# Patient Record
Sex: Male | Born: 1945
Health system: Southern US, Community
[De-identification: ages and names within clinical notes are randomized; demographics above are authoritative.]

## PROBLEM LIST (undated history)

## (undated) DIAGNOSIS — J449 Chronic obstructive pulmonary disease, unspecified: Secondary | ICD-10-CM

## (undated) DIAGNOSIS — N401 Enlarged prostate with lower urinary tract symptoms: Secondary | ICD-10-CM

## (undated) DIAGNOSIS — I739 Peripheral vascular disease, unspecified: Secondary | ICD-10-CM

## (undated) DIAGNOSIS — E785 Hyperlipidemia, unspecified: Secondary | ICD-10-CM

## (undated) DIAGNOSIS — N529 Male erectile dysfunction, unspecified: Secondary | ICD-10-CM

## (undated) DIAGNOSIS — F419 Anxiety disorder, unspecified: Secondary | ICD-10-CM

## (undated) DIAGNOSIS — T82519A Breakdown (mechanical) of unspecified cardiac and vascular devices and implants, initial encounter: Secondary | ICD-10-CM

## (undated) DIAGNOSIS — R0609 Other forms of dyspnea: Secondary | ICD-10-CM

## (undated) DIAGNOSIS — Z9861 Coronary angioplasty status: Principal | ICD-10-CM

## (undated) DIAGNOSIS — F411 Generalized anxiety disorder: Secondary | ICD-10-CM

## (undated) DIAGNOSIS — G4700 Insomnia, unspecified: Secondary | ICD-10-CM

## (undated) DIAGNOSIS — I69398 Other sequelae of cerebral infarction: Secondary | ICD-10-CM

## (undated) DIAGNOSIS — I10 Essential (primary) hypertension: Secondary | ICD-10-CM

## (undated) DIAGNOSIS — R972 Elevated prostate specific antigen [PSA]: Secondary | ICD-10-CM

## (undated) DIAGNOSIS — R3912 Poor urinary stream: Secondary | ICD-10-CM

## (undated) DIAGNOSIS — F09 Unspecified mental disorder due to known physiological condition: Secondary | ICD-10-CM

## (undated) DIAGNOSIS — J439 Emphysema, unspecified: Secondary | ICD-10-CM

## (undated) DIAGNOSIS — F32A Depression, unspecified: Secondary | ICD-10-CM

## (undated) DIAGNOSIS — F329 Major depressive disorder, single episode, unspecified: Secondary | ICD-10-CM

## (undated) DIAGNOSIS — I2510 Atherosclerotic heart disease of native coronary artery without angina pectoris: Secondary | ICD-10-CM

## (undated) DIAGNOSIS — I639 Cerebral infarction, unspecified: Secondary | ICD-10-CM

## (undated) DIAGNOSIS — Z955 Presence of coronary angioplasty implant and graft: Secondary | ICD-10-CM

## (undated) DIAGNOSIS — I219 Acute myocardial infarction, unspecified: Secondary | ICD-10-CM

## (undated) DIAGNOSIS — M1990 Unspecified osteoarthritis, unspecified site: Secondary | ICD-10-CM

## (undated) HISTORY — DX: Atherosclerotic heart disease of native coronary artery without angina pectoris: I25.10

## (undated) HISTORY — DX: Hyperlipidemia, unspecified: E78.5

## (undated) HISTORY — PX: PROSTATE BIOPSY: SHX241

## (undated) HISTORY — DX: Chronic obstructive pulmonary disease, unspecified: J44.9

## (undated) HISTORY — DX: Peripheral vascular disease, unspecified: I73.9

## (undated) HISTORY — PX: OTHER SURGICAL HISTORY: SHX169

## (undated) HISTORY — DX: Essential (primary) hypertension: I10

## (undated) HISTORY — PX: BACK SURGERY: SHX140

## (undated) HISTORY — DX: Coronary angioplasty status: Z98.61

---

## 1996-09-04 HISTORY — PX: CATARACT EXTRACTION W/ INTRAOCULAR LENS IMPLANT: SHX1309

## 1997-09-04 DIAGNOSIS — I739 Peripheral vascular disease, unspecified: Secondary | ICD-10-CM

## 1997-09-04 HISTORY — DX: Peripheral vascular disease, unspecified: I73.9

## 1998-04-30 ENCOUNTER — Ambulatory Visit: Admission: RE | Admit: 1998-04-30 | Discharge: 1998-04-30 | Payer: Self-pay | Admitting: Vascular Surgery

## 1998-04-30 HISTORY — PX: ANGIOPLASTY / STENTING ILIAC: SUR31

## 1998-09-04 DIAGNOSIS — I739 Peripheral vascular disease, unspecified: Secondary | ICD-10-CM

## 1998-09-04 HISTORY — PX: EYE SURGERY: SHX253

## 1998-09-04 HISTORY — DX: Peripheral vascular disease, unspecified: I73.9

## 1999-09-05 HISTORY — PX: OTHER SURGICAL HISTORY: SHX169

## 2000-11-27 ENCOUNTER — Encounter: Payer: Self-pay | Admitting: Orthopedic Surgery

## 2000-11-27 ENCOUNTER — Encounter: Admission: RE | Admit: 2000-11-27 | Discharge: 2000-11-27 | Payer: Self-pay | Admitting: Orthopedic Surgery

## 2001-02-05 ENCOUNTER — Encounter: Payer: Self-pay | Admitting: Orthopedic Surgery

## 2001-02-05 ENCOUNTER — Encounter: Admission: RE | Admit: 2001-02-05 | Discharge: 2001-02-05 | Payer: Self-pay | Admitting: Orthopedic Surgery

## 2001-02-18 ENCOUNTER — Encounter: Payer: Self-pay | Admitting: Orthopedic Surgery

## 2001-02-18 ENCOUNTER — Encounter: Admission: RE | Admit: 2001-02-18 | Discharge: 2001-02-18 | Payer: Self-pay | Admitting: Orthopedic Surgery

## 2005-01-02 DIAGNOSIS — I252 Old myocardial infarction: Secondary | ICD-10-CM

## 2005-01-02 DIAGNOSIS — I2510 Atherosclerotic heart disease of native coronary artery without angina pectoris: Secondary | ICD-10-CM

## 2005-01-02 HISTORY — DX: Old myocardial infarction: I25.2

## 2005-01-02 HISTORY — DX: Atherosclerotic heart disease of native coronary artery without angina pectoris: I25.10

## 2005-01-09 ENCOUNTER — Inpatient Hospital Stay (HOSPITAL_COMMUNITY): Admission: EM | Admit: 2005-01-09 | Discharge: 2005-01-12 | Payer: Self-pay | Admitting: Emergency Medicine

## 2005-01-10 HISTORY — PX: CARDIAC CATHETERIZATION: SHX172

## 2005-01-10 HISTORY — PX: CORONARY ANGIOPLASTY WITH STENT PLACEMENT: SHX49

## 2005-03-31 ENCOUNTER — Encounter: Admission: RE | Admit: 2005-03-31 | Discharge: 2005-03-31 | Payer: Self-pay | Admitting: Cardiovascular Disease

## 2005-04-04 ENCOUNTER — Ambulatory Visit (HOSPITAL_COMMUNITY): Admission: RE | Admit: 2005-04-04 | Discharge: 2005-04-05 | Payer: Self-pay | Admitting: Cardiovascular Disease

## 2005-04-04 DIAGNOSIS — I2119 ST elevation (STEMI) myocardial infarction involving other coronary artery of inferior wall: Secondary | ICD-10-CM

## 2005-04-04 HISTORY — DX: ST elevation (STEMI) myocardial infarction involving other coronary artery of inferior wall: I21.19

## 2005-04-04 HISTORY — PX: OTHER SURGICAL HISTORY: SHX169

## 2007-06-03 HISTORY — PX: NM MYOCAR PERF EJECTION FRACTION: HXRAD630

## 2007-07-17 HISTORY — PX: CARDIAC CATHETERIZATION: SHX172

## 2007-07-17 HISTORY — PX: OTHER SURGICAL HISTORY: SHX169

## 2010-09-04 DIAGNOSIS — I6990 Unspecified sequelae of unspecified cerebrovascular disease: Secondary | ICD-10-CM

## 2010-09-04 DIAGNOSIS — I6930 Unspecified sequelae of cerebral infarction: Secondary | ICD-10-CM

## 2010-09-04 HISTORY — DX: Unspecified sequelae of unspecified cerebrovascular disease: I69.90

## 2010-09-04 HISTORY — DX: Unspecified sequelae of cerebral infarction: I69.30

## 2011-01-20 NOTE — Discharge Summary (Signed)
NAME:  Terry Kemp, Terry Kemp            ACCOUNT NO.:  [id number]   MEDICAL RECORD NO.:  [id number]          PATIENT TYPE:  INP   LOCATION:  2039                         FACILITY:  [facility]   PHYSICIAN:  Madaline Savage, M.D.DATE OF BIRTH:  [birth year]   DATE OF ADMISSION:  01/09/2005  DATE OF DISCHARGE:  01/12/2005                                 DISCHARGE SUMMARY   DISCHARGE DIAGNOSES:  1.  DMI treated with RCA Cypher stent this admission.  2.  Residual 50% left main lesion.  3.  Normal left ventricular function.  4.  Peripheral vascular disease with prior bilateral iliac stents placed by      Dr. Edilia Bo in 2000 and 2001 with total in-stent restenosis on the right      and 75% in-stent restenosis on the left.  5.  Dyslipidemia.  6.  Former smoker.   HOSPITAL COURSE:  The patient is a 65 year old male who was admitted to  [facility] by Dr. Effie Shy Jan 09, 2005 with an acute DMI.  He was  started on IV heparin, Integrilin, aspirin, and nitrates, and taken urgently  to the cath lab at 2:00 a.m. by Dr. gamble.  Catheterization revealed normal  renal arteries.  He had a total right iliac in-stent restenosis and a 75%  left iliac in-stent restenosis.  He had 50% mid-distal left main narrowing  with a normal LAD and circumflex.  The RCA was totaled mid-vessel.  This was  dilated and stented with a Cypher stent with good results.  EF was 60%.  His  enzymes peaked at 1941. He had some nonsustained V-tach which we felt was  reperfusion arrhythmia.  We did add a beta blocker, although he did have  bradycardia on admission.  He does tolerate his beta-blocker.  He was  transferred to the CCU and ambulated.  He was anxious for discharge on Jan 12, 2005.  He had been on lovastatin prior to admission.  His LDL was in the  50s but his HDL was 26 during this hospitalization. We initially wanted to  put him on niacin and change his lovastatin to Crestor.  He brought in some  lipid panels from  previous checks as an outpatient and his LDL was 140 and  his HDL was in the mid-40s. For now, we will leave him off Niaspan and just  put him on Crestor 20 mg a day and follow this up as an outpatient.  He will  also need an outpatient Cardiolite at some point to assess his left main  narrowing. The patient wants to contact Dr. Edilia Bo regarding his peripheral  vascular disease.   DISCHARGE MEDICATIONS:  1.  Pletal 50 mg b.i.d.  2.  Plavix 75 mg a day.  3.  Aspirin 81 mg a day.  4.  Crestor 20 mg a day.  5.  Altace 2.5 mg a day.  6.  Toprol XL 25 mg once a day.   LABORATORY DATA:  CKs peaked at 1941 with 231 MB.  Renal function shows a  sodium of 136, potassium 3.8, BUN 4, creatinine 0.9.  Hematology shows  a  white count of 8, hemoglobin 13.1, hematocrit 38.2, platelets 201,000.  TSH  1.24.  PSA 0.37.  A portable chest x-ray shows low lung volumes, no acute  findings.  INR 1.0.  EKG:  Sinus rhythm, inferior Q-waves and T-wave  inversion.   DISPOSITION:  The patient is discharged in stable condition.  He will follow  up with Dr. Elsie Lincoln Jan 27, 2005 at 4:15 p.m.  He will contact Dr. Adele Dan  office.      LKK/MEDQ  D:  01/12/2005  T:  01/12/2005  Job:  [id number]   cc:   Madaline Savage, M.D.  [address], [address]  [place]  [place] [address]  Fax: [phone]   Di Kindle. Edilia Bo, M.D.  [address]  [place]  [place] [address]   Chales Salmon. Abigail Miyamoto, M.D.  [address]  [place]  [place] [address]  Fax: [phone]

## 2011-01-20 NOTE — Discharge Summary (Signed)
NAME:  AZAREL, BANNER NO.:  [id number]   MEDICAL RECORD NO.:  [id number]          PATIENT TYPE:  INP   LOCATION:  2039                         FACILITY:  [facility]   PHYSICIAN:  Madaline Savage, M.D.DATE OF BIRTH:  [birth year]   DATE OF ADMISSION:  01/09/2005  DATE OF DISCHARGE:  01/12/2005                                 DISCHARGE SUMMARY   ADDENDUM:  At discharge,  Mr. Furness spoke with Dr. Alanda Amass.  Dr.  Alanda Amass suggested that the patient not go to his son's graduation at  [facility], but the patient had decided to go anyway. We also suggested  he have outpatient cardiac rehab, and the patient also declined this. Dr.  Alanda Amass did adjust his Toprol XL to half a tablet a day, that will be 12.5  mg a day.  Dr. Alanda Amass also talked with him about his peripheral vascular  disease, and the patient would like to see [place]. Dr. Alanda Amass will with Dr.  Edilia Bo regarding this. We will arrange him to have an outpatient carotid  artery Dopplers, Cardiolite, and lower extremity arterial Dopplers.      LKK/MEDQ  D:  01/12/2005  T:  01/12/2005  Job:  [id number]   cc:   Di Kindle. Edilia Bo, M.D.  [address]  [place]  [place] [address]

## 2011-01-20 NOTE — H&P (Signed)
NAME:  [name], [name] NO.:  [id number]   MEDICAL RECORD NO.:  [id number]          PATIENT TYPE:  INP   LOCATION:  1827                         FACILITY:  [facility]   PHYSICIAN:  Terry Amor, MD DATE OF BIRTH:  [birth year]   DATE OF ADMISSION:  01/09/2005  DATE OF DISCHARGE:                                HISTORY & PHYSICAL   REASON FOR ADMISSION:  Terry Kemp is a 65 year old white male who was  admitted to [facility] for an acute inferior myocardial  infarction.   HISTORY OF PRESENT ILLNESS:  The patient, who has no past history of cardiac  disease, presented to the emergency department with a three-hour history of  chest pain. This began while he was sedentary at home. The chest pain is  described as a substernal ache. It does not radiate. It is associated with  diaphoresis but no dyspnea or nausea. There were no exacerbating or  ameliorating factors. The chest pain appears to be unrelated position,  activity, meals, or respirations. Despite morphine and intravenous  nitroglycerin administered upon his arrival in the emergency department, his  chest pain continues at a severity of approximately 7 out of 10.   As noted, the patient has no past history of cardiac disease including no  history of chest pain, myocardial infarction, coronary artery disease,  congestive heart failure, or arrhythmia. He has a number of risk factors for  coronary artery disease including dyslipidemia and smoking (he discontinued  smoking several months ago). There was no history of diabetes mellitus,  hypertension, or family history of early coronary artery disease.   The patient's only other medical problem is that of peripheral vascular  disease. He underwent stent placement a number of years ago at what sounds  like the aortobifemoral junction.   MEDICATIONS:  Cilostazol and Lovastatin. He is also on aspirin.   ALLERGIES:  None.   PAST SURGICAL  HISTORY:  None.   INJURIES:  None.   SOCIAL HISTORY:  The patient is a retired Forensic scientist.   FAMILY HISTORY:  His father, who is deceased, has an unknown medical  history. His mother died of cancer.   REVIEW OF SYMPTOMS:  Cursory review of systems revealed no problems related  to his head, eyes, ears, nose, mouth, throat, lungs, gastrointestinal  system, genitourinary system, or extremities. There was no history of  neurologic or psychiatric disorder. There is no history of fever, chills, or  weight loss.   PHYSICAL EXAMINATION:  VITAL SIGNS:  Blood pressure 124/84, pulse 56 and  regular. The patient was a middle-aged white male in mild discomfort. He was  alert, oriented, appropriate, and responsive.  HEENT:  Normal.  NECK:  Without thyromegaly or adenopathy. Carotid pulses were palpable  bilaterally and without bruits.  CARDIOVASCULAR:  Normal S1 and S2. There was no S3, S4, murmur, rub, or  click. Cardiac rhythm is regular.  CHEST:  No chest wall tenderness was noted.  LUNGS:  Clear.  ABDOMEN:  Soft and nontender. There was no mass, hepatosplenomegaly, bruit,  distention, rebound, guarding, or rigidity. Bowel sounds were normal.  RECTAL:  Not performed as they were not pertinent to the reason for acute  care hospitalization.  GENITAL:  Not performed as they were not pertinent to the reason for acute  care hospitalization.  EXTREMITIES:  Without edema, deviation, or deformity. Radial and dorsalis  pedis pulses were palpable bilaterally.  NEUROLOGICAL:  Brief screening neurologic survey was unremarkable.   LABORATORY DATA:  The electrocardiogram revealed marked inferior ST segment  elevation with reciprocal ST segment depression elsewhere consistent with an  acute inferior myocardial infarction. The rhythm was a junctional  bradycardia. The chest radiograph had not yet been performed. Laboratory  studies were not available at the time of this dictation.    IMPRESSION:  1.  Acute inferior myocardial infarction.  2.  Peripheral vascular disease, status post stent.  3.  Dyslipidemia.   PLAN:  1.  Emergent cardiac catheterization. Dr. Madaline Kemp has been      notified.  2.  Intravenous heparin, intravenous Integrilin, intravenous nitroglycerin,      and aspirin in the interim.  3.  No beta blocker as the patient's heart rate has ranged from the high 30s      to the high 50s.      MSC/MEDQ  D:  01/10/2005  T:  01/10/2005  Job:  [id number]   cc:   Terry Kemp, M.D.  [address]  [place]  [place] [address]  Fax: [phone]

## 2011-01-20 NOTE — Cardiovascular Report (Signed)
NAME:  Terry Kemp, Terry Kemp            ACCOUNT NO.:  [id number]   MEDICAL RECORD NO.:  [id number]          PATIENT TYPE:  INP   LOCATION:  2922                         FACILITY:  [facility]   PHYSICIAN:  Madaline Savage, M.D.DATE OF BIRTH:  1945/12/30   DATE OF PROCEDURE:  01/10/2005  DATE OF DISCHARGE:                              CARDIAC CATHETERIZATION   EMERGENCY CARDIAC CATHETERIZATION/PERCUTANEOUS CORONARY INTERVENTION REPORT   PROCEDURES PERFORMED:  1.  Selective coronary angiography by Judkins technique.  2.  Retrograde left heart catheterization.  3.  Left ventricular angiography.  4.  Abdominal aortography with bifemoral runoff, percutaneous coronary      stenting of the mid right coronary artery.  5.  Left femoral Angio-Seal closure of the left femoral artery.   COMPLICATIONS:  None.   ENTRY SITE:  Attempted but unsuccessful right percutaneous femoral artery  approach with successful left femoral artery cannulation.   DYE USED:  Omnipaque.   MEDICATIONS GIVEN:  Intravenous nitroglycerin, Integrilin, heparin, and  fentanyl.   PATIENT PROFILE:  The patient is a 65 year old white married male who is a  medical patient of Dr. Henrine Screws who has treated hyperlipidemia and  known peripheral vascular obstructive disease status post bilateral iliac  stents in the year 2000 and 2001 by Dr. Edilia Bo.   The patient had a 2-week course of chest pain on and off and presented this  evening with chest pain, and was seen by the emergency room doctor and then  by Dr. Earnest Conroy, who was on call for [facility], for acute ST-segment elevation MI with marked bradycardia and AV  block. I met the patient in the cath lab, examined him and talked to him,  and then proceeded with diagnostic catheterization and then percutaneous  intervention.   RESULTS:  Pressures: Left ventricular pressure was approximately 100/6. LV  central aortic pressure approximately  100/70.   ANGIOGRAPHIC RESULTS:  1.  The patient's distal left main coronary artery contained a 50% stenosis      with a calcified plaque superiorly.  2.  The left anterior descending coronary artery gave rise to two fairly      large diagonal branches and the LAD itself was fairly small after second      diagonal. There were luminal irregularities throughout but no high-grade      lesions seen in the LAD and its branches. There was medium-sized      intermediate ramus branch coming off the left main which also appeared      fairly normal.  3.  There was a nondominant circumflex giving rise to one bifurcating but      small obtuse marginal branch and several atrial circumflex branches      which were also normal. There was noted to be collateral flow to the      distal right coronary artery from left-to-right collaterals. I am not      sure from which vessel.  4.  The right coronary artery was 100% occluded at the junction of the      proximal and mid RCA and that is where  the patient was 100% occluded.      There was basically no antegrade flow into the distal RCA.  5.  There was dye hang up in this RCA which identified it as the acute      infarct-related vessel.  6.  The left ventricular angiogram performed in a 35 degree RAO view with 0      degrees of caudal angulation showed moderate hypokinesis of the entire      inferior wall with the apex anterolateral wall and anterobasal wall all      contracted hyperdynamically and I would, therefore, give the qualitative      assessment of the EF to be about 60-70%. A trace of mitral report mitral      regurgitation was seen.  7.  Abdominal aortography failed to show any evidence of abdominal aorta      pathology. There was noted to be 100% occlusion of a right common iliac      stent at the in flow into the stent. There was also noted to be a 75% or      greater stenosis of the distal end of the left common iliac stent. There      was  good reconstitution of flow beyond the 75% stenosis, however. There      was a rich collateral bed supplying the right lower extremity beyond the      occluded stent on the right.  8.  Percutaneous intervention of the right coronary artery was accomplished      by using a 6-French right Judkins guide catheter without side holes. A      Patriot wire easily crossed a 100% occluded vessel and was brought to      rest distally. I then used a Maverick balloon to predilate the vessel      which was successful. I then stented the vessel using a 3.0 x 28 mm      Cypher stent, and then I postdilated the Cypher stent with a 3.25, 20      Quantum Maverick balloon. The result was that the 100% occlusion of this      vessel was reduced to 0% residual with TIMI flow initially being 0, and      finally being 3. The patient had some reperfusion arrhythmias which      included both sinus tachycardia that was transient and then runs of      idioventricular rhythm lasting almost 5 minutes but without a drop in      blood pressure. The patient's chest pain was moderate in the cath lab      and when he left the cath lab was nonexistent.   Because of some oozing from his left groin as result of Integrilin and  heparin, I decided to Angio-Seal him after performing right femoral  angiogram and found the insertion site of the sheath to be at a favorable  location for subsequent Angio-Seal placement.   The patient tolerated the above-named procedures very well and left the cath  lab with a blood pressure of 100 and no chest pain.   FINAL DIAGNOSIS:  1.  Acute ST elevation myocardial infarction, inferior in location.      1.  Bradycardia.      2.  AV block.      3.  Severe chest pain.  2.  Acute coronary syndrome for the past 2 weeks.  3.  Coronary artery disease.      1.  A  50% left main stenosis distal.     2.  A 100% mid RCA occlusion which was the infarct-related vessel.  4.  Peripheral vascular  obstructive disease.      1.  A 100% occlusion of the right common iliac stent.      2.  A 75% stenosis at the outflow of the left iliac.  5.  Good LV systolic function, ejection fraction of 60%.  6.  Successful stenting of the mid RCA with reduction of 100% lesion to 0%      residual.   PLAN:  The patient will be treated with intravenous Integrilin overnight and  for another 18 hours. He will have a total of 600 mg of Plavix given in the  next 4-6 hours. We will start him on a beta blocker assuming that his heart  rate  and rhythm remained stable and no further AV block is noted. The patient  will be a candidate for Cardiolite stress testing to evaluate the impact of  the left main in terms of causing ischemia. There will also need to be some  Doppler examinations performed to assess the extent of poor blood flow to  the lower extremities.      WHG/MEDQ  D:  01/10/2005  T:  01/10/2005  Job:  [id number]   cc:   Madaline Savage, M.D.  [address], [address]  [place]  [place] [address]  Fax: [phone]   Chales Salmon. Abigail Miyamoto, M.D.  [address]  [place]  [place] [address]  Fax: [phone]   [facility]   Di Kindle. Edilia Bo, M.D.  [address]  [place]  [place] [address]

## 2011-01-20 NOTE — Op Note (Signed)
NAME:  Terry Kemp, Terry Kemp            ACCOUNT NO.:  [id number]   MEDICAL RECORD NO.:  [id number]          PATIENT TYPE:  OIB   LOCATION:  6524                         FACILITY:  [facility]   PHYSICIAN:  Terry Kemp, M.D.DATE OF BIRTH:  [birth year]   DATE OF PROCEDURE:  04/04/2005  DATE OF DISCHARGE:                                 OPERATIVE REPORT   PROCEDURE:  Retrograde abdominal aortic catheterization, abdominal aortic  angiogram, midstream PA projection, bilateral iliac angiography, PA  projection, bilateral lower extremity runoff using digital subtraction  angiography, recanalization, totally occluded right common iliac stent, with  0.014-inch guidewire, subsequent upgrade and guide wire exteriorization  through right common femoral artery.  Bilateral iliac angiography, bilateral  iliac catheterization, bilateral iliac percutaneous transluminal angioplasty  and subsequent bilateral iliac kissing stents, right tibial trifurcation  peakhold angiography with completion iliac angiography, Plavix 150 mg  extra, weight-adjusted heparin.   INDICATIONS FOR PROCEDURE.:  Terry Kemp is a 65 year old married father-  of-one with no grandchildren and currently retired.  He is a prior smoker,  has hyperlipidemia and peripheral arterial disease.  He underwent bilateral  iliac stenting for symptomatic claudication by Terry Kemp in the year  2000.  He suffered an acute DMI on Jan 10, 2005, was in the emergency room  with less than 3 hours of onset of chest pain and had successful emergency  recanalization, PTCA and stenting by Terry Kemp with a 3.0/28 CYPHER stent  dilated with an upgrade to 3.25-mm balloon and well-preserved LV function  with only moderate hypokinesis.  CPKs rose to 1478 with 202 MB.  Catheterization was done through the left femoral access site at that time,  since the patient had total occlusion of his right common iliac stent and  high-grade 70% to 80% stenosis with ISR  of the left common iliac stent.  He  was referred for evaluation of symptomatic claudication.  He has R ABI of  0.67, L ABI of 0.92, elevated velocities in his LCIA and occluded RCIA on  duplex scanning of Jan 26, 2005.  The patient has more symptoms on the right  than the left and with Fontaine 2B claudication bilateral, right greater  than left.  He did have collaterals at prior angiography through lumbar and  pelvic and by history, it was not clear how long he had total occlusion, but  this was estimated to be probably greater than 1 year.  Procedure and risks  were discussed with the patient in detail and informed consent was obtained  to proceed with attempted recanalization and bilateral iliac intervention.  The patient was admitted as a same-day admission in the post absorptive  state.   DESCRIPTION OF PROCEDURE:  Both groins were prepped, and draped in the usual  manner; 1% Xylocaine was used for local anesthesia.  During the procedure,  the patient received intermittent IV Versed in divided doses, a total of 5  mg.  He also received fentanyl, intermittent, 25 mcg in divided doses for a  total of 75 mcg.  He received 150 mg of extra Plavix (on Plavix and aspirin  as an outpatient  and weight-adjusted heparin 4000 units), monitoring ACTs  after the diagnostic procedure was done.   The LCFA was entered with an anterior puncture using an 18 thin-walled Smart  needle and a 5-French short side-arm sheath was inserted.  A 5-French  pigtail was positioned in the abdominal aorta above the level of the renal  arteries and using DSA, abdominal angiogram was done at 20 mL, 20 mL per  second.  A second injection was done above the iliac bifurcation at the same  settings.  Bilateral lower extremity runoff was then done with DSA and step-  table imaging at 88 mL for 8 mL per second.  The patient tolerated the  diagnostic procedure well.  Arterial pressures were monitored throughout the   procedure and ranged 170-190 mmHg.  The patient was given with labetalol 10  mg IV during the procedure for pressure reduction.   He remained in sinus rhythm.   Abdominal aortic angiogram showed single patent renal arteries bilaterally,  no stenosis on the right and about 40% proximal stenosis on the left.  The  proximal SMA axis was intact, as was the celiac access seen in this view.  The infrarenal abdominal aorta had calcific, diffuse, moderate  atherosclerotic disease throughout.  The right common iliac was totally  occluded at its origin with a very small nipple in this area.  The  previously placed right common iliac stent was seen.  There was  reconstitution of the right external iliac at the right hypogastric via  pelvic and lumbar collaterals.   The left common iliac stent showed 75% to 80% narrowing concentrically and  segmentally throughout the stented area, ending before the left hypogastric.  The left distal CIA and left external iliac were tortuous.  The external  iliacs were intact bilaterally.   Superior/inferior epigastrics were intact bilaterally, as was the middle  sacral artery.   The profunda were intact bilaterally and appeared normal.  Right lower  extremity flow was delayed.  Both superficial femoral arteries were widely  patent and essentially smooth throughout their course including Hunter's  canal.   The popliteals were widely patent bilaterally with no significant stenosis.   The tibial trifurcations were widely patent and intact bilaterally.  There  was slow three-vessel runoff to the right foot later confirmed on  postoperative injection and good three-vessel flow to the left foot.   It was elected to proceed with attempted PPI in this setting.   A crossover catheter, 5-French, was positioned in the right common iliac  nipple area.  Initial attempts at crossing with a 0.035 Wholey wire and Glidewire were not successful and entering only the proximal  portion of the  occluded RCIA.  We then used a 0.014-inch coronary PT2 Light Guide Wire.  I  was then able to negotiate and cross the totally occluded R CIA in an  antegrade fashion through the crossover diagnostic catheter.  This was  positioned free in the right external iliac.  Using this as a guide, the R  CIA was then entered with an anterior puncture using an 18 thin-walled  needle.  A 6-French short Daig sidearm sheath was inserted.  The 0.014-inch  crossover wire was then directed into the 6-French right femoral sheath.  The sheath was then taken apart at the sidearm, the guidewire was  exteriorized and the cap put back on the sheath with only minimal blood  loss.  With this exteriorized 0.014-inch wire, we will able to thread a 4-  French end-hole catheter over the guidewire through the total occlusion  through the right femoral sheath.  The guidewire was then pulled back and  exchanged for a 0.035-inchWholey wire inserted on the right and a second  Wholey wire inserted on the left.  Bilateral 6-French long sidearm sheaths  were then inserted.  The right common iliac was dilated with a 5 x 3 Cordis  Powerflex balloon at 10-40 and 10-40.  The  two 7 mm x 3-cm Cordis Powerflex  balloons were then used for kissing-balloon inflations.  This was done at 8-  40 on the right and 10-40 on the left.  There was still significant residual  stenosis bilaterally.  After bilateral iliac PTA, bilateral kissing iliac  stenting was done with a 7 x 39-mm Cordis Genesis stent on the right and a 7  x 29 Cordis Genesis stent on the left.  They were positioned  fluoroscopically and simultaneously deployed at 6-40 bilaterally.  The left  was then dilated with 12-30, the right dilated to 6-30.  There was residual  stenosis beyond RCIA Ace stent, so a second overlapping 7 x 18-mm Cordis  Genesis stent was placed fluoroscopically, ending before the right  hypogastric and deployed at 6-30.  Further  dilatations were done at the  overlap at 6 atmospheres for 30 seconds.  The balloons were then pulled  back.  Bilateral iliac angiography was done to assess stent placement  through the sidearm sheaths in a retrograde fashion.  Essentially the whole  RCIA was covered with new stent distally and sandwiched stent in the  proximal and midportion.  The distal LCIA had no significant stenosis and  ended before the tortuous area before the left hypogastric.  There was  excellent flow bilaterally.  There was no iliac dissection on retrograde  injection.  Abdominal angiogram was then done through a pigtail catheter, 5-  [place], above the iliac bifurcation.  This demonstrated moderately severe  infrarenal atherosclerotic disease with eccentric plaque on the left.  The  inferior mesenteric was intact.  The lumbar collaterals and less-evident  middle sacral collateral was still seen.  The hypogastrics were intact bilaterally.  Lateral injection using DSA showed a mild 30% distal abdominal  aortic narrowing with eccentric plaque, but no dissection, intact  hypogastric and good visualization of the stented common iliacs.   Catheter were removed and through the right femoral sheath, a peakhold  injection was done of the right tibial trifurcation, demonstrating excellent  right tibial trifurcation with intact three-vessel runoff via the RPT, right  peroneal and right anterior tibial.   ACTs was performed and was 230 seconds, so the sheath were changed to short  6-French sidearm sheaths, which were secured, and the patient was  transferred to the holding area.  He will obtain sheath removal with  bilateral pressure hemostasis when ACT normalizes.  We recommend continued  aspirin and Plavix along with lipid-lowering therapy, continued no smoking  and blood pressure treatment as necessary.  Followup lower extremity  Dopplers and surveillance are recommended in the future.  He has a  relatively small  distal aorta which is diseased and small common iliacs  bilaterally and despite his good angiographic result at present, he is at  some risk of restenosis with residual 7-mm balloon dilatations of the  iliacs.   CATHETERIZATION DIAGNOSES:  1.  Peripheral arterial disease -- bilateral claudication, Fontaine 2B,      right greater than left, chronic, worsened last 1-2 years.  2.  Remote bilateral iliac stenting, 2000, with subsequent right common      iliac stent occlusion and in-stent restenosis, left common iliac, noted      at recent catheterization, May 2006.  3.  Acute diaphragmatic myocardial infarction treated with emergency      percutaneous transluminal coronary angioplasty and drug-eluting stenting      as outlined above, Jan 09, 2005, early reperfusion.  4.  Hyperlipidemia.  5.  Past cigarette abuse.  6.  Systemic hypertension.      Terry Kemp, M.D.  Electronically Signed     RAW/MEDQ  D:  04/04/2005  T:  04/05/2005  Job:  [id number]   cc:   Chales Salmon. Abigail Miyamoto, M.D.  [address]  [place]  [place] [address]  Fax: [phone]   Madaline Savage, M.D.  [phone] [address]  [place]  [place] [address]  Fax: [phone]   [facility]   Office of Terry A. Alanda Amass, MD Doppler Lab

## 2011-09-05 DIAGNOSIS — I639 Cerebral infarction, unspecified: Secondary | ICD-10-CM

## 2011-09-05 HISTORY — DX: Cerebral infarction, unspecified: I63.9

## 2012-01-16 ENCOUNTER — Other Ambulatory Visit: Payer: Self-pay | Admitting: Neurology

## 2012-01-16 DIAGNOSIS — I6350 Cerebral infarction due to unspecified occlusion or stenosis of unspecified cerebral artery: Secondary | ICD-10-CM

## 2012-03-20 ENCOUNTER — Other Ambulatory Visit: Payer: Self-pay

## 2012-03-22 ENCOUNTER — Ambulatory Visit
Admission: RE | Admit: 2012-03-22 | Discharge: 2012-03-22 | Disposition: A | Discharge status: Home / Self Care | Payer: PRIVATE HEALTH INSURANCE | Source: Ambulatory Visit | Attending: Neurology | Admitting: Neurology

## 2012-03-22 DIAGNOSIS — I6350 Cerebral infarction due to unspecified occlusion or stenosis of unspecified cerebral artery: Secondary | ICD-10-CM

## 2012-03-22 MED ORDER — GADOBENATE DIMEGLUMINE 529 MG/ML IV SOLN
13.0000 mL | Freq: Once | INTRAVENOUS | Status: AC | PRN
  Administered 2012-03-22: 13 mL via INTRAVENOUS

## 2013-07-22 DIAGNOSIS — I2510 Atherosclerotic heart disease of native coronary artery without angina pectoris: Secondary | ICD-10-CM | POA: Diagnosis not present

## 2013-07-22 DIAGNOSIS — F17200 Nicotine dependence, unspecified, uncomplicated: Secondary | ICD-10-CM | POA: Diagnosis not present

## 2013-07-22 DIAGNOSIS — E782 Mixed hyperlipidemia: Secondary | ICD-10-CM | POA: Diagnosis not present

## 2013-07-22 DIAGNOSIS — I10 Essential (primary) hypertension: Secondary | ICD-10-CM | POA: Diagnosis not present

## 2013-07-23 DIAGNOSIS — R300 Dysuria: Secondary | ICD-10-CM | POA: Diagnosis not present

## 2013-07-23 DIAGNOSIS — R3129 Other microscopic hematuria: Secondary | ICD-10-CM | POA: Diagnosis not present

## 2013-08-05 DIAGNOSIS — I70219 Atherosclerosis of native arteries of extremities with intermittent claudication, unspecified extremity: Secondary | ICD-10-CM | POA: Diagnosis not present

## 2013-08-05 DIAGNOSIS — M25559 Pain in unspecified hip: Secondary | ICD-10-CM | POA: Diagnosis not present

## 2013-08-05 DIAGNOSIS — I10 Essential (primary) hypertension: Secondary | ICD-10-CM | POA: Diagnosis not present

## 2013-08-11 DIAGNOSIS — I739 Peripheral vascular disease, unspecified: Secondary | ICD-10-CM | POA: Diagnosis not present

## 2013-08-11 DIAGNOSIS — M25559 Pain in unspecified hip: Secondary | ICD-10-CM | POA: Diagnosis not present

## 2013-08-11 DIAGNOSIS — Z9889 Other specified postprocedural states: Secondary | ICD-10-CM | POA: Diagnosis not present

## 2013-09-01 DIAGNOSIS — E782 Mixed hyperlipidemia: Secondary | ICD-10-CM | POA: Diagnosis not present

## 2013-09-01 DIAGNOSIS — I10 Essential (primary) hypertension: Secondary | ICD-10-CM | POA: Diagnosis not present

## 2013-09-01 DIAGNOSIS — IMO0002 Reserved for concepts with insufficient information to code with codable children: Secondary | ICD-10-CM | POA: Diagnosis not present

## 2013-09-26 DIAGNOSIS — M4310 Spondylolisthesis, site unspecified: Secondary | ICD-10-CM | POA: Diagnosis not present

## 2013-09-26 DIAGNOSIS — IMO0002 Reserved for concepts with insufficient information to code with codable children: Secondary | ICD-10-CM | POA: Diagnosis not present

## 2013-09-26 DIAGNOSIS — M5137 Other intervertebral disc degeneration, lumbosacral region: Secondary | ICD-10-CM | POA: Diagnosis not present

## 2013-10-20 DIAGNOSIS — M4310 Spondylolisthesis, site unspecified: Secondary | ICD-10-CM | POA: Diagnosis not present

## 2013-10-20 DIAGNOSIS — M5430 Sciatica, unspecified side: Secondary | ICD-10-CM | POA: Diagnosis not present

## 2013-10-20 DIAGNOSIS — M545 Low back pain, unspecified: Secondary | ICD-10-CM | POA: Diagnosis not present

## 2013-10-20 DIAGNOSIS — M48061 Spinal stenosis, lumbar region without neurogenic claudication: Secondary | ICD-10-CM | POA: Diagnosis not present

## 2013-11-18 DIAGNOSIS — M48062 Spinal stenosis, lumbar region with neurogenic claudication: Secondary | ICD-10-CM | POA: Diagnosis not present

## 2013-11-21 DIAGNOSIS — M48062 Spinal stenosis, lumbar region with neurogenic claudication: Secondary | ICD-10-CM | POA: Diagnosis not present

## 2013-11-24 DIAGNOSIS — M48062 Spinal stenosis, lumbar region with neurogenic claudication: Secondary | ICD-10-CM | POA: Diagnosis not present

## 2013-11-26 DIAGNOSIS — M48062 Spinal stenosis, lumbar region with neurogenic claudication: Secondary | ICD-10-CM | POA: Diagnosis not present

## 2013-11-28 DIAGNOSIS — M48062 Spinal stenosis, lumbar region with neurogenic claudication: Secondary | ICD-10-CM | POA: Diagnosis not present

## 2013-12-01 DIAGNOSIS — M48062 Spinal stenosis, lumbar region with neurogenic claudication: Secondary | ICD-10-CM | POA: Diagnosis not present

## 2013-12-11 DIAGNOSIS — M48062 Spinal stenosis, lumbar region with neurogenic claudication: Secondary | ICD-10-CM | POA: Diagnosis not present

## 2013-12-15 DIAGNOSIS — M48062 Spinal stenosis, lumbar region with neurogenic claudication: Secondary | ICD-10-CM | POA: Diagnosis not present

## 2013-12-19 DIAGNOSIS — M48062 Spinal stenosis, lumbar region with neurogenic claudication: Secondary | ICD-10-CM | POA: Diagnosis not present

## 2013-12-24 DIAGNOSIS — M5430 Sciatica, unspecified side: Secondary | ICD-10-CM | POA: Diagnosis not present

## 2013-12-24 DIAGNOSIS — M4310 Spondylolisthesis, site unspecified: Secondary | ICD-10-CM | POA: Diagnosis not present

## 2013-12-24 DIAGNOSIS — M48061 Spinal stenosis, lumbar region without neurogenic claudication: Secondary | ICD-10-CM | POA: Diagnosis not present

## 2014-02-06 DIAGNOSIS — I2510 Atherosclerotic heart disease of native coronary artery without angina pectoris: Secondary | ICD-10-CM | POA: Diagnosis not present

## 2014-02-06 DIAGNOSIS — E785 Hyperlipidemia, unspecified: Secondary | ICD-10-CM | POA: Diagnosis not present

## 2014-02-06 DIAGNOSIS — I10 Essential (primary) hypertension: Secondary | ICD-10-CM | POA: Diagnosis not present

## 2014-02-06 DIAGNOSIS — I252 Old myocardial infarction: Secondary | ICD-10-CM | POA: Diagnosis not present

## 2014-02-24 DIAGNOSIS — M48061 Spinal stenosis, lumbar region without neurogenic claudication: Secondary | ICD-10-CM | POA: Diagnosis not present

## 2014-02-24 DIAGNOSIS — M545 Low back pain, unspecified: Secondary | ICD-10-CM | POA: Diagnosis not present

## 2014-02-24 DIAGNOSIS — M4310 Spondylolisthesis, site unspecified: Secondary | ICD-10-CM | POA: Diagnosis not present

## 2014-03-04 DIAGNOSIS — D485 Neoplasm of uncertain behavior of skin: Secondary | ICD-10-CM | POA: Diagnosis not present

## 2014-03-19 DIAGNOSIS — C44621 Squamous cell carcinoma of skin of unspecified upper limb, including shoulder: Secondary | ICD-10-CM | POA: Diagnosis not present

## 2014-04-07 DIAGNOSIS — L723 Sebaceous cyst: Secondary | ICD-10-CM | POA: Diagnosis not present

## 2014-04-07 DIAGNOSIS — C4440 Unspecified malignant neoplasm of skin of scalp and neck: Secondary | ICD-10-CM | POA: Diagnosis not present

## 2014-04-07 DIAGNOSIS — C44519 Basal cell carcinoma of skin of other part of trunk: Secondary | ICD-10-CM | POA: Diagnosis not present

## 2014-04-07 DIAGNOSIS — L820 Inflamed seborrheic keratosis: Secondary | ICD-10-CM | POA: Diagnosis not present

## 2014-04-07 DIAGNOSIS — D485 Neoplasm of uncertain behavior of skin: Secondary | ICD-10-CM | POA: Diagnosis not present

## 2014-04-07 DIAGNOSIS — L570 Actinic keratosis: Secondary | ICD-10-CM | POA: Diagnosis not present

## 2014-04-14 DIAGNOSIS — C44529 Squamous cell carcinoma of skin of other part of trunk: Secondary | ICD-10-CM | POA: Diagnosis not present

## 2014-04-14 DIAGNOSIS — C4442 Squamous cell carcinoma of skin of scalp and neck: Secondary | ICD-10-CM | POA: Diagnosis not present

## 2014-04-16 ENCOUNTER — Encounter: Payer: Self-pay | Admitting: Nurse Practitioner

## 2014-04-16 DIAGNOSIS — C4442 Squamous cell carcinoma of skin of scalp and neck: Secondary | ICD-10-CM | POA: Diagnosis not present

## 2014-04-16 DIAGNOSIS — I739 Peripheral vascular disease, unspecified: Secondary | ICD-10-CM | POA: Insufficient documentation

## 2014-04-16 DIAGNOSIS — G463 Brain stem stroke syndrome: Secondary | ICD-10-CM

## 2014-04-16 DIAGNOSIS — C44529 Squamous cell carcinoma of skin of other part of trunk: Secondary | ICD-10-CM | POA: Diagnosis not present

## 2014-04-16 DIAGNOSIS — Z9861 Coronary angioplasty status: Secondary | ICD-10-CM

## 2014-04-16 DIAGNOSIS — I25119 Atherosclerotic heart disease of native coronary artery with unspecified angina pectoris: Secondary | ICD-10-CM | POA: Insufficient documentation

## 2014-04-16 DIAGNOSIS — C44519 Basal cell carcinoma of skin of other part of trunk: Secondary | ICD-10-CM | POA: Diagnosis not present

## 2014-04-16 DIAGNOSIS — I2510 Atherosclerotic heart disease of native coronary artery without angina pectoris: Secondary | ICD-10-CM | POA: Insufficient documentation

## 2014-04-21 DIAGNOSIS — Q762 Congenital spondylolisthesis: Secondary | ICD-10-CM | POA: Diagnosis not present

## 2014-04-21 DIAGNOSIS — M47817 Spondylosis without myelopathy or radiculopathy, lumbosacral region: Secondary | ICD-10-CM | POA: Diagnosis not present

## 2014-04-21 DIAGNOSIS — IMO0002 Reserved for concepts with insufficient information to code with codable children: Secondary | ICD-10-CM | POA: Diagnosis not present

## 2014-04-22 ENCOUNTER — Ambulatory Visit (INDEPENDENT_AMBULATORY_CARE_PROVIDER_SITE_OTHER): Payer: Medicare Other | Admitting: Nurse Practitioner

## 2014-04-22 ENCOUNTER — Encounter: Payer: Self-pay | Admitting: Nurse Practitioner

## 2014-04-22 VITALS — BP 153/83 | HR 72 | Ht 66.0 in | Wt 149.0 lb

## 2014-04-22 DIAGNOSIS — R4781 Slurred speech: Secondary | ICD-10-CM

## 2014-04-22 DIAGNOSIS — F05 Delirium due to known physiological condition: Secondary | ICD-10-CM

## 2014-04-22 DIAGNOSIS — G459 Transient cerebral ischemic attack, unspecified: Secondary | ICD-10-CM

## 2014-04-22 DIAGNOSIS — R4789 Other speech disturbances: Secondary | ICD-10-CM

## 2014-04-22 DIAGNOSIS — IMO0002 Reserved for concepts with insufficient information to code with codable children: Secondary | ICD-10-CM

## 2014-04-22 DIAGNOSIS — M6281 Muscle weakness (generalized): Secondary | ICD-10-CM | POA: Diagnosis not present

## 2014-04-22 DIAGNOSIS — R451 Restlessness and agitation: Secondary | ICD-10-CM

## 2014-04-22 DIAGNOSIS — F419 Anxiety disorder, unspecified: Secondary | ICD-10-CM | POA: Insufficient documentation

## 2014-04-22 DIAGNOSIS — F418 Other specified anxiety disorders: Secondary | ICD-10-CM

## 2014-04-22 DIAGNOSIS — R531 Weakness: Secondary | ICD-10-CM

## 2014-04-22 DIAGNOSIS — F341 Dysthymic disorder: Secondary | ICD-10-CM

## 2014-04-22 DIAGNOSIS — F329 Major depressive disorder, single episode, unspecified: Secondary | ICD-10-CM | POA: Insufficient documentation

## 2014-04-22 MED ORDER — SERTRALINE HCL 25 MG PO TABS: 25.0000 mg | ORAL_TABLET | Freq: Every day | ORAL | Status: DC

## 2014-04-22 MED ORDER — ASPIRIN-DIPYRIDAMOLE ER 25-200 MG PO CP12: 1.0000 | ORAL_CAPSULE | Freq: Two times a day (BID) | ORAL | Status: DC

## 2014-04-22 NOTE — Progress Notes (Signed)
PATIENT: Terry Kemp DOB: 1946-01-07  REASON FOR VISIT: routine follow up for stroke HISTORY FROM: patient  HISTORY OF PRESENT ILLNESS: Terry Kemp  is a 68 year old Caucasian male who developed gait ataxia, dizziness, leaning to the left while walking, dysphagia and transient vertigo as well as right body hemi-numbness in October 2012. He was evaluated at [facility] at [facility] and I have extensive records from there which I have reviewed. MRI scan of the brain did not show any acute stroke. Carotid ultrasound was unremarkable. 2-D echo did not show cardiac source of embolism. He was continued on aspirin and Plavix which he has been taking for his cardiac stent. He was also seen by a neurologist at [facility]  whose records are incomplete but as per the patient did not further contribute tto the understanding of his problem. He has moved to [facility] and is here for my opinion. He states his difficulty walking, lack of balance and dysphagia improved over several days but right hemibody and numbness has persisted though it is better. When tired he still has little bit of tingling and numbness in his hands as well as feet on the right side. He is tolerating his current medications without any side effects. He has not had any recurrent symptoms of stroke or TIAs since October last year.  He has quit smoking.  Update 04/22/14 (LL): Since last visit, he had recurrent symptoms of right arm weakness, slurred speech, and confusion which lasted approximately 2 minutes, while with friends. First occurrence was 01/01/14 and the next was approximately one month later. Since these episodes lasted a short time he did not seek medical treatment. He has been living in both [place] and [place], and is building a home here which is almost complete.  He will be living here now and renting his home in [place]. He states his blood pressure is well controlled although  his blood pressure in the office today is 153/83.  He states he has not taken his medication yet today. He restarted smoking. He had quit successfully before using Nicoderm patches. He states he has had a large activity change due to lower back pain which he plans to have surgery in November with Dr. Lorin Mercy.  He is tolerating Plavix daily without significant bleeding or bruising.  He still has right-sided hemisensory deficit which he says has improved since last visit. He states since stroke in 2013 he has become increasingly anxious and agitated at times which is not his typical personality. He states he is quick tempered now and has little patience.  He uses alprazolam 1 mg as needed mainly at bedtime to help him sleep. He states he has never used an SSRI or an SSRI.  REVIEW OF SYSTEMS: Full 14 system review of systems performed and notable only for:  numbness, weakness, agitation, depression, anxious .  ALLERGIES: No Known Allergies  HOME MEDICATIONS: Outpatient Prescriptions Prior to Visit  Medication Sig Dispense Refill  . amLODipine (NORVASC) 5 MG tablet Take 5 mg by mouth daily.      . clopidogrel (PLAVIX) 75 MG tablet Take 75 mg by mouth daily.      . Cyanocobalamin (VITAMIN B-12 CR PO) Take by mouth daily.      . metoprolol succinate (TOPROL-XL) 25 MG 24 hr tablet Take 25 mg by mouth daily.      . Multiple Vitamin (MULTIVITAMIN) tablet Take 1 tablet by mouth daily.      Marland Kitchen  Omega-3 Fatty Acids ([employer] FISH OIL) 1000 MG CAPS Take by mouth.      . ramipril (ALTACE) 5 MG capsule Take 5 mg by mouth daily.      Marland Kitchen aspirin 81 MG tablet Take 81 mg by mouth daily.      . Glucosamine HCl ([employer] GLUCOSAMINE) 1500 MG TABS Take by mouth daily.      . rosuvastatin (CRESTOR) 40 MG tablet Take 40 mg by mouth daily.       No facility-administered medications prior to visit.    PHYSICAL EXAM Filed Vitals:   04/22/14 1440  BP: 153/83  Pulse: 72  Height: 5\' 6"  (1.676 m)  Weight: 149 lb (67.586 kg)    Body mass index is 24.06 kg/(m^2).  Physical Exam  General: Pleasant  middle aged Caucasian male, in no distress.  Afebrile.   Head: nontraumatic Ears, Nose and Throat: Hearing is normal.  Neck: supple without bruit Respiratory: clear to auscultation Cardiovascular: no murmur or gallop Musculoskeletal:  no deformity Skin:  no rash  Neurologic Exam  Mental Status: Awake, alert and oriented to time, place and person.  Speech and language appear normal.   Cranial Nerves: Eye movements are full range without nystagmus.  Fundi revealed sharp disc margins without papilledema.  Visual fields are full to confrontational testing.  Face is symmetric without weakness.  Tongue is midline. Hearing is normal. Motor: reveals no upper or lower extremity drift.  Symmetric and equal strength in all four extremities.  No focal weakness. Sensory: Touch and pinprick sensations  are diminished on the right hemibody Coordination: normal Gait and Station: steady gait including tandem walking Reflexes: Deep tendon reflexes are 2+ symmetric.  Plantars are downgoing.    ASSESSMENT: 68 y.o. male who with sudden onset of gait ataxia, dysphagia, right-sided hemi-numbness in October 2012 likely a small brainstem infarct not visualized on MRI. Multiple vascular risk factors of hypertension, hyperlipidemia, smoking, CAD and peripheral vascular disease.  PLAN: I had a long discussion with the patient regarding his recent symptoms and answered questions.  Change from Plavix to Aggrenox for secondary stroke prevention with strict control of hypertension with blood pressure goal below 140/90, lipids with LDL cholesterol goal below 100 mg percent and I have advised him to quit smoking.  We will check an MRI of the brain, and an MRA of the head and neck.  Check CMP today. Start Sertraline 25 mg daily for anxiety and agitation, side effects were discussed. I have advised him to quit smoking.  Follow up with me in 2  months, sooner as needed.  Orders Placed This Encounter  Procedures  . MR Brain Wo Contrast  . MR MRA HEAD WO CONTRAST  . MR Angiogram Neck Wo Contrast  . Comprehensive metabolic panel   Meds ordered this encounter  Medications  . dipyridamole-aspirin (AGGRENOX) 200-25 MG per 12 hr capsule    Sig: Take 1 capsule by mouth 2 (two) times daily.    Dispense:  60 capsule    Refill:  5    Order Specific Question:  Supervising Provider    Answer:  Leonie Man, PRAMOD [[id number]]  . sertraline (ZOLOFT) 25 MG tablet    Sig: Take 1 tablet (25 mg total) by mouth at bedtime.    Dispense:  30 tablet    Refill:  3    Order Specific Question:  Supervising Provider    Answer:  Antony Contras [[id number]]   Return in about 2 months (around 06/22/2014) for stroke, TIA .  Rudi Rummage Tamon Parkerson, MSN, FNP-BC, A/GNP-C 04/22/2014, 4:04 PM [facility] [address] [place], [place] [address] [phone]  Note: This document was prepared with digital dictation and possible smart phrase technology. Any transcriptional errors that result from this process are unintentional.

## 2014-04-22 NOTE — Patient Instructions (Addendum)
Change from Plavix to Aggrenox for secondary stroke prevention with strict control of hypertension with blood pressure goal below 140/90, lipids with LDL cholesterol goal below 100 mg percent andd I have advised him to quit smoking.   We will check an MRI of the brain, and an MRA of the head and neck.  Someone will call you to schedule these appointments.   Follow up with Dr. Leonie Man in 3 months, sooner as needed.         Transient Ischemic Attack A transient ischemic attack (TIA) is a "warning stroke" that causes stroke-like symptoms. Unlike a stroke, a TIA does not cause permanent damage to the brain. The symptoms of a TIA can happen very fast and do not last long. It is important to know the symptoms of a TIA and what to do. This can help prevent a major stroke or death. CAUSES   A TIA is caused by a temporary blockage in an artery in the brain or neck (carotid artery). The blockage does not allow the brain to get the blood supply it needs and can cause different symptoms. The blockage can be caused by either:  A blood clot.  Fatty buildup (plaque) in a neck or brain artery. RISK FACTORS  High blood pressure (hypertension).  High cholesterol.  Diabetes mellitus.  Heart disease.  The build up of plaque in the blood vessels (peripheral artery disease or atherosclerosis).  The build up of plaque in the blood vessels providing blood and oxygen to the brain (carotid artery stenosis).  An abnormal heart rhythm (atrial fibrillation).  Obesity.  Smoking.  Taking oral contraceptives (especially in combination with smoking).  Physical inactivity.  A diet high in fats, salt (sodium), and calories.  Alcohol use.  Use of illegal drugs (especially cocaine and methamphetamine).  Being male.  Being African American.  Being over the age of 75.  Family history of stroke.  Previous history of blood clots, stroke, TIA, or heart attack.  Sickle cell disease. SYMPTOMS  TIA  symptoms are the same as a stroke but are temporary. These symptoms usually develop suddenly, or may be newly present upon awakening from sleep:  Sudden weakness or numbness of the face, arm, or leg, especially on one side of the body.  Sudden trouble walking or difficulty moving arms or legs.  Sudden confusion.  Sudden personality changes.  Trouble speaking (aphasia) or understanding.  Difficulty swallowing.  Sudden trouble seeing in one or both eyes.  Double vision.  Dizziness.  Loss of balance or coordination.  Sudden severe headache with no known cause.  Trouble reading or writing.  Loss of bowel or bladder control.  Loss of consciousness. DIAGNOSIS  Your caregiver may be able to determine the presence or absence of a TIA based on your symptoms, history, and physical exam. Computed tomography (CT scan) of the brain is usually performed to help identify a TIA. Other tests may be done to diagnose a TIA. These tests may include:  Electrocardiography.  Continuous heart monitoring.  Echocardiography.  Carotid ultrasonography.  Magnetic resonance imaging (MRI).  A scan of the brain circulation.  Blood tests. PREVENTION  The risk of a TIA can be decreased by appropriately treating high blood pressure, high cholesterol, diabetes, heart disease, and obesity and by quitting smoking, limiting alcohol, and staying physically active. TREATMENT  Time is of the essence. Since the symptoms of TIA are the same as a stroke, it is important to seek treatment as soon as possible because you may  need a medicine to dissolve the clot (thrombolytic) that cannot be given if too much time has passed. Treatment options vary. Treatment options may include rest, oxygen, intravenous (IV) fluids, and medicines to thin the blood (anticoagulants). Medicines and diet may be used to address diabetes, high blood pressure, and other risk factors. Measures will be taken to prevent short-term and  long-term complications, including infection from breathing foreign material into the lungs (aspiration pneumonia), blood clots in the legs, and falls. Treatment options include procedures to either remove plaque in the carotid arteries or dilate carotid arteries that have narrowed due to plaque. Those procedures are:  Carotid endarterectomy.  Carotid angioplasty and stenting. HOME CARE INSTRUCTIONS   Take all medicines prescribed by your caregiver. Follow the directions carefully. Medicines may be used to control risk factors for a stroke. Be sure you understand all your medicine instructions.  You may be told to take aspirin or the anticoagulant warfarin. Warfarin needs to be taken exactly as instructed.  Taking too much or too little warfarin is dangerous. Too much warfarin increases the risk of bleeding. Too little warfarin continues to allow the risk for blood clots. While taking warfarin, you will need to have regular blood tests to measure your blood clotting time. A PT blood test measures how long it takes for blood to clot. Your PT is used to calculate another value called an INR. Your PT and INR help your caregiver to adjust your dose of warfarin. The dose can change for many reasons. It is critically important that you take warfarin exactly as prescribed.  Many foods, especially foods high in vitamin K can interfere with warfarin and affect the PT and INR. Foods high in vitamin K include spinach, kale, broccoli, cabbage, collard and turnip greens, brussels sprouts, peas, cauliflower, seaweed, and parsley as well as beef and pork liver, green tea, and soybean oil. You should eat a consistent amount of foods high in vitamin K. Avoid major changes in your diet, or notify your caregiver before changing your diet. Arrange a visit with a dietitian to answer your questions.  Many medicines can interfere with warfarin and affect the PT and INR. You must tell your caregiver about any and all  medicines you take, this includes all vitamins and supplements. Be especially cautious with aspirin and anti-inflammatory medicines. Do not take or discontinue any prescribed or over-the-counter medicine except on the advice of your caregiver or pharmacist.  Warfarin can have side effects, such as excessive bruising or bleeding. You will need to hold pressure over cuts for longer than usual. Your caregiver or pharmacist will discuss other potential side effects.  Avoid sports or activities that may cause injury or bleeding.  Be mindful when shaving, flossing your teeth, or handling sharp objects.  Alcohol can change the body's ability to handle warfarin. It is best to avoid alcoholic drinks or consume only very small amounts while taking warfarin. Notify your caregiver if you change your alcohol intake.  Notify your dentist or other caregivers before procedures.  Eat a diet that includes 5 or more servings of fruits and vegetables each day. This may reduce the risk of stroke. Certain diets may be prescribed to address high blood pressure, high cholesterol, diabetes, or obesity.  A low-sodium, low-saturated fat, low-trans fat, low-cholesterol diet is recommended to manage high blood pressure.  A low-saturated fat, low-trans fat, low-cholesterol, and high-fiber diet may control cholesterol levels.  A controlled-carbohydrate, controlled-sugar diet is recommended to manage diabetes.  A reduced-calorie,  low-sodium, low-saturated fat, low-trans fat, low-cholesterol diet is recommended to manage obesity.  Maintain a healthy weight.  Stay physically active. It is recommended that you get at least 30 minutes of activity on most or all days.  Do not smoke.  Limit alcohol use even if you are not taking warfarin. Moderate alcohol use is considered to be:  No more than 2 drinks each day for men.  No more than 1 drink each day for nonpregnant women.  Stop drug abuse.  Home safety. A safe home  environment is important to reduce the risk of falls. Your caregiver may arrange for specialists to evaluate your home. Having grab bars in the bedroom and bathroom is often important. Your caregiver may arrange for equipment to be used at home, such as raised toilets and a seat for the shower.  Follow all instructions for follow-up with your caregiver. This is very important. This includes any referrals and lab tests. Proper follow up can prevent a stroke or another TIA from occurring. SEEK MEDICAL CARE IF:  You have personality changes.  You have difficulty swallowing.  You are seeing double.  You have dizziness.  You have a fever.  You have skin breakdown. SEEK IMMEDIATE MEDICAL CARE IF:  Any of these symptoms may represent a serious problem that is an emergency. Do not wait to see if the symptoms will go away. Get medical help right away. Call your local emergency services (911 in [place] Do not drive yourself to the hospital.  You have sudden weakness or numbness of the face, arm, or leg, especially on one side of the body.  You have sudden trouble walking or difficulty moving arms or legs.  You have sudden confusion.  You have trouble speaking (aphasia) or understanding.  You have sudden trouble seeing in one or both eyes.  You have a loss of balance or coordination.  You have a sudden, severe headache with no known cause.  You have new chest pain or an irregular heartbeat.  You have a partial or total loss of consciousness. MAKE SURE YOU:   Understand these instructions.  Will watch your condition.  Will get help right away if you are not doing well or get worse. Document Released: 05/31/2005 Document Revised: 08/26/2013 Document Reviewed: 11/26/2013 [facility] Patient Information 2015 [place], [place]. This information is not intended to replace advice given to you by your health care provider. Make sure you discuss any questions you have with your health care  provider.

## 2014-04-23 LAB — COMPREHENSIVE METABOLIC PANEL
ALT: 20 IU/L (ref 0–44)
AST: 15 IU/L (ref 0–40)
Albumin/Globulin Ratio: 2.1 (ref 1.1–2.5)
Albumin: 4.6 g/dL (ref 3.6–4.8)
Alkaline Phosphatase: 98 IU/L (ref 39–117)
BUN/Creatinine Ratio: 9 — ABNORMAL LOW (ref 10–22)
BUN: 7 mg/dL — ABNORMAL LOW (ref 8–27)
CO2: 23 mmol/L (ref 18–29)
Calcium: 9.4 mg/dL (ref 8.6–10.2)
Chloride: 92 mmol/L — ABNORMAL LOW (ref 97–108)
Creatinine, Ser: 0.75 mg/dL — ABNORMAL LOW (ref 0.76–1.27)
GFR calc Af Amer: 111 mL/min/{1.73_m2} (ref >59)
GFR calc non Af Amer: 96 mL/min/{1.73_m2} (ref >59)
Globulin, Total: 2.2 g/dL (ref 1.5–4.5)
Glucose: 86 mg/dL (ref 65–99)
Potassium: 4.2 mmol/L (ref 3.5–5.2)
Sodium: 134 mmol/L (ref 134–144)
Total Bilirubin: 0.3 mg/dL (ref 0.0–1.2)
Total Protein: 6.8 g/dL (ref 6.0–8.5)

## 2014-04-23 NOTE — Progress Notes (Signed)
I agree with the above plan 

## 2014-04-29 ENCOUNTER — Ambulatory Visit
Admission: RE | Admit: 2014-04-29 | Discharge: 2014-04-29 | Disposition: A | Discharge status: Home / Self Care | Payer: Medicare Other | Source: Ambulatory Visit | Attending: Nurse Practitioner | Admitting: Nurse Practitioner

## 2014-04-29 DIAGNOSIS — R4781 Slurred speech: Secondary | ICD-10-CM

## 2014-04-29 DIAGNOSIS — R531 Weakness: Secondary | ICD-10-CM

## 2014-04-29 DIAGNOSIS — G459 Transient cerebral ischemic attack, unspecified: Secondary | ICD-10-CM

## 2014-04-29 DIAGNOSIS — F05 Delirium due to known physiological condition: Secondary | ICD-10-CM

## 2014-05-01 ENCOUNTER — Other Ambulatory Visit: Payer: PRIVATE HEALTH INSURANCE

## 2014-05-03 ENCOUNTER — Telehealth: Payer: Self-pay

## 2014-05-03 NOTE — Telephone Encounter (Signed)
[employer] notified us no prior Josem Kaufmann is required for Aggrenox, and it is covered under the current benefit plan.  Ref ID # [id number]

## 2014-05-05 ENCOUNTER — Telehealth: Payer: Self-pay | Admitting: Neurology

## 2014-05-05 NOTE — Telephone Encounter (Signed)
Called Terry Kemp and discussed results of MRI/MRAs, shows remote stroke which was not present on previous scans in 2012.  He is to continue current treatment (Aggrenox bid) and keep follow up appt. He agreed.  IMPRESSION: Unremarkable MRA of neck wo contrast showing no significant stenosis at either carotid bifurcations.  IMPRESSION: Unremarkable MRA brain without evidence of significant large vessel stenosis. 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   IMPRESSION: Abnormal MRI brain showing remote age left corona radiata infarct and mild changes of chronic microvascular ischemia.

## 2014-05-05 NOTE — Telephone Encounter (Signed)
Patient calling to state that he already did his MRIs and MRAs and is requesting a call back before he leaves town for a couple of weeks starting tomorrow. Please call and advise patient.

## 2014-05-05 NOTE — Telephone Encounter (Signed)
I ordered new MRI and MRA because he was having new recurrent symptoms.  Last scans were done in 2013. It is up to him if he wants to have them done, if he is stable, treatment will not change.

## 2014-06-15 DIAGNOSIS — I2510 Atherosclerotic heart disease of native coronary artery without angina pectoris: Secondary | ICD-10-CM | POA: Diagnosis not present

## 2014-06-15 DIAGNOSIS — G459 Transient cerebral ischemic attack, unspecified: Secondary | ICD-10-CM | POA: Diagnosis not present

## 2014-06-15 DIAGNOSIS — I70219 Atherosclerosis of native arteries of extremities with intermittent claudication, unspecified extremity: Secondary | ICD-10-CM | POA: Diagnosis not present

## 2014-06-15 DIAGNOSIS — I252 Old myocardial infarction: Secondary | ICD-10-CM | POA: Diagnosis not present

## 2014-06-29 ENCOUNTER — Telehealth: Payer: Self-pay | Admitting: Nurse Practitioner

## 2014-06-29 ENCOUNTER — Ambulatory Visit: Payer: Medicare Other | Admitting: Nurse Practitioner

## 2014-06-29 NOTE — Telephone Encounter (Signed)
Patient was no show for today's office appointment.  

## 2014-07-01 ENCOUNTER — Encounter: Payer: Self-pay | Admitting: *Deleted

## 2014-10-07 DIAGNOSIS — M545 Low back pain: Secondary | ICD-10-CM | POA: Diagnosis not present

## 2014-10-07 DIAGNOSIS — M4317 Spondylolisthesis, lumbosacral region: Secondary | ICD-10-CM | POA: Diagnosis not present

## 2014-10-07 DIAGNOSIS — I10 Essential (primary) hypertension: Secondary | ICD-10-CM | POA: Diagnosis not present

## 2014-10-12 ENCOUNTER — Other Ambulatory Visit: Payer: Self-pay | Admitting: Neurological Surgery

## 2014-10-16 ENCOUNTER — Telehealth: Payer: Self-pay | Admitting: *Deleted

## 2014-10-16 DIAGNOSIS — F419 Anxiety disorder, unspecified: Secondary | ICD-10-CM | POA: Diagnosis not present

## 2014-10-16 DIAGNOSIS — E780 Pure hypercholesterolemia: Secondary | ICD-10-CM | POA: Diagnosis not present

## 2014-10-16 DIAGNOSIS — I679 Cerebrovascular disease, unspecified: Secondary | ICD-10-CM | POA: Diagnosis not present

## 2014-10-16 DIAGNOSIS — I2510 Atherosclerotic heart disease of native coronary artery without angina pectoris: Secondary | ICD-10-CM | POA: Diagnosis not present

## 2014-10-16 DIAGNOSIS — I10 Essential (primary) hypertension: Secondary | ICD-10-CM | POA: Diagnosis not present

## 2014-10-16 NOTE — Telephone Encounter (Signed)
SPOKE TO PATIENT  HE IS AWARE ABOUT UPCOMING APPOINTMTENT. PATIENT STATES HE IS RETURNING [facility] [place] HE HAS BEEN THERE  4 YEARS.

## 2014-10-16 NOTE — Telephone Encounter (Signed)
Left message to call back Dr Ellyn Hack received clearance form--  For surgery by Dr Ellene Route Per Dr Steva Colder needs an office appointment before clearance- last office visit was 6/28/ 2012. Awaiting return call.

## 2014-10-19 ENCOUNTER — Ambulatory Visit: Payer: Medicare Other | Admitting: Cardiology

## 2014-10-22 ENCOUNTER — Encounter: Payer: Self-pay | Admitting: *Deleted

## 2014-10-23 ENCOUNTER — Telehealth: Payer: Self-pay | Admitting: *Deleted

## 2014-10-23 NOTE — Telephone Encounter (Signed)
LEFT MESSAGE PATIENT NEED A OFFICE APPOINTMENT PRIOR  TO CARDIAC CLEARANCE APPT SCHEDULE 11/06/14

## 2014-10-30 ENCOUNTER — Encounter (HOSPITAL_COMMUNITY)
Admission: RE | Admit: 2014-10-30 | Discharge: 2014-10-30 | Disposition: A | Discharge status: Home / Self Care | Payer: Medicare Other | Source: Ambulatory Visit | Attending: Neurological Surgery | Admitting: Neurological Surgery

## 2014-10-30 ENCOUNTER — Encounter (HOSPITAL_COMMUNITY): Payer: Self-pay

## 2014-10-30 DIAGNOSIS — M5416 Radiculopathy, lumbar region: Secondary | ICD-10-CM | POA: Diagnosis not present

## 2014-10-30 DIAGNOSIS — M4317 Spondylolisthesis, lumbosacral region: Secondary | ICD-10-CM | POA: Diagnosis not present

## 2014-10-30 DIAGNOSIS — I10 Essential (primary) hypertension: Secondary | ICD-10-CM | POA: Diagnosis not present

## 2014-10-30 DIAGNOSIS — Z01812 Encounter for preprocedural laboratory examination: Secondary | ICD-10-CM | POA: Diagnosis not present

## 2014-10-30 DIAGNOSIS — M4806 Spinal stenosis, lumbar region: Secondary | ICD-10-CM | POA: Diagnosis not present

## 2014-10-30 HISTORY — DX: Anxiety disorder, unspecified: F41.9

## 2014-10-30 HISTORY — DX: Unspecified osteoarthritis, unspecified site: M19.90

## 2014-10-30 HISTORY — DX: Cerebral infarction, unspecified: I63.9

## 2014-10-30 LAB — CBC
HCT: 45.5 % (ref 39.0–52.0)
Hemoglobin: 16 g/dL (ref 13.0–17.0)
MCH: 31.3 pg (ref 26.0–34.0)
MCHC: 35.2 g/dL (ref 30.0–36.0)
MCV: 89 fL (ref 78.0–100.0)
PLATELETS: 243 10*3/uL (ref 150–400)
RBC: 5.11 MIL/uL (ref 4.22–5.81)
RDW: 12.5 % (ref 11.5–15.5)
WBC: 7.2 10*3/uL (ref 4.0–10.5)

## 2014-10-30 LAB — BASIC METABOLIC PANEL
Anion gap: 7 (ref 5–15)
BUN: 6 mg/dL (ref 6–23)
CALCIUM: 8.7 mg/dL (ref 8.4–10.5)
CO2: 28 mmol/L (ref 19–32)
CREATININE: 0.87 mg/dL (ref 0.50–1.35)
Chloride: 98 mmol/L (ref 96–112)
GFR calc non Af Amer: 88 mL/min — ABNORMAL LOW (ref >90)
Glucose, Bld: 123 mg/dL — ABNORMAL HIGH (ref 70–99)
Potassium: 3.8 mmol/L (ref 3.5–5.1)
Sodium: 133 mmol/L — ABNORMAL LOW (ref 135–145)

## 2014-10-30 LAB — ABO/RH: ABO/RH(D): O POS

## 2014-10-30 LAB — TYPE AND SCREEN
ABO/RH(D): O POS
ANTIBODY SCREEN: NEGATIVE

## 2014-10-30 LAB — SURGICAL PCR SCREEN
MRSA, PCR: NEGATIVE
Staphylococcus aureus: NEGATIVE

## 2014-10-30 NOTE — Pre-Procedure Instructions (Addendum)
Terry Kemp  10/30/2014   Your procedure is scheduled on:  Monday, March 7th   Report to [facility] Admitting at 6:30 AM.             (Arrival time is per your surgeon's request)   Call this number if you have problems the morning of surgery: [phone]   Remember:   Do not eat food or drink liquids after midnight Sunday.   Take these medicines the morning of surgery with A SIP OF WATER: Xanax, Amlodipine, Metoprolol      STOP all herbel meds, nsaids (aleve,naproxen,advil,ibuprofen) 5 days prior to surgery 11/04/14) including vitamins, fish oil, aspirin,  PLAVIX pre dr   Do not wear jewelry - no rings or watches.  Do not wear lotions or colognes.    You may NOT wear deodorant the day of surgery.   Men may shave face and neck.   Do not bring valuables to the hospital.  [facility] is not responsible for any belongings or valuables.               Contacts, dentures or bridgework may not be worn into surgery.  Leave suitcase in the car. After surgery it may be brought to your room.  For patients admitted to the hospital, discharge time is determined by your treatment team.    Name and phone number of your driver:    Special Instructions: Special Instructions: [place] - Preparing for Surgery  Before surgery, you can play an important role.  Because skin is not sterile, your skin needs to be as free of germs as possible.  You can reduce the number of germs on you skin by washing with CHG (chlorahexidine gluconate) soap before surgery.  CHG is an antiseptic cleaner which kills germs and bonds with the skin to continue killing germs even after washing.  Please DO NOT use if you have an allergy to CHG or antibacterial soaps.  If your skin becomes reddened/irritated stop using the CHG and inform your nurse when you arrive at Short Stay.  Do not shave (including legs and underarms) for at least 48 hours prior to the first CHG shower.  You may shave your face.  Please  follow these instructions carefully:   1.  Shower with CHG Soap the night before surgery and the morning of Surgery.  2.  If you choose to wash your hair, wash your hair first as usual with your normal shampoo.  3.  After you shampoo, rinse your hair and body thoroughly to remove the Shampoo.  4.  Use CHG as you would any other liquid soap.  You can apply chg directly  to the skin and wash gently with scrungie or a clean washcloth.  5.  Apply the CHG Soap to your body ONLY FROM THE NECK DOWN.  Do not use on open wounds or open sores.  Avoid contact with your eyes ears, mouth and genitals (private parts).  Wash genitals (private parts)       with your normal soap.  6.  Wash thoroughly, paying special attention to the area where your surgery will be performed.  7.  Thoroughly rinse your body with warm water from the neck down.  8.  DO NOT shower/wash with your normal soap after using and rinsing off the CHG Soap.  9.  Pat yourself dry with a clean towel.            10 .  Wear clean pajamas.  11.  Place clean sheets on your bed the night of your first shower and do not sleep with pets.  Day of Surgery  Do not apply any lotions/deodorants the morning of surgery.  Please wear clean clothes to the hospital/surgery center.   Please read over the following fact sheets that you were given: Pain Booklet, Coughing and Deep Breathing, Blood Transfusion Information, MRSA Information and Surgical Site Infection Prevention

## 2014-11-02 NOTE — Progress Notes (Addendum)
Anesthesia Chart Review:  Patient is a 69 year old male scheduled for L5-S1 PLIF on 11/09/14 by Dr. Ellene Route.  History includes smoking, HTN, HLD, CAD/inferior MI s/p Cypher stent to mid RCA 01/2005, PVD s/p bilateral CIA stents, anxiety, arthritis, CVA '12 (presented with sudden onset gait ataxia, dysphagia, right-sided hemi-numbness in October 2012 likely a small brainstem infarct not visualized on MRI in [place]). PCP in [place] is Dr. Juanetta Snow. PCP in [place] is listed as Dr. Melinda Crutch.   He apparently sees a cardiologist Dr. Valentina Shaggy at [place] and also Dr. Ellyn Hack who is now with [facility] here in [place]. Dr. Clarice Pole office requested cardiac clearance from both cardiologists.  Dr. Vashti Hey signed a note of cardiac clearance earlier this month, although his last office visit there was on 06/15/14.  According to his note: - Carotid U/S 06/2011: Mild plaque. - Echo 06/2011: Normal LVF, EF 60%, impaired relaxation.  Patient is scheduled to see Cecilie Kicks, NP with Dr. Shaunta Oncale [facility] office on 11/04/14.  Meds include Xanax, amlodipine, ASA 325mg , atorvastatin, Plavix, Topril XL, Nicoderm CQ, fish oil, ramipril. Per his PAT RN, patient reported he will clarify at his cardiology appointment this week when he should hold Plavix.  I also left a voice message with Janett Billow at Dr. Clarice Pole office, so she could follow-up with patient since they will likely need him off Plavix for at least five days prior to surgery. Dr. Clarice Pole office had already notified Dr. Vashti Hey that patient would need to hold Plavix for surgery.   10/30/14 EKG: NSR, possible anterior infarct (age undetermined). R wave in V3 is lower when compared to 11/28/10 EKG.   07/17/07 cardiac cath: 30-40% distal LM, normal but small LAD, two diagonal branches, CX, and INT RAMUS. Large RCA with huge PLA. Widely patent mid RCA stent but just at the end of the stent there was an area of 80% stenosis in the distal RCA  that was reduced to 30-40% with intracoronary nitroglycerine (spasm suspected). LVEF 63%.  Preoperative labs noted.   Chart will be left for follow-up regarding any additional cardiology recommendations from his appointment scheduled for 11/04/14.   George Hugh [facility] Short Stay Center/Anesthesiology Phone [phone] 11/02/2014 12:16 PM  Addendum: Patient was seen by Kerin Ransom, PA-C with Dr. Ellyn Hack on 11/04/14.  Nuclear stress test was ordered and done today.  Results are not yet up in [place], but Jessica from Dr. Clarice Pole office called and stated that Ivin Booty from Dr. Trevaughn Schear [facility] office just called her to confirm that patient was going to be cleared for surgery.  They are planning to fax a letter to Dr. Clarice Pole office.  Final stress test results should be in Epic by Monday morning. His assigned anesthesiologist can follow-up on report.  LVEF was 60%.  George Hugh [facility] Short Stay Center/Anesthesiology Phone [phone] 11/06/2014 4:30 PM

## 2014-11-03 HISTORY — PX: SPINAL FUSION: SHX223

## 2014-11-03 HISTORY — PX: NM MYOVIEW LTD: HXRAD82

## 2014-11-04 ENCOUNTER — Encounter: Payer: Self-pay | Admitting: Cardiology

## 2014-11-04 ENCOUNTER — Ambulatory Visit (INDEPENDENT_AMBULATORY_CARE_PROVIDER_SITE_OTHER)
Admission: RE | Admit: 2014-11-04 | Discharge: 2014-11-04 | Disposition: A | Discharge status: Home / Self Care | Payer: Medicare Other | Source: Ambulatory Visit | Attending: Cardiology | Admitting: Cardiology

## 2014-11-04 ENCOUNTER — Telehealth (HOSPITAL_COMMUNITY): Payer: Self-pay

## 2014-11-04 ENCOUNTER — Ambulatory Visit (INDEPENDENT_AMBULATORY_CARE_PROVIDER_SITE_OTHER): Payer: Medicare Other | Admitting: Cardiology

## 2014-11-04 VITALS — BP 160/78 | HR 66 | Ht 66.0 in | Wt 143.8 lb

## 2014-11-04 DIAGNOSIS — Z720 Tobacco use: Secondary | ICD-10-CM | POA: Diagnosis not present

## 2014-11-04 DIAGNOSIS — E785 Hyperlipidemia, unspecified: Secondary | ICD-10-CM | POA: Diagnosis not present

## 2014-11-04 DIAGNOSIS — Z9861 Coronary angioplasty status: Secondary | ICD-10-CM

## 2014-11-04 DIAGNOSIS — F418 Other specified anxiety disorders: Secondary | ICD-10-CM

## 2014-11-04 DIAGNOSIS — F17200 Nicotine dependence, unspecified, uncomplicated: Secondary | ICD-10-CM | POA: Insufficient documentation

## 2014-11-04 DIAGNOSIS — I2510 Atherosclerotic heart disease of native coronary artery without angina pectoris: Secondary | ICD-10-CM | POA: Diagnosis not present

## 2014-11-04 DIAGNOSIS — Z87891 Personal history of nicotine dependence: Secondary | ICD-10-CM

## 2014-11-04 DIAGNOSIS — Z01818 Encounter for other preprocedural examination: Secondary | ICD-10-CM | POA: Diagnosis not present

## 2014-11-04 NOTE — Assessment & Plan Note (Signed)
Up to a pack a day

## 2014-11-04 NOTE — Assessment & Plan Note (Signed)
Bilat iliac stents in 2000, and 2006-Dr Scot Dock

## 2014-11-04 NOTE — Assessment & Plan Note (Signed)
Aug 2015

## 2014-11-04 NOTE — Assessment & Plan Note (Signed)
Recently changed from Crestor to Lipitor secondary to cough

## 2014-11-04 NOTE — Assessment & Plan Note (Signed)
Last functional study was 2008. He saw Dr Ellyn Hack 3 years ago, records pending

## 2014-11-04 NOTE — Progress Notes (Signed)
11/04/2014 Terry Kemp   1946-08-07  [id number]  Primary Physician  Terry Crutch, MD Primary Cardiologist: Terry Kemp  HPI:  69 y/o male with a history of remote inferior STEMI, Rx'd by Terry Kemp with an RCA DES. He had a nuclear stress in 2008 that was low risk. He moved to [facility] and has been back here intermittently since. He was followed by a cardiologist in [facility] but has not had any other functional studies. He has moved back here and needs back surgery which is scheduled for 11/09/14 with Terry Terry Kemp. He denies chest pain. His records from Terry Kemp visit and his cardiologist in [facility] have been requested.    Current Outpatient Prescriptions  Medication Sig Dispense Refill  . ALPRAZolam (XANAX) 1 MG tablet Take 0.5 mg by mouth as needed.     Marland Kitchen amLODipine (NORVASC) 5 MG tablet Take 5 mg by mouth daily.    Marland Kitchen aspirin 325 MG tablet Take 1,075 mg by mouth as needed. Prn tid    . atorvastatin (LIPITOR) 20 MG tablet Take 20 mg by mouth daily at 6 PM.     . clopidogrel (PLAVIX) 75 MG tablet Take 75 mg by mouth daily.    . Cyanocobalamin (VITAMIN B-12 CR PO) Take by mouth daily.    . metoprolol succinate (TOPROL-XL) 25 MG 24 hr tablet Take 25 mg by mouth daily.    . Multiple Vitamin (MULTIVITAMIN) tablet Take 1 tablet by mouth daily.    . nicotine (NICODERM CQ - DOSED IN MG/24 HOURS) 21 mg/24hr patch Place 21 mg onto the skin daily.    . Omega-3 Fatty Acids ([employer] FISH OIL) 1000 MG CAPS Take by mouth.    . ramipril (ALTACE) 10 MG capsule Take 10 mg by mouth daily.     . sertraline (ZOLOFT) 25 MG tablet Take 1 tablet (25 mg total) by mouth at bedtime. 30 tablet 3   No current facility-administered medications for this visit.    No Known Allergies  History   Social History  . Marital Status: Single    Spouse Name: N/A  . Number of Children: 2  . Years of Education: college   Occupational History  . Not on file.   Social History Main Topics  . Smoking status: Current Every Day Smoker  -- 1.00 packs/day for 38 years    Types: Cigarettes  . Smokeless tobacco: Not on file  . Alcohol Use: 8.4 oz/week    14 Glasses of wine per week  . Drug Use: No  . Sexual Activity: Not on file   Other Topics Concern  . Not on file   Social History Narrative     Review of Systems: General: negative for chills, fever, night sweats or weight changes.  Cardiovascular: negative for chest pain, dyspnea on exertion, edema, orthopnea, palpitations, paroxysmal nocturnal dyspnea or shortness of breath Dermatological: negative for rash Respiratory: negative for cough or wheezing Urologic: negative for hematuria Abdominal: negative for nausea, vomiting, diarrhea, bright red blood per rectum, melena, or hematemesis Neurologic: negative for visual changes, syncope, or dizziness All other systems reviewed and are otherwise negative except as noted above.    Blood pressure 160/78, pulse 66, height 5\' 6"  (1.676 m), weight 143 lb 12.8 oz (65.227 kg), SpO2 97 %.  General appearance: alert, cooperative and no distress Neck: no carotid bruit and no JVD Lungs: clear to auscultation bilaterally Heart: regular rate and rhythm Abdomen: soft, non-tender; bowel sounds normal; no masses,  no organomegaly  Extremities: no edema Skin: Skin color, texture, turgor normal. No rashes or lesions Neurologic: Grossly normal  EKG From 10/30/14 NSR  ASSESSMENT AND PLAN:   TIA (transient ischemic attack) Aug 2015   Peripheral vascular disease Bilat iliac stents in 2000, and 2006-Terry Dickson   Dyslipidemia Recently changed from Crestor to Lipitor secondary to cough   Smoker Up to a pack a day   CAD S/P RCA DES 2006 Last functional study was 2008. He saw Terry Kemp 3 years ago, records pending   Depression with anxiety .   PLAN  I discussed his case with Terry Mare Ferrari. He feels the pt should have a functional study before surgery. We'll arrange for a Myoview and CXR (long term smoker) before  surgery. He'll f/u with Terry Kemp in 3 months pending above tests.   I also had the opportunity to discuss this with Terry Kemp. He feels given the fact that the pt is symptom free,  that we would not hold up his back surgery unless his scan was high risk.   Terry Kemp KPA-C 11/04/2014 11:59 AM

## 2014-11-04 NOTE — Patient Instructions (Addendum)
Your physician recommends that you return for lab work at earliest convienence (Lipid)  A chest x-ray takes a picture of the organs and structures inside the chest, including the heart, lungs, and blood vessels (PA and Lateral). This test can show several things, including, whether the heart is enlarges; whether fluid is building up in the lungs; and whether pacemaker / defibrillator leads are still in place.  Your physician has requested that you have a lexiscan myoview tomorrow. For further information please visit [email or web address]. Please follow instruction sheet, as given.  Your physician recommends that you schedule a follow-up appointment in: 6-8 weeks with Dr. Ellyn Hack.

## 2014-11-04 NOTE — Telephone Encounter (Signed)
Encounter complete. 

## 2014-11-05 ENCOUNTER — Telehealth (HOSPITAL_COMMUNITY): Payer: Self-pay

## 2014-11-05 NOTE — Telephone Encounter (Signed)
Encounter complete. 

## 2014-11-06 ENCOUNTER — Ambulatory Visit (HOSPITAL_BASED_OUTPATIENT_CLINIC_OR_DEPARTMENT_OTHER)
Admission: RE | Admit: 2014-11-06 | Discharge: 2014-11-06 | Disposition: A | Discharge status: Home / Self Care | Payer: Medicare Other | Source: Ambulatory Visit | Attending: Cardiovascular Disease | Admitting: Cardiovascular Disease

## 2014-11-06 ENCOUNTER — Telehealth: Payer: Self-pay | Admitting: *Deleted

## 2014-11-06 DIAGNOSIS — Z01818 Encounter for other preprocedural examination: Secondary | ICD-10-CM

## 2014-11-06 DIAGNOSIS — Z8673 Personal history of transient ischemic attack (TIA), and cerebral infarction without residual deficits: Secondary | ICD-10-CM | POA: Insufficient documentation

## 2014-11-06 DIAGNOSIS — Z955 Presence of coronary angioplasty implant and graft: Secondary | ICD-10-CM | POA: Insufficient documentation

## 2014-11-06 DIAGNOSIS — I10 Essential (primary) hypertension: Secondary | ICD-10-CM

## 2014-11-06 DIAGNOSIS — I252 Old myocardial infarction: Secondary | ICD-10-CM | POA: Insufficient documentation

## 2014-11-06 DIAGNOSIS — I739 Peripheral vascular disease, unspecified: Secondary | ICD-10-CM

## 2014-11-06 DIAGNOSIS — F17210 Nicotine dependence, cigarettes, uncomplicated: Secondary | ICD-10-CM

## 2014-11-06 DIAGNOSIS — I2510 Atherosclerotic heart disease of native coronary artery without angina pectoris: Secondary | ICD-10-CM

## 2014-11-06 MED ORDER — AMINOPHYLLINE 25 MG/ML IV SOLN
75.0000 mg | Freq: Once | INTRAVENOUS | Status: AC
  Administered 2014-11-06: 75 mg via INTRAVENOUS

## 2014-11-06 MED ORDER — TECHNETIUM TC 99M SESTAMIBI GENERIC - CARDIOLITE
31.6000 | Freq: Once | INTRAVENOUS | Status: AC | PRN
  Administered 2014-11-06: 32 via INTRAVENOUS

## 2014-11-06 MED ORDER — TECHNETIUM TC 99M SESTAMIBI GENERIC - CARDIOLITE
9.9000 | Freq: Once | INTRAVENOUS | Status: AC | PRN
  Administered 2014-11-06: 10 via INTRAVENOUS

## 2014-11-06 MED ORDER — REGADENOSON 0.4 MG/5ML IV SOLN
0.4000 mg | Freq: Once | INTRAVENOUS | Status: AC
  Administered 2014-11-06: 0.4 mg via INTRAVENOUS

## 2014-11-06 NOTE — Procedures (Addendum)
[facility] [address] [place] [address] [phone]  Cardiology Nuclear Med Study  Terry Kemp is a 69 y.o. male     MRN : [id number]     DOB: September 20, 1945  Procedure Date: 11/06/2014  Nuclear Med Background Indication for Stress Test:  Surgical Clearance History:  CAD;MI;STENT/PTCA-2006;Last NUC MPI on 06/03/2007-ischemis;EF=59%;PVD; Cardiac Risk Factors: Hypertension, Lipids, PVD, Smoker and TIA  Symptoms:  Pt denies all symptoms at this time.   Nuclear Pre-Procedure Caffeine/Decaff Intake:  1:00am NPO After: 9:00am   IV Site: R Forearm  IV 0.9% NS with Angio Cath:  22g  Chest Size (in):  40" IV Started by: Rolene Course, RN  Height: 5\' 6"  (1.676 m)  Cup Size: n/a  BMI:  Body mass index is 23.09 kg/(m^2). Weight:  143 lb (64.864 kg)   Tech Comments:  n/a    Nuclear Med Study 1 or 2 day study: 1 day  Stress Test Type:  [place]  Order Authorizing Provider:  Glenetta Hew, MD   Resting Radionuclide: Technetium 59m Sestamibi  Resting Radionuclide Dose: 9.9 mCi   Stress Radionuclide:  Technetium 14m Sestamibi  Stress Radionuclide Dose: 31.6 mCi           Stress Protocol Rest HR: 62 Stress HR: 70  Rest BP: 196/105 Stress BP: 199/90  Exercise Time (min): n/a METS: n/a          Dose of Adenosine (mg):  n/a Dose of Lexiscan: 0.4 mg  Dose of Atropine (mg): n/a Dose of Dobutamine: n/a mcg/kg/min (at max HR)  Stress Test Technologist: Leane Para, CCT Nuclear Technologist: Imagene Riches, CNMT   Rest Procedure:  Myocardial perfusion imaging was performed at rest 45 minutes following the intravenous administration of Technetium 41m Sestamibi. Stress Procedure:  The patient received IV Lexiscan 0.4 mg over 15-seconds.  Technetium 11m Sestamibi injected IV at 30-seconds.  There were no significant changes with Lexiscan. The patient had complaints of a headache 50 min post Lexiscan infusion. Pt given 75 mg  IV aminophylline with resolution of headache symptoms.  Quantitative spect images were obtained after a 45 minute delay.  Transient Ischemic Dilatation (Normal <1.22):  0.92  QGS EDV:  80 ml QGS ESV:  32 ml LV Ejection Fraction: 60%       Rest ECG: NSR - Normal EKG  Stress ECG: No significant change from baseline ECG  QPS Raw Data Images:  Normal; no motion artifact; normal heart/lung ratio. Stress Images:  There is decreased uptake in the inferior wall. Rest Images:  There is decreased uptake in the inferior wall. Subtraction (SDS):  There is a fixed inferior defect that is most consistent with diaphragmatic attenuation.  Impression Exercise Capacity:  Lexiscan with no exercise. BP Response:  Normal blood pressure response. Clinical Symptoms:  No significant symptoms noted. ECG Impression:  No significant ST segment change suggestive of ischemia. Comparison with Prior Nuclear Study: No significant change from previous study  Overall Impression:  Low risk stress nuclear study Diaphragmatic attenuation vs small inferior scar w/o ischemia.  LV Wall Motion:  NL LV Function; NL Wall Motion   [name], MD  11/06/2014 6:02 PM

## 2014-11-06 NOTE — Telephone Encounter (Signed)
RN informed by PAM (RAD. TECH) PATIENT HAS HAD MYOVIEW TODAY. TEST WAS REVIEWED BY DR [facility] CARDIAC CLEARANCE  FOR SURGERY WITH DR Ellene Route  ON 11/09/14 GIVEN AND SIGNED.  Informed Janett Billow verbally and faxed information.

## 2014-11-08 MED ORDER — CEFAZOLIN SODIUM-DEXTROSE 2-3 GM-% IV SOLR
2.0000 g | INTRAVENOUS | Status: AC
  Administered 2014-11-09: 2 g via INTRAVENOUS
  Filled 2014-11-08: qty 50

## 2014-11-09 ENCOUNTER — Encounter (HOSPITAL_COMMUNITY): Payer: Self-pay | Admitting: *Deleted

## 2014-11-09 ENCOUNTER — Inpatient Hospital Stay (HOSPITAL_COMMUNITY)
Admission: RE | Admit: 2014-11-09 | Discharge: 2014-11-11 | DRG: 460 | Disposition: A | Discharge status: Home / Self Care | Payer: Medicare Other | Source: Ambulatory Visit | Attending: Neurological Surgery | Admitting: Neurological Surgery

## 2014-11-09 ENCOUNTER — Inpatient Hospital Stay (HOSPITAL_COMMUNITY): Payer: Medicare Other | Admitting: Vascular Surgery

## 2014-11-09 ENCOUNTER — Inpatient Hospital Stay (HOSPITAL_COMMUNITY): Payer: Medicare Other | Admitting: Anesthesiology

## 2014-11-09 ENCOUNTER — Encounter (HOSPITAL_COMMUNITY)
Admission: RE | Disposition: A | Discharge status: Home / Self Care | Payer: Medicare Other | Source: Ambulatory Visit | Attending: Neurological Surgery

## 2014-11-09 ENCOUNTER — Inpatient Hospital Stay (HOSPITAL_COMMUNITY): Payer: Medicare Other

## 2014-11-09 DIAGNOSIS — M4806 Spinal stenosis, lumbar region: Secondary | ICD-10-CM | POA: Diagnosis present

## 2014-11-09 DIAGNOSIS — I10 Essential (primary) hypertension: Secondary | ICD-10-CM | POA: Diagnosis present

## 2014-11-09 DIAGNOSIS — I739 Peripheral vascular disease, unspecified: Secondary | ICD-10-CM | POA: Diagnosis present

## 2014-11-09 DIAGNOSIS — I2510 Atherosclerotic heart disease of native coronary artery without angina pectoris: Secondary | ICD-10-CM | POA: Diagnosis present

## 2014-11-09 DIAGNOSIS — M4317 Spondylolisthesis, lumbosacral region: Secondary | ICD-10-CM | POA: Diagnosis not present

## 2014-11-09 DIAGNOSIS — Z955 Presence of coronary angioplasty implant and graft: Secondary | ICD-10-CM | POA: Diagnosis not present

## 2014-11-09 DIAGNOSIS — F17210 Nicotine dependence, cigarettes, uncomplicated: Secondary | ICD-10-CM | POA: Diagnosis present

## 2014-11-09 DIAGNOSIS — M5416 Radiculopathy, lumbar region: Secondary | ICD-10-CM | POA: Diagnosis present

## 2014-11-09 DIAGNOSIS — Z01818 Encounter for other preprocedural examination: Secondary | ICD-10-CM

## 2014-11-09 DIAGNOSIS — Z7902 Long term (current) use of antithrombotics/antiplatelets: Secondary | ICD-10-CM | POA: Diagnosis not present

## 2014-11-09 DIAGNOSIS — Z981 Arthrodesis status: Secondary | ICD-10-CM | POA: Diagnosis not present

## 2014-11-09 DIAGNOSIS — I252 Old myocardial infarction: Secondary | ICD-10-CM | POA: Diagnosis not present

## 2014-11-09 DIAGNOSIS — Z419 Encounter for procedure for purposes other than remedying health state, unspecified: Secondary | ICD-10-CM

## 2014-11-09 DIAGNOSIS — Z79899 Other long term (current) drug therapy: Secondary | ICD-10-CM | POA: Diagnosis not present

## 2014-11-09 DIAGNOSIS — M4316 Spondylolisthesis, lumbar region: Secondary | ICD-10-CM | POA: Diagnosis not present

## 2014-11-09 DIAGNOSIS — M5417 Radiculopathy, lumbosacral region: Secondary | ICD-10-CM | POA: Diagnosis not present

## 2014-11-09 DIAGNOSIS — Z8673 Personal history of transient ischemic attack (TIA), and cerebral infarction without residual deficits: Secondary | ICD-10-CM

## 2014-11-09 DIAGNOSIS — M549 Dorsalgia, unspecified: Secondary | ICD-10-CM | POA: Diagnosis not present

## 2014-11-09 HISTORY — PX: LAMINECTOMY WITH POSTERIOR LATERAL ARTHRODESIS LEVEL 1: SHX6335

## 2014-11-09 SURGERY — POSTERIOR LUMBAR FUSION 1 LEVEL
Anesthesia: General | Site: Back

## 2014-11-09 MED ORDER — METOPROLOL SUCCINATE ER 25 MG PO TB24
25.0000 mg | ORAL_TABLET | Freq: Every day | ORAL | Status: DC
  Administered 2014-11-10 – 2014-11-11 (×2): 25 mg via ORAL
  Filled 2014-11-09 (×2): qty 1

## 2014-11-09 MED ORDER — SERTRALINE HCL 50 MG PO TABS
25.0000 mg | ORAL_TABLET | Freq: Every day | ORAL | Status: DC
  Administered 2014-11-10: 25 mg via ORAL
  Filled 2014-11-09: qty 1

## 2014-11-09 MED ORDER — ALPRAZOLAM 0.5 MG PO TABS
0.5000 mg | ORAL_TABLET | Freq: Every day | ORAL | Status: DC | PRN
  Administered 2014-11-09 – 2014-11-10 (×2): 0.5 mg via ORAL
  Filled 2014-11-09 (×2): qty 1

## 2014-11-09 MED ORDER — POLYETHYLENE GLYCOL 3350 17 G PO PACK: 17.0000 g | PACK | Freq: Every day | ORAL | Status: DC | PRN

## 2014-11-09 MED ORDER — SODIUM CHLORIDE 0.9 % IV SOLN: 250.0000 mL | INTRAVENOUS | Status: DC

## 2014-11-09 MED ORDER — SODIUM CHLORIDE 0.9 % IV SOLN
INTRAVENOUS | Status: DC
  Administered 2014-11-09: 17:00:00 via INTRAVENOUS

## 2014-11-09 MED ORDER — PROMETHAZINE HCL 25 MG/ML IJ SOLN: 6.2500 mg | INTRAMUSCULAR | Status: DC | PRN

## 2014-11-09 MED ORDER — NICOTINE 21 MG/24HR TD PT24
21.0000 mg | MEDICATED_PATCH | Freq: Every day | TRANSDERMAL | Status: DC
  Administered 2014-11-10 – 2014-11-11 (×2): 21 mg via TRANSDERMAL
  Filled 2014-11-09 (×2): qty 1

## 2014-11-09 MED ORDER — PROPOFOL 10 MG/ML IV BOLUS
INTRAVENOUS | Status: DC | PRN
  Administered 2014-11-09: 160 mg via INTRAVENOUS

## 2014-11-09 MED ORDER — FENTANYL CITRATE 0.05 MG/ML IJ SOLN
INTRAMUSCULAR | Status: AC
  Filled 2014-11-09: qty 5

## 2014-11-09 MED ORDER — PHENOL 1.4 % MT LIQD: 1.0000 | OROMUCOSAL | Status: DC | PRN

## 2014-11-09 MED ORDER — HYDROMORPHONE HCL 1 MG/ML IJ SOLN
INTRAMUSCULAR | Status: AC
  Filled 2014-11-09: qty 1

## 2014-11-09 MED ORDER — ONDANSETRON HCL 4 MG/2ML IJ SOLN
4.0000 mg | INTRAMUSCULAR | Status: DC | PRN
Start: 2014-11-09 — End: 2014-11-11

## 2014-11-09 MED ORDER — SODIUM CHLORIDE 0.9 % IJ SOLN
3.0000 mL | Freq: Two times a day (BID) | INTRAMUSCULAR | Status: DC
  Administered 2014-11-10: 3 mL via INTRAVENOUS

## 2014-11-09 MED ORDER — HYDROCODONE-ACETAMINOPHEN 5-325 MG PO TABS: 1.0000 | ORAL_TABLET | ORAL | Status: DC | PRN

## 2014-11-09 MED ORDER — MENTHOL 3 MG MT LOZG
1.0000 | LOZENGE | OROMUCOSAL | Status: DC | PRN
  Administered 2014-11-10: 3 mg via ORAL
  Filled 2014-11-09: qty 9

## 2014-11-09 MED ORDER — SODIUM CHLORIDE 0.9 % IJ SOLN: 3.0000 mL | INTRAMUSCULAR | Status: DC | PRN

## 2014-11-09 MED ORDER — SENNA 8.6 MG PO TABS
1.0000 | ORAL_TABLET | Freq: Two times a day (BID) | ORAL | Status: DC
  Administered 2014-11-09 – 2014-11-11 (×4): 8.6 mg via ORAL
  Filled 2014-11-09 (×4): qty 1

## 2014-11-09 MED ORDER — THROMBIN 20000 UNITS EX SOLR
CUTANEOUS | Status: DC | PRN
  Administered 2014-11-09: 11:00:00 via TOPICAL

## 2014-11-09 MED ORDER — GLYCOPYRROLATE 0.2 MG/ML IJ SOLN
INTRAMUSCULAR | Status: DC | PRN
  Administered 2014-11-09: .4 mg via INTRAVENOUS

## 2014-11-09 MED ORDER — LACTATED RINGERS IV SOLN
INTRAVENOUS | Status: DC
  Administered 2014-11-09 (×3): via INTRAVENOUS

## 2014-11-09 MED ORDER — MIDAZOLAM HCL 2 MG/2ML IJ SOLN
INTRAMUSCULAR | Status: AC
  Filled 2014-11-09: qty 2

## 2014-11-09 MED ORDER — METHOCARBAMOL 500 MG PO TABS
500.0000 mg | ORAL_TABLET | Freq: Four times a day (QID) | ORAL | Status: DC | PRN
  Administered 2014-11-09 – 2014-11-11 (×7): 500 mg via ORAL
  Filled 2014-11-09 (×8): qty 1

## 2014-11-09 MED ORDER — DEXAMETHASONE SODIUM PHOSPHATE 10 MG/ML IJ SOLN
INTRAMUSCULAR | Status: DC | PRN
  Administered 2014-11-09: 10 mg via INTRAVENOUS

## 2014-11-09 MED ORDER — AMLODIPINE BESYLATE 5 MG PO TABS
5.0000 mg | ORAL_TABLET | Freq: Every day | ORAL | Status: DC
  Administered 2014-11-10 – 2014-11-11 (×2): 5 mg via ORAL
  Filled 2014-11-09 (×2): qty 1

## 2014-11-09 MED ORDER — DOCUSATE SODIUM 100 MG PO CAPS
100.0000 mg | ORAL_CAPSULE | Freq: Two times a day (BID) | ORAL | Status: DC
  Administered 2014-11-09 – 2014-11-11 (×4): 100 mg via ORAL
  Filled 2014-11-09 (×4): qty 1

## 2014-11-09 MED ORDER — BISACODYL 10 MG RE SUPP: 10.0000 mg | Freq: Every day | RECTAL | Status: DC | PRN

## 2014-11-09 MED ORDER — FENTANYL CITRATE 0.05 MG/ML IJ SOLN
INTRAMUSCULAR | Status: DC | PRN
  Administered 2014-11-09: 100 ug via INTRAVENOUS
  Administered 2014-11-09: 50 ug via INTRAVENOUS
  Administered 2014-11-09 (×2): 100 ug via INTRAVENOUS

## 2014-11-09 MED ORDER — OXYCODONE-ACETAMINOPHEN 5-325 MG PO TABS
1.0000 | ORAL_TABLET | ORAL | Status: DC | PRN
  Administered 2014-11-09 (×2): 2 via ORAL
  Administered 2014-11-10 (×2): 1 via ORAL
  Administered 2014-11-10 – 2014-11-11 (×3): 2 via ORAL
  Administered 2014-11-11: 1 via ORAL
  Filled 2014-11-09: qty 1
  Filled 2014-11-09 (×4): qty 2
  Filled 2014-11-09 (×2): qty 1
  Filled 2014-11-09: qty 2

## 2014-11-09 MED ORDER — METHOCARBAMOL 1000 MG/10ML IJ SOLN
500.0000 mg | Freq: Four times a day (QID) | INTRAVENOUS | Status: DC | PRN
  Filled 2014-11-09: qty 5

## 2014-11-09 MED ORDER — ACETAMINOPHEN 650 MG RE SUPP: 650.0000 mg | RECTAL | Status: DC | PRN

## 2014-11-09 MED ORDER — ARTIFICIAL TEARS OP OINT
TOPICAL_OINTMENT | OPHTHALMIC | Status: DC | PRN
  Administered 2014-11-09: 1 via OPHTHALMIC

## 2014-11-09 MED ORDER — METHOCARBAMOL 500 MG PO TABS
ORAL_TABLET | ORAL | Status: AC
  Filled 2014-11-09: qty 1

## 2014-11-09 MED ORDER — NEOSTIGMINE METHYLSULFATE 10 MG/10ML IV SOLN
INTRAVENOUS | Status: DC | PRN
  Administered 2014-11-09: 3 mg via INTRAVENOUS

## 2014-11-09 MED ORDER — 0.9 % SODIUM CHLORIDE (POUR BTL) OPTIME
TOPICAL | Status: DC | PRN
  Administered 2014-11-09: 1000 mL

## 2014-11-09 MED ORDER — RAMIPRIL 5 MG PO CAPS
10.0000 mg | ORAL_CAPSULE | Freq: Every day | ORAL | Status: DC
  Administered 2014-11-10 – 2014-11-11 (×2): 10 mg via ORAL
  Filled 2014-11-09 (×2): qty 2

## 2014-11-09 MED ORDER — THROMBIN 5000 UNITS EX SOLR
OROMUCOSAL | Status: DC | PRN
  Administered 2014-11-09: 11:00:00 via TOPICAL

## 2014-11-09 MED ORDER — ATORVASTATIN CALCIUM 10 MG PO TABS
20.0000 mg | ORAL_TABLET | Freq: Every day | ORAL | Status: DC
  Administered 2014-11-09 – 2014-11-10 (×2): 20 mg via ORAL
  Filled 2014-11-09 (×2): qty 2

## 2014-11-09 MED ORDER — HYDROMORPHONE HCL 1 MG/ML IJ SOLN
0.2500 mg | INTRAMUSCULAR | Status: DC | PRN
  Administered 2014-11-09: 0.25 mg via INTRAVENOUS
  Administered 2014-11-09: 0.5 mg via INTRAVENOUS
  Administered 2014-11-09: 0.25 mg via INTRAVENOUS

## 2014-11-09 MED ORDER — BUPIVACAINE HCL (PF) 0.5 % IJ SOLN
INTRAMUSCULAR | Status: DC | PRN
  Administered 2014-11-09: 20 mL
  Administered 2014-11-09: 9 mL

## 2014-11-09 MED ORDER — ROCURONIUM BROMIDE 100 MG/10ML IV SOLN
INTRAVENOUS | Status: DC | PRN
  Administered 2014-11-09: 50 mg via INTRAVENOUS

## 2014-11-09 MED ORDER — ONDANSETRON HCL 4 MG/2ML IJ SOLN
INTRAMUSCULAR | Status: DC | PRN
  Administered 2014-11-09: 4 mg via INTRAVENOUS

## 2014-11-09 MED ORDER — MORPHINE SULFATE 2 MG/ML IJ SOLN: 1.0000 mg | INTRAMUSCULAR | Status: DC | PRN

## 2014-11-09 MED ORDER — MIDAZOLAM HCL 5 MG/5ML IJ SOLN
INTRAMUSCULAR | Status: DC | PRN
  Administered 2014-11-09: 2 mg via INTRAVENOUS

## 2014-11-09 MED ORDER — LIDOCAINE-EPINEPHRINE 1 %-1:100000 IJ SOLN
INTRAMUSCULAR | Status: DC | PRN
  Administered 2014-11-09: 9 mL

## 2014-11-09 MED ORDER — EPHEDRINE SULFATE 50 MG/ML IJ SOLN
INTRAMUSCULAR | Status: DC | PRN
  Administered 2014-11-09: 10 mg via INTRAVENOUS

## 2014-11-09 MED ORDER — ACETAMINOPHEN 325 MG PO TABS: 650.0000 mg | ORAL_TABLET | ORAL | Status: DC | PRN

## 2014-11-09 MED ORDER — PROPOFOL 10 MG/ML IV BOLUS
INTRAVENOUS | Status: AC
  Filled 2014-11-09: qty 20

## 2014-11-09 MED ORDER — CEFAZOLIN SODIUM 1-5 GM-% IV SOLN
1.0000 g | Freq: Three times a day (TID) | INTRAVENOUS | Status: AC
  Administered 2014-11-09 – 2014-11-10 (×2): 1 g via INTRAVENOUS
  Filled 2014-11-09 (×2): qty 50

## 2014-11-09 MED ORDER — SODIUM CHLORIDE 0.9 % IR SOLN
Status: DC | PRN
  Administered 2014-11-09: 11:00:00

## 2014-11-09 MED ORDER — LIDOCAINE HCL (CARDIAC) 20 MG/ML IV SOLN
INTRAVENOUS | Status: DC | PRN
  Administered 2014-11-09: 100 mg via INTRAVENOUS

## 2014-11-09 MED ORDER — FLEET ENEMA 7-19 GM/118ML RE ENEM
1.0000 | ENEMA | Freq: Once | RECTAL | Status: AC | PRN
Start: 2014-11-09 — End: 2014-11-09

## 2014-11-09 MED ORDER — ALUM & MAG HYDROXIDE-SIMETH 200-200-20 MG/5ML PO SUSP: 30.0000 mL | Freq: Four times a day (QID) | ORAL | Status: DC | PRN

## 2014-11-09 MED ORDER — KETOROLAC TROMETHAMINE 15 MG/ML IJ SOLN
15.0000 mg | Freq: Four times a day (QID) | INTRAMUSCULAR | Status: AC
  Administered 2014-11-09 – 2014-11-10 (×5): 15 mg via INTRAVENOUS
  Filled 2014-11-09 (×5): qty 1

## 2014-11-09 MED ORDER — VECURONIUM BROMIDE 10 MG IV SOLR
INTRAVENOUS | Status: DC | PRN
  Administered 2014-11-09: 3 mg via INTRAVENOUS

## 2014-11-09 SURGICAL SUPPLY — 62 items
BAG DECANTER FOR FLEXI CONT (MISCELLANEOUS) ×3 IMPLANT
BLADE CLIPPER SURG (BLADE) ×2 IMPLANT
BONE MATRIX OSTEOCEL PRO MED (Bone Implant) ×3 IMPLANT
BUR MATCHSTICK NEURO 3.0 LAGG (BURR) ×3 IMPLANT
CAGE MAS PLIF 9X9X23-8 LUMBAR (Cage) ×4 IMPLANT
CANISTER SUCT 3000ML PPV (MISCELLANEOUS) ×3 IMPLANT
CONT SPEC 4OZ CLIKSEAL STRL BL (MISCELLANEOUS) ×6 IMPLANT
COVER BACK TABLE 60X90IN (DRAPES) ×3 IMPLANT
DECANTER SPIKE VIAL GLASS SM (MISCELLANEOUS) ×3 IMPLANT
DRAPE C-ARM 42X72 X-RAY (DRAPES) ×6 IMPLANT
DRAPE LAPAROTOMY 100X72X124 (DRAPES) ×3 IMPLANT
DRAPE POUCH INSTRU U-SHP 10X18 (DRAPES) ×3 IMPLANT
DRAPE PROXIMA HALF (DRAPES) IMPLANT
DRSG OPSITE POSTOP 4X6 (GAUZE/BANDAGES/DRESSINGS) ×3 IMPLANT
DURAPREP 26ML APPLICATOR (WOUND CARE) ×3 IMPLANT
ELECT REM PT RETURN 9FT ADLT (ELECTROSURGICAL) ×3
ELECTRODE REM PT RTRN 9FT ADLT (ELECTROSURGICAL) ×1 IMPLANT
GAUZE SPONGE 4X4 12PLY STRL (GAUZE/BANDAGES/DRESSINGS) ×3 IMPLANT
GAUZE SPONGE 4X4 16PLY XRAY LF (GAUZE/BANDAGES/DRESSINGS) IMPLANT
GLOVE BIO SURGEON STRL SZ8.5 (GLOVE) ×2 IMPLANT
GLOVE BIOGEL PI IND STRL 8.5 (GLOVE) ×2 IMPLANT
GLOVE BIOGEL PI INDICATOR 8.5 (GLOVE) ×4
GLOVE ECLIPSE 7.5 STRL STRAW (GLOVE) ×10 IMPLANT
GLOVE ECLIPSE 8.5 STRL (GLOVE) ×6 IMPLANT
GLOVE EXAM NITRILE LRG STRL (GLOVE) IMPLANT
GLOVE EXAM NITRILE MD LF STRL (GLOVE) IMPLANT
GLOVE EXAM NITRILE XL STR (GLOVE) IMPLANT
GLOVE EXAM NITRILE XS STR PU (GLOVE) IMPLANT
GLOVE INDICATOR 7.5 STRL GRN (GLOVE) ×4 IMPLANT
GLOVE INDICATOR 8.0 STRL GRN (GLOVE) ×4 IMPLANT
GLOVE OPTIFIT SS 8.0 STRL (GLOVE) IMPLANT
GOWN STRL REUS W/ TWL LRG LVL3 (GOWN DISPOSABLE) IMPLANT
GOWN STRL REUS W/ TWL XL LVL3 (GOWN DISPOSABLE) IMPLANT
GOWN STRL REUS W/TWL 2XL LVL3 (GOWN DISPOSABLE) ×6 IMPLANT
GOWN STRL REUS W/TWL LRG LVL3 (GOWN DISPOSABLE)
GOWN STRL REUS W/TWL XL LVL3 (GOWN DISPOSABLE) ×3
HEMOSTAT POWDER KIT SURGIFOAM (HEMOSTASIS) ×2 IMPLANT
KIT BASIN OR (CUSTOM PROCEDURE TRAY) ×3 IMPLANT
KIT ROOM TURNOVER OR (KITS) ×3 IMPLANT
LIQUID BAND (GAUZE/BANDAGES/DRESSINGS) ×3 IMPLANT
NEEDLE HYPO 22GX1.5 SAFETY (NEEDLE) ×3 IMPLANT
NS IRRIG 1000ML POUR BTL (IV SOLUTION) ×3 IMPLANT
PACK LAMINECTOMY NEURO (CUSTOM PROCEDURE TRAY) ×3 IMPLANT
PAD ARMBOARD 7.5X6 YLW CONV (MISCELLANEOUS) ×9 IMPLANT
PATTIES SURGICAL .5 X1 (DISPOSABLE) ×3 IMPLANT
ROD RELINE-O LORD 5.5X35MM (Rod) ×6 IMPLANT
SCREW LOCK RELINE 5.5 TULIP (Screw) ×8 IMPLANT
SCREW RELINE-O POLY 6.5X45 (Screw) ×8 IMPLANT
SPONGE LAP 4X18 X RAY DECT (DISPOSABLE) IMPLANT
SPONGE SURGIFOAM ABS GEL 100 (HEMOSTASIS) ×3 IMPLANT
SUT VIC AB 1 CT1 18XBRD ANBCTR (SUTURE) ×1 IMPLANT
SUT VIC AB 1 CT1 8-18 (SUTURE) ×3
SUT VIC AB 2-0 CP2 18 (SUTURE) ×3 IMPLANT
SUT VIC AB 3-0 SH 8-18 (SUTURE) ×3 IMPLANT
SYR 20ML ECCENTRIC (SYRINGE) ×3 IMPLANT
SYR 3ML LL SCALE MARK (SYRINGE) ×12 IMPLANT
TOWEL OR 17X24 6PK STRL BLUE (TOWEL DISPOSABLE) ×3 IMPLANT
TOWEL OR 17X26 10 PK STRL BLUE (TOWEL DISPOSABLE) ×3 IMPLANT
TRAP SPECIMEN MUCOUS 40CC (MISCELLANEOUS) ×3 IMPLANT
TRAY FOLEY CATH 14FRSI W/METER (CATHETERS) ×1 IMPLANT
TRAY FOLEY CATH 16FRSI W/METER (SET/KITS/TRAYS/PACK) ×2 IMPLANT
WATER STERILE IRR 1000ML POUR (IV SOLUTION) ×3 IMPLANT

## 2014-11-09 NOTE — Progress Notes (Signed)
Orthopedic Tech Progress Note Patient Details:  Terry Kemp December 26, 1945 [id number] Brace order called in to [employer] by A. Grandville Silos. Patient ID: Terry Kemp, male   DOB: [birth year], 69 y.o.   MRN: [id number]   [name] 11/09/2014, 3:00 PM

## 2014-11-09 NOTE — Op Note (Signed)
Date of surgery: 11/09/2014 Preoperative diagnosis: Spondylolisthesis L5-S1 with lumbar radiculopathy on right Postoperative diagnosis: Spondylolisthesis L5-S1 with lumbar radiculopathy on right Procedure: Lumbar decompression with laminectomy L5 decompression of L5 and S1 nerve roots, more work than require for simple interbody technique. Posterior lumbar interbody fusion using autograft and allograft peek spacers pedicle screw fixation L5-S1 with posterior lateral arthrodesis using allograft and autograft. Surgeon: Kristeen Miss M.D. Anesthesia: Gen. endotracheal Indications: The patient is a 69 year old individual is had severe back and right lower extremity pain. He has had a progressive spondylolisthesis at L5-S1. He's been having back and right leg pain and now some weakness. He is advised regarding the need for surgical decompression and arthrodesis.  Procedure: Patient was brought to the operating room supine on a stretcher. After the smooth induction of general endotracheal anesthesia, he was turned prone. The back was prepped with alcohol DuraPrep. A midline incision was created and carried down to the lumbar dorsal fascia. The fascia was opened on either side of midline to expose the spinous process of L5 which was noted to be loose with the laminar arch. The dissection was carried out over the hypertrophied facet joints at the L5-S1 level. The laminar arch of L5 was then carefully dissected subperiosteally and removed en bloc. The naked facets at L5 were explored and redundant yellow ligament was noted to be causing significant compression for the L5 nerve root particularly on the right side. The decompression was undertaken using a key series of 2 and 3 mm Kerrison punches. The S1 nerve root on that side was similarly decompressed. On the left side compression was less but also present. The disc space was identified. There was noted to be a significant grade 2 spondylolisthesis. The disc space  was opened and a hypertrophied endplate from the superior endplate of the sacrum was removed using a 2 and 3 mm Kerrison punch. The dissection was then continued until the disc space was completely evacuated of all this disc material and the endplates were curettaged smooth. A series of trials were then used to distract the disc space and is felt that ultimately an 8 9 mm tall 23 mm long peek spacer would fit best to some allow for the decompression particularly of the L5 nerve root in the lateral recess on that right side. Peek spacers of this configuration were then packed with autograft and allograft allograft being osseous cell which was mixed with the autograft that was present. The interspace was filled with bone as would was the lateral recesses and ultimately the peek spacers were tamped into position under radiographic control. The lateral gutters had been decorticated between the aorta of the sacrum and the transverse process of L5. These were packed away. Pedicle entry sites were chosen at L5 and in the sacrum and in each of these openings a 6.5 x 45 mm pedicle screw was placed. He 5 mm precontoured rods were then placed into the saddles of the screws and tightening neutral construct. Radiographic confirmation of the surgical construct was obtained and felt to be quite adequate. Lateral gutters which had been previously packed off with impact with the remainder of autograft and allograft combination. Once the graft was packed the wound was checked for hemostasis nerve roots were checked to make sure that they're adequately decompressed and her travel at the foramen and no bone or debris was cluttering them the lumbar dorsal fascia was then closed with #1 Vicryl interrupted fashion, 2-0 Vicryl was used in subcutaneous tissues, 3-0 Vicryl  was used to close subcuticular skin. 20 mL of half percent Marcaine was injected into the paraspinous fascia at the time of closure. Blood loss for the procedure was  estimated to her 50 mL, the patient tolerated procedure well.`

## 2014-11-09 NOTE — Progress Notes (Signed)
Pt states he drank 1/2 black coffee this am at 0400 and used the coffee to take his meds this am. Dr Oletta Lamas called and informed.

## 2014-11-09 NOTE — Progress Notes (Signed)
Quick Note:  Stress Test looked good!! No sign of significant Heart Artery Disease. Pump function is normal. The only abnormal finding is consistent with a common artifact.  Good news!!.  Leonie Man, MD  ______

## 2014-11-09 NOTE — Progress Notes (Signed)
Utilization review completed.  

## 2014-11-09 NOTE — Progress Notes (Signed)
Patient arrived to floor from PACU. He denied any pain at this time. Safety precautions and orders reviewed with patient. Call light and possessions at bedside. Will continue to monitor.  Ave Filter, RN

## 2014-11-09 NOTE — Anesthesia Preprocedure Evaluation (Signed)
Anesthesia Evaluation  Patient identified by MRN, date of birth, ID band Patient awake    Reviewed: Allergy & Precautions, NPO status , Patient's Chart, lab work & pertinent test results  Airway Mallampati: II  TM Distance: >3 FB Neck ROM: Full    Dental   Pulmonary Current Smoker,  breath sounds clear to auscultation        Cardiovascular hypertension, + CAD and + Peripheral Vascular Disease Rhythm:Regular Rate:Normal     Neuro/Psych    GI/Hepatic negative GI ROS, Neg liver ROS,   Endo/Other  negative endocrine ROS  Renal/GU negative Renal ROS     Musculoskeletal   Abdominal   Peds  Hematology   Anesthesia Other Findings   Reproductive/Obstetrics                             Anesthesia Physical Anesthesia Plan  ASA: III  Anesthesia Plan: General   Post-op Pain Management:    Induction:   Airway Management Planned: Oral ETT  Additional Equipment:   Intra-op Plan:   Post-operative Plan: Extubation in OR  Informed Consent: I have reviewed the patients History and Physical, chart, labs and discussed the procedure including the risks, benefits and alternatives for the proposed anesthesia with the patient or authorized representative who has indicated his/her understanding and acceptance.   Dental advisory given  Plan Discussed with: CRNA, Anesthesiologist and Surgeon  Anesthesia Plan Comments:         Anesthesia Quick Evaluation

## 2014-11-09 NOTE — Anesthesia Postprocedure Evaluation (Signed)
  Anesthesia Post-op Note  Patient: Terry Kemp  Procedure(s) Performed: Procedure(s): Lumbar five-sacral one Posterior lumbar interbody fusion (N/A)  Patient Location: PACU  Anesthesia Type:General  Level of Consciousness: awake  Airway and Oxygen Therapy: Patient Spontanous Breathing  Post-op Pain: mild  Post-op Assessment: Post-op Vital signs reviewed  Post-op Vital Signs: Reviewed  Last Vitals:  Filed Vitals:   11/09/14 1455  BP: 135/63  Pulse: 61  Temp:   Resp: 9    Complications: No apparent anesthesia complications

## 2014-11-09 NOTE — H&P (Signed)
CHIEF COMPLAINT:                                          Back and right lower extremity pain.  HISTORY OF PRESENT ILLNESS:                     [name] is a 69 year old individual who has had significant problems with back and right lower extremity pain that has been going on for at least a couple of years.  It has been getting worse lately, and in 09/2013 he underwent an MRI of the lower lumbar spine. That study revealed the presence of a grade I-II spondylolisthesis at L5-S1 with advanced degenerative changes in the disk space at L5-S1.  There was severe bilateral foraminal stenosis for the L5 nerve root particularly and yes, one nerve root was stretched posteriorly in the spondylolisthesis.  The patient has been being treated conservatively but has been having increasing back pain and now right lower extremity pain and dysfunction.  He is seen now for further evaluation and suggestions regarding any intervention.  REVIEW OF SYSTEMS:                                    Systems review is notable for leg weakness, back pain and leg pain on a 14-point review sheet.  He also notes on this review sheet that he has some difficulties with anxiety.  His pain drawing today demonstrates the pain down the posterolateral aspect of the right lower extremity.  He notes that it has been going on for two years.  He notes that nothing seems to be helping with the pain and it has gotten considerably worse recently.  PAST MEDICAL HISTORY:                                Past medical history reveals that he has had a heart attack in the past.  He has also had a stroke.  He has been on Plavix for this purpose.  . Medications and Allergies:  His current medication list includes Toprol-XL 25 mg a day, Norvasc 5 mg a day, Altace 5 mg a day, Plavix 75 mg daily, Crestor 40 mg a day, and alprazolam as needed for anxiety.  SOCIAL HISTORY:                                            His personal history reveals that he smokes  about a pack of cigarettes a day since age 36.  He drinks alcohol on a daily basis socially.  PHYSICAL EXAMINATION:                                His height and weight have been stable at 5 feet 6 inches and 140 pounds.  NEUROLOGICAL EXAMINATION:                       On physical examination, I note that the patient stands straight and erect.  He is able to walk onto his toes but walking  onto his heels reveals that he has some mild weakness in the right lower extremity and the tibialis anterior group, graded at 4-/5.  Marland Kitchen Motor Examination - Straight leg raising is positive at 30 degrees.  Patrick maneuver is negative bilaterally.    . Sensory Examination - Sensation appears intact bilaterally to pin and light touch.  . Deep Tendon Reflexes - Deep tendon reflexes are 2+ in the patellae, absent in both Achilles.  IMPRESSION:                                                   [name] has an advanced spondylolisthesis with significant stenosis for the L5 nerve roots.  In light of the fact that this process has been getting worse despite efforts as conservative management I have advised the patient that he should undergo surgical decompression and stabilization at the L5-S1 level.  Though the weakness is modest at this time, his symptoms have been worsening and  I do not see that further conservative effort is likely to yield him much improvement.  I discussed surgical intervention to remove the laminar arch of L5, decompress the L5 and the S1 roots, enter the disk space and remove it entirely.  We would perform a fusion before L5 and S1 using some PEEK spacers to hold the vertebrae apart and then place screws into the vertebrae of L5 and S1 through the pedicle.  The screws would be connected with the rod to hold the two vertebrae together to allow them to heal.  Surgery itself takes about three hours.  Thereafter, the patient would be in the hospital about three days.  He would be gradually  mobilized with the use of a lumbar corset.  He will spend about seven to eight weeks in the lumbar brace, and thereafter he can be mobilized without the brace, and recovery takes totally about four months, though he should be feeling better much sooner than that.  After some discussion, the patient is eager to proceed with surgical intervention and we will plan on scheduling the surgery at his earliest convenience.

## 2014-11-09 NOTE — Progress Notes (Signed)
Patient ID: Terry Kemp, male   DOB: 06-30-1946, 69 y.o.   MRN: [id number] Vision is clean and dry Motor function appears intact in lower extremities Vital signs remained stable Stable postop

## 2014-11-09 NOTE — Transfer of Care (Signed)
Immediate Anesthesia Transfer of Care Note  Patient: Terry Kemp  Procedure(s) Performed: Procedure(s): Lumbar five-sacral one Posterior lumbar interbody fusion (N/A)  Patient Location: PACU  Anesthesia Type:General  Level of Consciousness: awake, alert  and oriented  Airway & Oxygen Therapy: Patient Spontanous Breathing and Patient connected to nasal cannula oxygen  Post-op Assessment: Report given to RN and Post -op Vital signs reviewed and stable  Post vital signs: Reviewed and stable  Last Vitals:  Filed Vitals:   11/09/14 0656  BP: 170/93  Pulse: 59  Temp: 36.6 C  Resp: 20    Complications: No apparent anesthesia complications

## 2014-11-10 MED FILL — Sodium Chloride IV Soln 0.9%: INTRAVENOUS | Qty: 1000 | Status: AC

## 2014-11-10 MED FILL — Heparin Sodium (Porcine) Inj 1000 Unit/ML: INTRAMUSCULAR | Qty: 30 | Status: AC

## 2014-11-10 NOTE — Evaluation (Signed)
Physical Therapy Evaluation Patient Details Name: Terry Kemp MRN: [id number] DOB: 19-Aug-1946 Today's Date: 11/10/2014   History of Present Illness  Lumbar five-sacral one Posterior lumbar interbody fusion  Clinical Impression  Patient presents with mild pain and balance deficits s/p above surgery. Tolerated ambulation with and without use of RW. Balance improved with use of RW for stability/safety. Education provided on back precautions and how to incorporate them into daily activities and exercise. Pt would benefit from skilled PT to improve higher level balance and overall safe mobility so pt can maximize independence prior to return home.    Follow Up Recommendations No PT follow up;Supervision - Intermittent    Equipment Recommendations  None recommended by PT    Recommendations for Other Services       Precautions / Restrictions Precautions Precautions: Fall;Back Precaution Booklet Issued: Yes (comment) Precaution Comments: Reviewed handout and precautions. Required Braces or Orthoses: Spinal Brace Spinal Brace: Applied in sitting position Restrictions Weight Bearing Restrictions: No      Mobility  Bed Mobility Overal bed mobility: Needs Assistance Bed Mobility: Rolling;Sidelying to Sit;Sit to Sidelying Rolling: Supervision Sidelying to sit: Supervision     Sit to sidelying: Supervision General bed mobility comments: HOB flat, no use of rails to simulate home environment. Cues for log roll technique.  Transfers Overall transfer level: Needs assistance Equipment used: Rolling walker (2 wheeled) Transfers: Sit to/from Stand Sit to Stand: Supervision         General transfer comment: Supervision for safety. Sway noted upon standing initially.  Ambulation/Gait Ambulation/Gait assistance: Min guard Ambulation Distance (Feet): 510 Feet Assistive device: Rolling walker (2 wheeled);None Gait Pattern/deviations: Step-through pattern;Decreased stride  length;Drifts right/left   Gait velocity interpretation: Below normal speed for age/gender General Gait Details: Ambulated with and without use of RW. Mod I with RW with cues for upright posture and Min guard for safety without RW due to drifting and unsteadiness.  Stairs Stairs: Yes Stairs assistance: Supervision Stair Management: Two rails;Alternating pattern Number of Stairs: 3 (+ 2 steps x2 bouts) General stair comments: Cues for technique.  Wheelchair Mobility    Modified Rankin (Stroke Patients Only)       Balance Overall balance assessment: Needs assistance Sitting-balance support: No upper extremity supported;Feet supported Sitting balance-Leahy Scale: Good     Standing balance support: During functional activity Standing balance-Leahy Scale: Fair                               Pertinent Vitals/Pain Pain Assessment: 0-10 Pain Score: 1  Pain Location: back at surgical site Pain Descriptors / Indicators: Aching;Tightness Pain Intervention(s): Monitored during session    Home Living Family/patient expects to be discharged to:: Private residence Living Arrangements: Alone (Has a gf that can assist) Available Help at Discharge: Family;Friend(s) Type of Home: House Home Access: Stairs to enter Entrance Stairs-Rails: None Entrance Stairs-Number of Steps: 2 Home Layout: One level Home Equipment: Walker - 4 wheels;Cane - single point      Prior Function Level of Independence: Independent         Comments: 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 Loves to cook.     Hand Dominance   Dominant Hand: Right    Extremity/Trunk Assessment   Upper Extremity Assessment: Defer to OT evaluation;Overall [facility] for tasks assessed           Lower Extremity Assessment: Overall WFL for tasks assessed      Cervical /  Trunk Assessment: Normal  Communication   Communication: No difficulties  Cognition Arousal/Alertness: Awake/alert Behavior During  Therapy: WFL for tasks assessed/performed Overall Cognitive Status: Within Functional Limits for tasks assessed                      General Comments General comments (skin integrity, edema, etc.): Able to squat down to floor to demonstrate correct body mechanics for picking up objects.    Exercises        Assessment/Plan    PT Assessment Patient needs continued PT services  PT Diagnosis Difficulty walking   PT Problem List Pain;Decreased balance;Decreased knowledge of use of DME  PT Treatment Interventions Balance training;Gait training;Stair training;Patient/family education;Functional mobility training;Therapeutic activities;Therapeutic exercise;DME instruction   PT Goals (Current goals can be found in the Care Plan section) Acute Rehab PT Goals Patient Stated Goal: to return home and get back to my activities PT Goal Formulation: With patient Time For Goal Achievement: 11/24/14 Potential to Achieve Goals: Good    Frequency Min 5X/week   Barriers to discharge Decreased caregiver support Pt lives alone but reports having assist from gf if needed.    Co-evaluation               End of Session Equipment Utilized During Treatment: Gait belt;Back brace Activity Tolerance: Patient tolerated treatment well Patient left: in chair;with call bell/phone within reach;with chair alarm set Nurse Communication: Mobility status         Time: 0786-7544 PT Time Calculation (min) (ACUTE ONLY): 24 min   Charges:   PT Evaluation $Initial PT Evaluation Tier I: 1 Procedure PT Treatments $Self Care/Home Management: 8-22   PT G CodesCandy Sledge A 22-Nov-2014, 10:41 AM Candy Sledge, PT, DPT [phone]

## 2014-11-10 NOTE — Evaluation (Signed)
Occupational Therapy Evaluation Patient Details Name: OBADIAH DENNARD MRN: [id number] DOB: 02-13-1946 Today's Date: 11/10/2014    History of Present Illness Lumbar five-sacral one Posterior lumbar interbody fusion   Clinical Impression   Pt is currently modified independent for all simulated selfcare tasks and functional transfers.  No further OT needs or DME at this time.  He will have initial supervision from his significant other as well as friends.      Follow Up Recommendations  No OT follow up    Equipment Recommendations  None recommended by OT       Precautions / Restrictions Precautions Precautions: Fall;Back Precaution Booklet Issued: Yes (comment) Precaution Comments: Reviewed handout and precautions. Required Braces or Orthoses: Spinal Brace Spinal Brace: Applied in sitting position Restrictions Weight Bearing Restrictions: No      Mobility Bed Mobility                  Transfers Overall transfer level: Modified independent                    Balance Overall balance assessment: Modified Independent                                          ADL Overall ADL's : Needs assistance/impaired Eating/Feeding: Independent   Grooming: Modified independent;Standing   Upper Body Bathing: Modified independent;Standing   Lower Body Bathing: Modified independent;Sit to/from stand   Upper Body Dressing : Modified independent;Sitting   Lower Body Dressing: Modified independent;Sit to/from stand   Toilet Transfer: Modified Independent;Grab bars;Comfort height toilet   Toileting- Clothing Manipulation and Hygiene: Modified independent;Sit to/from stand   Tub/ Banker: Walk-in shower;Tub transfer;Modified independent   Functional mobility during ADLs: Modified independent General ADL Comments: Pt modified independent with mobility and selfcare at this time.  He has a walk-in shower with built in seat and plans to have  initial [place] from his girlfriend and other friends.  He was able to squat to the floor and pick an object up, adhering to his back precautions, without difficulty.       Vision Vision Assessment?: No apparent visual deficits   Perception Perception Perception Tested?: No   Praxis Praxis Praxis tested?: Within functional limits    Pertinent Vitals/Pain Pain Assessment: 0-10 Pain Score: 1  Pain Location: back at surgical site Pain Descriptors / Indicators: Aching;Tightness Pain Intervention(s): Monitored during session     Hand Dominance Right   Extremity/Trunk Assessment Upper Extremity Assessment Upper Extremity Assessment: Defer to OT evaluation;Overall [facility] for tasks assessed   Lower Extremity Assessment Lower Extremity Assessment: Overall WFL for tasks assessed   Cervical / Trunk Assessment Cervical / Trunk Assessment: Normal   Communication Communication Communication: No difficulties   Cognition Arousal/Alertness: Awake/alert Behavior During Therapy: WFL for tasks assessed/performed Overall Cognitive Status: Within Functional Limits for tasks assessed                                Home Living Family/patient expects to be discharged to:: Private residence Living Arrangements: Alone (Has a gf that can assist) Available Help at Discharge: Family;Friend(s) Type of Home: House Home Access: Stairs to enter [employer] of Steps: 2 Entrance Stairs-Rails: None Home Layout: One level     Bathroom Shower/Tub: Walk-in shower (seat built in)   Biochemist, clinical: Associate Professor  Accessibility: Yes   Home Equipment: Walker - 4 wheels;Cane - single point          Prior Functioning/Environment Level of Independence: Independent        Comments: 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 Loves to cook.                              End of Session Equipment Utilized During Treatment: Back brace Nurse Communication: Mobility  status  Activity Tolerance: Patient tolerated treatment well Patient left: in chair;with call bell/phone within reach   Time: 1188-6773 OT Time Calculation (min): 22 min Charges:  OT General Charges $OT Visit: 1 Procedure OT Evaluation $Initial OT Evaluation Tier I: 1 Procedure OT Treatments $Self Care/Home Management : 8-22 mins  Tereza Gilham OTR/L 11/10/2014, 10:30 AM

## 2014-11-10 NOTE — Progress Notes (Signed)
Patient ID: Terry Kemp, male   DOB: 03-28-1946, 69 y.o.   MRN: [id number] I will signs are stable Motor function appears intact Dressing is clean dry Patient has been ambulatory and is proceeding independently Plan discharge in a.m.

## 2014-11-10 NOTE — Clinical Social Work Note (Signed)
CSW consult acknowledge:  Clinical Social Worker received a consult in reference to SNF placement. PT/OT is recommending No PT/ No OT follow up.   Clinical Social Worker will sign off for now as social work intervention is no longer needed. Please consult [place] again if new need arises.CSW consult acknowledge:  Glendon Axe, MSW, LCSWA [phone] 11/10/2014 3:04 PM

## 2014-11-10 NOTE — Care Management Note (Addendum)
    Page 1 of 1   11/12/2014     10:20:07 AM CARE MANAGEMENT NOTE 11/12/2014  Patient:  Terry Kemp, Terry Kemp   Account Number:  [id number]  Date Initiated:  11/10/2014  Documentation initiated by:  Zaleah Ternes  Subjective/Objective Assessment:   Patient was admitted for PLIF.     Action/Plan:   Will follow for discharge needs pending PT/OT evals and physician orders.   Anticipated DC Date:  11/11/2014   Anticipated DC Plan:  [place]  CM consult      Choice offered to / List presented to:             Status of service:  Completed, signed off Medicare Important Message given?  NA - LOS <3 / Initial given by admissions (If response is "NO", the following Medicare IM given date fields will be blank) Date Medicare IM given:   Medicare IM given by:   Date Additional Medicare IM given:   Additional Medicare IM given by:    Discharge Disposition:  HOME/SELF CARE  Per UR Regulation:  Reviewed for med. necessity/level of care/duration of stay  If discussed at [place] of Stay Meetings, dates discussed:    Comments:  11/10/14  Lorne Skeens RN, MSN, CM- PT/OT recommending no follow-up at discharge.

## 2014-11-11 MED ORDER — METHOCARBAMOL 500 MG PO TABS: 500.0000 mg | ORAL_TABLET | Freq: Four times a day (QID) | ORAL | Status: DC | PRN

## 2014-11-11 MED ORDER — OXYCODONE-ACETAMINOPHEN 5-325 MG PO TABS: 1.0000 | ORAL_TABLET | ORAL | Status: DC | PRN

## 2014-11-11 NOTE — Progress Notes (Signed)
Patient alert and oriented, voiding adequate amount of urine, maes well. Patient discharged home with friends. Discharged instructions and script given to patient at discharge. Patient stated understanding of instructions given.  Tomma Rakers RN

## 2014-11-11 NOTE — Progress Notes (Signed)
Physical Therapy Treatment Patient Details Name: Terry Kemp MRN: [id number] DOB: 14-Apr-1946 Today's Date: 11/11/2014    History of Present Illness Lumbar five-sacral one Posterior lumbar interbody fusion    PT Comments    Patient progressing well with mobility however limited by pain today. Pt declined further mobility as pt to be d/ced later in PM and wants to save his energy. Pt with lots of questions regarding performing activities at home. Discussed modifications and energy conservation techniques. Will continue to follow per current POC if pt still in hospital tomorrow.   Follow Up Recommendations  No PT follow up;Supervision - Intermittent     Equipment Recommendations  None recommended by PT    Recommendations for Other Services       Precautions / Restrictions Precautions Precautions: Fall;Back Precaution Booklet Issued: Yes (comment) Precaution Comments: Reviewed handout and precautions. Required Braces or Orthoses: Spinal Brace Spinal Brace: Applied in sitting position Restrictions Weight Bearing Restrictions: No    Mobility  Bed Mobility               General bed mobility comments: Sitting in chair upon PT arrival.   Transfers Overall transfer level: Needs assistance Equipment used: None Transfers: Sit to/from Stand Sit to Stand: Supervision         General transfer comment: Supervision for safety. Stood fromr Product manager.  Ambulation/Gait Ambulation/Gait assistance: Supervision Ambulation Distance (Feet): 100 Feet Assistive device: None Gait Pattern/deviations: Step-through pattern;Decreased stride length   Gait velocity interpretation: Below normal speed for age/gender General Gait Details: Slow, guarded gait without use of RW.    Stairs            Wheelchair Mobility    Modified Rankin (Stroke Patients Only)       Balance Overall balance assessment: Needs assistance Sitting-balance support: Feet supported;No upper  extremity supported Sitting balance-Leahy Scale: Good     Standing balance support: During functional activity Standing balance-Leahy Scale: Fair                      Cognition Arousal/Alertness: Awake/alert Behavior During Therapy: WFL for tasks assessed/performed Overall Cognitive Status: Within Functional Limits for tasks assessed                      Exercises      General Comments General comments (skin integrity, edema, etc.): Lengthy discussion with patient about slowly increasing activity level at home, pain management and specific recommendations for modifications for mobility based on questions asked.      Pertinent Vitals/Pain Pain Assessment: 0-10 Pain Score: 2  Pain Location: back Pain Descriptors / Indicators: Sore;Aching Pain Intervention(s): Monitored during session;Premedicated before session    Home Living                      Prior Function            PT Goals (current goals can now be found in the care plan section) Progress towards PT goals: Progressing toward goals    Frequency  Min 5X/week    PT Plan Current plan remains appropriate    Co-evaluation             End of Session Equipment Utilized During Treatment: Gait belt;Back brace Activity Tolerance: Patient tolerated treatment well Patient left: in chair;with call bell/phone within reach     Time: 0931-0950 PT Time Calculation (min) (ACUTE ONLY): 19 min  Charges:  $Self Care/Home Management: 8-22  G CodesCandy Sledge A 20-Nov-2014, 10:09 AM  Candy Sledge, PT, DPT [phone]

## 2014-11-11 NOTE — Discharge Summary (Signed)
Physician Discharge Summary  Patient ID: Terry Kemp MRN: [id number] DOB/AGE: [birth year] 69 y.o.  Admit date: 11/09/2014 Discharge date: 11/11/2014  Admission Diagnoses: Spondylolisthesis L5-S1 with lumbar radiculopathy on right  Discharge Diagnoses: Spondylolisthesis L5-S1 with lumbar radiculopathy on right Active Problems:   Spondylolisthesis at L5-S1 level   Discharged Condition: good  Hospital Course: Patient was admitted to undergo surgical decompression of grade 2 spondylolisthesis at L5-S1. He tolerated the surgery well. His incision has been clean dry. He's been advised regarding postoperative activities.  Consults: None  Significant Diagnostic Studies: Nonesurgery: Laminectomy decompression of L5 and S1 nerve roots with posterior lumbar interbody arthrodesis L5-S1 pedicle screw fixation L5-S1 posterior lateral arthrodesis L5-S1  Treatments: surgery: Laminectomy decompression of L5 and S1 nerve roots with posterior lumbar interbody arthrodesis L5-S1 pedicle screw fixation L5-S1 posterior lateral arthrodesis L5-S1  Discharge Exam: Blood pressure 173/85, pulse 68, temperature 98.4 F (36.9 C), temperature source Oral, resp. rate 18, height 5\' 6"  (1.676 m), weight 64.864 kg (143 lb), SpO2 96 %. Incision is clean and dry, motor function is intact in lower extremities. Patient and gait are intact.  Disposition:  discharge home  Discharge Instructions    Call MD for:  redness, tenderness, or signs of infection (pain, swelling, redness, odor or green/yellow discharge around incision site)    Complete by:  As directed      Call MD for:  severe uncontrolled pain    Complete by:  As directed      Call MD for:  temperature >100.4    Complete by:  As directed      Diet - low sodium heart healthy    Complete by:  As directed      Discharge instructions    Complete by:  As directed   Okay to shower. Do not apply salves or appointments to incision. No heavy lifting with the  upper extremities greater than 15 pounds. May resume driving when not requiring pain medication and patient feels comfortable with doing so.     Increase activity slowly    Complete by:  As directed             Medication List    TAKE these medications        ALPRAZolam 1 MG tablet  Commonly known as:  XANAX  Take 0.5 mg by mouth as needed.     amLODipine 5 MG tablet  Commonly known as:  NORVASC  Take 5 mg by mouth daily.     aspirin 325 MG tablet  Take 1,075 mg by mouth as needed. Prn tid     atorvastatin 20 MG tablet  Commonly known as:  LIPITOR  Take 20 mg by mouth daily at 6 PM.     clopidogrel 75 MG tablet  Commonly known as:  PLAVIX  Take 75 mg by mouth daily.     [employer] FISH OIL 1000 MG Caps  Take by mouth.     methocarbamol 500 MG tablet  Commonly known as:  ROBAXIN  Take 1 tablet (500 mg total) by mouth every 6 (six) hours as needed for muscle spasms.     metoprolol succinate 25 MG 24 hr tablet  Commonly known as:  TOPROL-XL  Take 25 mg by mouth daily.     multivitamin tablet  Take 1 tablet by mouth daily.     nicotine 21 mg/24hr patch  Commonly known as:  NICODERM CQ - dosed in mg/24 hours  Place 21 mg onto the skin daily.  oxyCODONE-acetaminophen 5-325 MG per tablet  Commonly known as:  PERCOCET/ROXICET  Take 1-2 tablets by mouth every 4 (four) hours as needed for moderate pain.     ramipril 10 MG capsule  Commonly known as:  ALTACE  Take 10 mg by mouth daily.     sertraline 25 MG tablet  Commonly known as:  ZOLOFT  Take 1 tablet (25 mg total) by mouth at bedtime.     VITAMIN B-12 CR PO  Take by mouth daily.         SignedEarleen Newport 11/11/2014, 9:03 AM

## 2014-12-03 DIAGNOSIS — I10 Essential (primary) hypertension: Secondary | ICD-10-CM | POA: Diagnosis not present

## 2014-12-03 DIAGNOSIS — M545 Low back pain: Secondary | ICD-10-CM | POA: Diagnosis not present

## 2014-12-21 ENCOUNTER — Encounter: Payer: Self-pay | Admitting: Cardiology

## 2014-12-21 ENCOUNTER — Ambulatory Visit (INDEPENDENT_AMBULATORY_CARE_PROVIDER_SITE_OTHER): Payer: Medicare Other | Admitting: Cardiology

## 2014-12-21 VITALS — BP 180/80 | HR 72 | Ht 66.0 in | Wt 138.9 lb

## 2014-12-21 DIAGNOSIS — E785 Hyperlipidemia, unspecified: Secondary | ICD-10-CM

## 2014-12-21 DIAGNOSIS — G463 Brain stem stroke syndrome: Secondary | ICD-10-CM | POA: Diagnosis not present

## 2014-12-21 DIAGNOSIS — Z9861 Coronary angioplasty status: Secondary | ICD-10-CM

## 2014-12-21 DIAGNOSIS — I10 Essential (primary) hypertension: Secondary | ICD-10-CM | POA: Diagnosis not present

## 2014-12-21 DIAGNOSIS — I739 Peripheral vascular disease, unspecified: Secondary | ICD-10-CM

## 2014-12-21 DIAGNOSIS — F17200 Nicotine dependence, unspecified, uncomplicated: Secondary | ICD-10-CM

## 2014-12-21 DIAGNOSIS — I2510 Atherosclerotic heart disease of native coronary artery without angina pectoris: Secondary | ICD-10-CM

## 2014-12-21 MED ORDER — AMLODIPINE BESYLATE 10 MG PO TABS: 10.0000 mg | ORAL_TABLET | Freq: Every day | ORAL | Status: DC

## 2014-12-21 NOTE — Patient Instructions (Signed)
Increase to 10 mg Amlodipine  Daily  Check your blood pressure twice a day  For a week then every other day for couple weeks, may call /send recordings to office. Fax number [phone].   Your physician wants you to follow-up in 4 -6 months DR HARDING. [place]. You will receive a reminder letter in the mail two months in advance. If you don't receive a letter, please call our office to schedule the follow-up appointment.

## 2014-12-21 NOTE — Progress Notes (Signed)
PCP:  Melinda Crutch, MD  Clinic Note: Chief Complaint  Patient presents with  . Follow-up    1 MONTH   F/U ,  POST MYOVIEW  , RECENT BACK SURGERY  . Coronary Artery Disease  . PAD    HPI: Terry Kemp is a 69 y.o. male with a PMH below who presents today for ~ 1 month f/u after pre-op evaluation. I last saw him in 2012 (he had been living in [place]). He has h/o PAD & CAD.  He his a history of remote inferior STEMI, Rx'd by Dr Melvern Banker with an RCA DES. He had a nuclear stress in 2008 that was low risk. S/p Bilateral Iliac stents by Dr. Scot Dock in 2000 & 2006.  He moved to [facility] and has been back here intermittently since. He was followed by a cardiologist in [facility] but has not had any other functional studies. He moved back to [facility] in time to have back surgery scheduled for 11/09/14 with Dr Ellene Route. He was seen for pre-operative evaluation by Kerin Ransom, PA on 11/04/14 -- had a Myoview ST performed that was low risk 0 showing "diaphragmatic attenuation vs. Old inferior infarct. He now presents for his initial post-op follow-up & feels the best that he has in a long time -- finally pain free.  .  Past Medical History  Diagnosis Date  . Hypertension   . Hyperlipidemia   . Peripheral vascular disease 04/1998    bilateral iliac arteries PTA  and  stenting in 04/30/1998; In 2006-subsequent right common iliac stent occulsion and in stent restenosis, left common iliac  . Coronary artery disease may 2006    ST elevation MI -CATH/PCI-RCA; CATH 2008 FOR INFERIOR ISCHEMIA  . Stroke 15    tia,  . Anxiety   . Arthritis     Prior Cardiac Evaluation and Past Surgical History: Past Surgical History  Procedure Laterality Date  . Doppler echocardiography  06/03/2007    EF =>55%,NORMAL LV FUNCTION  . Nm myocar perf ejection fraction  06/03/07    REST/STRESS -- EF 69% MILD INFEROAPICAL ISCHEMIA NOTED - FOLLOW UP WITH CARDIOLOGIST  . Abdominal aotrogram  07/17/2007    ADDTION WITH CARDIAC  CATH---(INFRARENAL WITH BIFEMORAL RUNOFF )----WIDELY PATENT STENTS TO LEFT AND RIGHT COMMON ILIACS  . Peripheral abd aortic cath  04/04/2005    performed by Dr Rollene Fare--- in [address],in -stent restenosis left iliac. August  2006, kissing balloon  PTA with kissing stents placed in bilateral iliac arteries ;2 overlapping 7.0 x 39 mm followed by 7.0 x 18 mm Gensis stents inth RICA, and a .0 mm Gensis stent in the LICA, noted tobe patent in 2008 cath.  . Lower extremities arterial doppler Bilateral 01/26/2005,09/28/2004    01/26/05-- right ABI -MODERATE,LEFT ABI MILLD -RIGHT CIA STENT OCCLUDED ,RIGHT IIA REVERSIBLE FLOW;LEFT CIA >70%--09/28/2004- RGT ABI'S 76, LFT ABI'S102  . Cardiac catheterization  01/10/2005    DISTAL LEFT MAIN 50% STENOSIS WITH CALCIFIED,100% MID RCA OCCLUSION WHICH  was the infaract-related vessel---- peripheral vascular obstructive disease--100% occlusion rigt common iliac stent:75% stenosis at the otflow of the left iliac,LV systolic fx,EF 64%  . Cardiac catheterization  07/17/2007    40% distal left main stenosis, 80% stenosis RCA distally  just widely patent RCAstent that has a component of six lesion of 40% and superimosed stenoss of 80% due to spasm--widely patent stents left and left  common illiacs  . Coronary angioplasty with stent placement  01/10/2005    abdominal aortography with bifemoral runoff,  PCI-cypher 3.0 x 28 mmSTENTING OF THE MID RCA  . Peripheral arteries disease  09/1999    status post bilatera iliac stents jan 2001. three stents in the left common iliasc ,1 widely patent stent right common iliac, 2 overlapping stents revealed nio more 10% in-stent restenosis in 2008.   Marland Kitchen Eye surgery Bilateral 2000    cataracts  . Spinal fusion  11/2014    SURGERY BY DR Ellene Route  . Nm myoview ltd  11/2014    Low Risk - "Diaphragmatic attenuation", no infarct or ischemia    Interval History: Terry Kemp returns today feeling great after his surgery. He has back surgery and is now no  longer having pain. He feels as well as he has for the last 5 years. He's been able lose maximum weight that he gained it back because he was not exercising. Because he had been out exercising has noted that he gets more and more short of breath with exertion than he had at usually gets better with taking a deep breath. He basically feels it is "getting back to life now. He denies any resting or exertional chest tightness or pressure/pain. Only exertional dyspnea with significant exertion. No heart failure symptoms such as ND, orthopnea or edema.  No palpitations, lightheadedness, dizziness, weakness,syncope/near syncope, or TIA/amaurosis fugax symptoms.  He says that right before I came in, he just got a call from his account about some financial issues and had when he become worse and she is quite upset. He says why his blood pressure is little elevated today.  ROS: A comprehensive was performed. Review of Systems  Constitutional: Negative for malaise/fatigue.  HENT: Negative for congestion.   Respiratory: Negative for cough, sputum production and wheezing.   Cardiovascular: Negative for claudication and leg swelling.  Gastrointestinal: Negative for blood in stool and melena.  Genitourinary: Negative for hematuria.  Musculoskeletal: Positive for back pain (Notably improved).  Neurological: Positive for tingling (Some residual from his back discomfort). Negative for dizziness and headaches.  Endo/Heme/Allergies: Does not bruise/bleed easily.  All other systems reviewed and are negative.   Current Outpatient Prescriptions on File Prior to Visit  Medication Sig Dispense Refill  . ALPRAZolam (XANAX) 1 MG tablet Take 0.5 mg by mouth as needed.     Marland Kitchen aspirin 325 MG tablet Take 1,075 mg by mouth as needed. Prn tid    . atorvastatin (LIPITOR) 20 MG tablet Take 20 mg by mouth daily at 6 PM.     . clopidogrel (PLAVIX) 75 MG tablet Take 75 mg by mouth daily.    . Cyanocobalamin (VITAMIN B-12 CR PO)  Take by mouth daily.    . methocarbamol (ROBAXIN) 500 MG tablet Take 1 tablet (500 mg total) by mouth every 6 (six) hours as needed for muscle spasms. 60 tablet 3  . metoprolol succinate (TOPROL-XL) 25 MG 24 hr tablet Take 25 mg by mouth daily.    . Multiple Vitamin (MULTIVITAMIN) tablet Take 1 tablet by mouth daily.    . nicotine (NICODERM CQ - DOSED IN MG/24 HOURS) 21 mg/24hr patch Place 21 mg onto the skin as needed.     . Omega-3 Fatty Acids (CVS FISH OIL) 1000 MG CAPS Take by mouth.    . oxyCODONE-acetaminophen (PERCOCET/ROXICET) 5-325 MG per tablet Take 1-2 tablets by mouth every 4 (four) hours as needed for moderate pain. 60 tablet 0  . ramipril (ALTACE) 10 MG capsule Take 10 mg by mouth daily.     . sertraline (ZOLOFT) 25 MG  tablet Take 1 tablet (25 mg total) by mouth at bedtime. 30 tablet 3   No current facility-administered medications on file prior to visit.  No Known Allergies   History  Substance Use Topics  . Smoking status: Current Some Day Smoker -- 1.00 packs/day for 38 years    Types: Cigarettes  . Smokeless tobacco: Not on file  . Alcohol Use: 8.4 oz/week    14 Glasses of wine per week   Family History  Problem Relation Age of Onset  . Cancer Mother   . Heart disease Maternal Grandfather     Wt Readings from Last 3 Encounters:  12/21/14 138 lb 14.4 oz (63.005 kg)  11/09/14 143 lb (64.864 kg)  11/04/14 143 lb 12.8 oz (65.227 kg)    PHYSICAL EXAM BP 180/80 mmHg  Pulse 72  Ht 5\' 6"  (1.676 m)  Wt 138 lb 14.4 oz (63.005 kg)  BMI 22.43 kg/m2 General appearance: alert, cooperative, appears stated age, no distress and otherwise healthy-appearing. Well-nourished and well-groomed. HEENT: [place]/AT, EOMI, MMM, anicteric sclera Neck: no adenopathy, no carotid bruit and no JVD Lungs: clear to auscultation bilaterally, normal percussion bilaterally and non-labored Heart: regular rate and rhythm, S1&S2 normal, no murmur, click, rub or gallop; nondisplaced PMI Abdomen:  soft, non-tender; bowel sounds normal; no masses,  no organomegaly;  Extremities: extremities normal, atraumatic, no cyanosis, and edema  Pulses: 2+ and symmetric;  Neurologic/Psych: Mental status: Alert, oriented, thought content appropriate, pleasant mood and affect; Cranial nerves: normal (II-XII grossly intact)    Adult ECG Report - n/a   Recent Labs:    No results found for: CHOL, HDL, LDLCALC, LDLDIRECT, TRIG, CHOLHDL Lab Results  Component Value Date   CREATININE 0.87 10/30/2014   Lab Results  Component Value Date   K 3.8 10/30/2014     ASSESSMENT / PLAN: Problem List Items Addressed This Visit    Brain stem stroke syndrome    On DAPT.      Relevant Medications   amLODipine (NORVASC) 10 MG tablet   CAD S/P RCA DES 2006 - Primary (Chronic)    Asymptomatic. Nonischemic nuclear stress test last month. I'm not convinced at all that was diaphragmatic attenuation versus infarct. Regardless there was no significant ischemia. He has no symptoms. Continue aspirin statin Plavix beta blocker, ACE inhibitor and calcium channel blocker.      Relevant Medications   amLODipine (NORVASC) 10 MG tablet   Current every day smoker - 38 Pk year. (Chronic)    We talked briefly about the importance of smoking cessation, especially with his history of stroke, PAD and CAD. I do not know extensively counseling because of this being a newly established care visit. We will rediscuss in followup visit.      Essential hypertension (Chronic)   Relevant Medications   amLODipine (NORVASC) 10 MG tablet   Hyperlipidemia with target LDL less than 70 (Chronic)    Recent conversion oto Crestor - PCP plans recheck of labs soon. Not currently available for review.      Relevant Medications   amLODipine (NORVASC) 10 MG tablet   Peripheral vascular disease (Chronic)    No active claudication. Iliac stents - unlikely to re-stenose.   May need to get new baseline dopplers during this first year.        Relevant Medications   amLODipine (NORVASC) 10 MG tablet      No orders of the defined types were placed in this encounter.   Meds ordered this encounter  Medications  .  amLODipine (NORVASC) 10 MG tablet    Sig: Take 1 tablet (10 mg total) by mouth daily.    Dispense:  90 tablet    Refill:  3     Followup: 4 -5 months   HARDING, Leonie Green, M.D., M.S. Interventional Cardiologist   Pager # [phone]

## 2014-12-23 ENCOUNTER — Encounter: Payer: Self-pay | Admitting: Cardiology

## 2014-12-23 DIAGNOSIS — Z87891 Personal history of nicotine dependence: Secondary | ICD-10-CM | POA: Insufficient documentation

## 2014-12-23 DIAGNOSIS — F17200 Nicotine dependence, unspecified, uncomplicated: Secondary | ICD-10-CM | POA: Insufficient documentation

## 2014-12-23 DIAGNOSIS — I10 Essential (primary) hypertension: Secondary | ICD-10-CM | POA: Insufficient documentation

## 2014-12-23 NOTE — Assessment & Plan Note (Signed)
We talked briefly about the importance of smoking cessation, especially with his history of stroke, PAD and CAD. I do not know extensively counseling because of this being a newly established care visit. We will rediscuss in followup visit.

## 2014-12-23 NOTE — Assessment & Plan Note (Signed)
Recent conversion oto Crestor - PCP plans recheck of labs soon. Not currently available for review.

## 2014-12-23 NOTE — Assessment & Plan Note (Signed)
Asymptomatic. Nonischemic nuclear stress test last month. I'm not convinced at all that was diaphragmatic attenuation versus infarct. Regardless there was no significant ischemia. He has no symptoms. Continue aspirin statin Plavix beta blocker, ACE inhibitor and calcium channel blocker.

## 2014-12-23 NOTE — Assessment & Plan Note (Signed)
No active claudication. Iliac stents - unlikely to re-stenose.   May need to get new baseline dopplers during this first year.

## 2014-12-23 NOTE — Assessment & Plan Note (Signed)
On DAPT 

## 2015-03-01 ENCOUNTER — Other Ambulatory Visit: Payer: Self-pay

## 2015-04-16 DIAGNOSIS — I10 Essential (primary) hypertension: Secondary | ICD-10-CM | POA: Diagnosis not present

## 2015-04-16 DIAGNOSIS — F419 Anxiety disorder, unspecified: Secondary | ICD-10-CM | POA: Diagnosis not present

## 2015-04-16 DIAGNOSIS — Z23 Encounter for immunization: Secondary | ICD-10-CM | POA: Diagnosis not present

## 2015-04-16 DIAGNOSIS — J449 Chronic obstructive pulmonary disease, unspecified: Secondary | ICD-10-CM | POA: Diagnosis not present

## 2015-05-21 ENCOUNTER — Encounter: Payer: Self-pay | Admitting: Cardiology

## 2015-05-21 ENCOUNTER — Ambulatory Visit (INDEPENDENT_AMBULATORY_CARE_PROVIDER_SITE_OTHER): Payer: Medicare Other | Admitting: Cardiology

## 2015-05-21 VITALS — BP 132/78 | HR 56 | Ht 66.0 in | Wt 129.0 lb

## 2015-05-21 DIAGNOSIS — E785 Hyperlipidemia, unspecified: Secondary | ICD-10-CM | POA: Diagnosis not present

## 2015-05-21 DIAGNOSIS — Z9861 Coronary angioplasty status: Secondary | ICD-10-CM | POA: Diagnosis not present

## 2015-05-21 DIAGNOSIS — Z79899 Other long term (current) drug therapy: Secondary | ICD-10-CM

## 2015-05-21 DIAGNOSIS — Z720 Tobacco use: Secondary | ICD-10-CM

## 2015-05-21 DIAGNOSIS — I2510 Atherosclerotic heart disease of native coronary artery without angina pectoris: Secondary | ICD-10-CM

## 2015-05-21 DIAGNOSIS — I10 Essential (primary) hypertension: Secondary | ICD-10-CM

## 2015-05-21 DIAGNOSIS — F17200 Nicotine dependence, unspecified, uncomplicated: Secondary | ICD-10-CM

## 2015-05-21 DIAGNOSIS — G463 Brain stem stroke syndrome: Secondary | ICD-10-CM

## 2015-05-21 DIAGNOSIS — I739 Peripheral vascular disease, unspecified: Secondary | ICD-10-CM

## 2015-05-21 LAB — LIPID PANEL
CHOL/HDL RATIO: 3.5 ratio (ref <=5.0)
Cholesterol: 173 mg/dL (ref 125–200)
HDL: 50 mg/dL (ref >=40)
LDL Cholesterol: 102 mg/dL (ref <130)
TRIGLYCERIDES: 105 mg/dL (ref <150)
VLDL: 21 mg/dL (ref <30)

## 2015-05-21 LAB — COMPREHENSIVE METABOLIC PANEL
ALBUMIN: 4.5 g/dL (ref 3.6–5.1)
ALK PHOS: 79 U/L (ref 40–115)
ALT: 13 U/L (ref 9–46)
AST: 17 U/L (ref 10–35)
BILIRUBIN TOTAL: 0.6 mg/dL (ref 0.2–1.2)
BUN: 8 mg/dL (ref 7–25)
CALCIUM: 9.1 mg/dL (ref 8.6–10.3)
CO2: 28 mmol/L (ref 20–31)
Chloride: 96 mmol/L — ABNORMAL LOW (ref 98–110)
Creat: 0.76 mg/dL (ref 0.70–1.25)
GLUCOSE: 88 mg/dL (ref 65–99)
Potassium: 4.6 mmol/L (ref 3.5–5.3)
Sodium: 133 mmol/L — ABNORMAL LOW (ref 135–146)
Total Protein: 6.8 g/dL (ref 6.1–8.1)

## 2015-05-21 NOTE — Progress Notes (Signed)
PCP:  Melinda Crutch, MD  Clinic Note: Chief Complaint  Patient presents with  . Follow-up    Patient has no complaints.    HPI: Terry Kemp is a 69 y.o. male with a PMH below who presents today for 4-5 month f/u -- mostly to reassess BP. Has h/o CVA & PAD as well as HTN & HLD.  DREZDEN SEITZINGER was last seen in April  Recent Hospitalizations: Dr. Ellene Route - back Sgx March.  Studies Reviewed: Myoview in March 2016 - no ischemia or infarction.  Normal EF   Interval History: Feeling & doing great ever since back surgery. Since Back Sgx - able to be more active - lost weight.  Eating more, but working harder -- works [employer], [place] etc.  Feels much better.  Walks well over 1-1/5 miles daily (hilly terrain). No angina CP or SOB with rest or exertion.  No claudication.  NO TIA/Amaurosis Fugax Sx.  NO Syncope/near syncope No chest pain or shortness of breath with rest or exertion. No PND, orthopnea or edema. No palpitations, lightheadedness, dizziness, weakness or syncope/near syncope. No TIA/amaurosis fugax symptoms. No melena, hematochezia, hematuria, or epstaxis.  Recovering from "summer bronchitis spell a month or so ago".  Using nicoderm patch & chewing the gum - but still smoking.  Past Medical History  Diagnosis Date  . Hypertension   . Hyperlipidemia   . Peripheral vascular disease 04/1998    bilateral iliac arteries PTA  and  stenting in 04/30/1998; In 2006-subsequent right common iliac stent occulsion and in stent restenosis, left common iliac  . Coronary artery disease may 2006    ST elevation MI -CATH/PCI-RCA; CATH 2008 FOR INFERIOR ISCHEMIA  . Stroke 15    tia,  . Anxiety   . Arthritis     Past Surgical History  Procedure Laterality Date  . Doppler echocardiography  06/03/2007    EF =>55%,NORMAL LV FUNCTION  . Nm myocar perf ejection fraction  06/03/07    REST/STRESS -- EF 69% MILD INFEROAPICAL ISCHEMIA NOTED - FOLLOW UP WITH  CARDIOLOGIST  . Abdominal aotrogram  07/17/2007    ADDTION WITH CARDIAC CATH---(INFRARENAL WITH BIFEMORAL RUNOFF )----WIDELY PATENT STENTS TO LEFT AND RIGHT COMMON ILIACS  . Peripheral abd aortic cath  04/04/2005    performed by Dr Rollene Fare--- in [address],in -stent restenosis left iliac. August  2006, kissing balloon  PTA with kissing stents placed in bilateral iliac arteries ;2 overlapping 7.0 x 39 mm followed by 7.0 x 18 mm Gensis stents inth RICA, and a .0 mm Gensis stent in the LICA, noted tobe patent in 2008 cath.  . Lower extremities arterial doppler Bilateral 01/26/2005,09/28/2004    01/26/05-- right ABI -MODERATE,LEFT ABI MILLD -RIGHT CIA STENT OCCLUDED ,RIGHT IIA REVERSIBLE FLOW;LEFT CIA >70%--09/28/2004- RGT ABI'S 76, LFT ABI'S102  . Cardiac catheterization  01/10/2005    DISTAL LEFT MAIN 50% STENOSIS WITH CALCIFIED,100% MID RCA OCCLUSION WHICH  was the infaract-related vessel---- peripheral vascular obstructive disease--100% occlusion rigt common iliac stent:75% stenosis at the otflow of the left iliac,LV systolic fx,EF 16%  . Cardiac catheterization  07/17/2007    40% distal left main stenosis, 80% stenosis RCA distally  just widely patent RCAstent that has a component of six lesion of 40% and superimosed stenoss of 80% due to spasm--widely patent stents left and left  common illiacs  . Coronary angioplasty with stent placement  01/10/2005    abdominal aortography with bifemoral runoff, PCI-cypher 3.0 x 28 mmSTENTING OF THE MID  RCA  . Peripheral arteries disease  09/1999    status post bilatera iliac stents jan 2001. three stents in the left common iliasc ,1 widely patent stent right common iliac, 2 overlapping stents revealed nio more 10% in-stent restenosis in 2008.   [facility] surgery Bilateral 2000    cataracts  . Spinal fusion  11/2014    SURGERY BY DR Ellene Route  . Nm myoview ltd  11/2014    Low Risk - "Diaphragmatic attenuation", no infarct or ischemia    No claudication.  ROS: A  comprehensive was performed. Review of Systems  Constitutional: Positive for weight loss (Intentional).  Respiratory: Positive for shortness of breath (with intense exertion) and wheezing (for a while when recovering from his "bronchitis"- not now). Negative for cough (recovered from cold-URI/bronchits).   Cardiovascular: Negative for claudication and leg swelling.  Gastrointestinal: Negative for blood in stool and melena.  Genitourinary: Negative for hematuria.  Musculoskeletal: Positive for back pain (much improved - still has some "twinges").  Neurological: Negative for dizziness and headaches.    Prior to Admission medications -- corrected medications  Medication Sig Start Date End Date Taking? Authorizing Provider  ALPRAZolam Duanne Moron) 1 MG tablet Take 0.5 mg by mouth as needed.  02/16/14  Yes Historical Provider, MD  amLODipine (NORVASC) 10 MG tablet Take 1 tablet (10 mg total) by mouth daily. 12/21/14  Yes Leonie Man, MD  acetaminophen Take 1,000 mg by mouth as needed. Prn tid   Yes Historical Provider, MD  atorvastatin (LIPITOR) 20 MG tablet Take 20 mg by mouth daily at 6 PM.  03/25/14  Yes Historical Provider, MD  clopidogrel (PLAVIX) 75 MG tablet Take 75 mg by mouth daily.   Yes Historical Provider, MD  Cyanocobalamin (VITAMIN B-12 CR PO) Take by mouth daily.   Yes Historical Provider, MD  metoprolol succinate (TOPROL-XL) 25 MG 24 hr tablet Take 25 mg by mouth daily.   Yes Historical Provider, MD  Multiple Vitamin (MULTIVITAMIN) tablet Take 1 tablet by mouth daily.   Yes Historical Provider, MD  nicotine (NICODERM CQ - DOSED IN MG/24 HOURS) 21 mg/24hr patch Place 21 mg onto the skin as needed.    Yes Historical Provider, MD  Omega-3 Fatty Acids ([facility] FISH OIL) 1000 MG CAPS Take by mouth.   Yes Historical Provider, MD  ramipril (ALTACE) 10 MG capsule Take 10 mg by mouth daily.  10/16/14  Yes Historical Provider, MD   No Known Allergies   Social History   Social History  .  Marital Status: Single    Spouse Name: N/A  . Number of Children: 2  . Years of Education: college   Social History Main Topics  . Smoking status: Current Some Day Smoker -- 1.00 packs/day for 38 years    Types: Cigarettes  . Smokeless tobacco: None  . Alcohol Use: 8.4 oz/week    14 Glasses of wine per week  . Drug Use: No  . Sexual Activity: Not Asked   Other Topics Concern  . None   Social History Narrative   Family History  Problem Relation Age of Onset  . Cancer Mother   . Heart disease Maternal Grandfather     Wt Readings from Last 3 Encounters:  05/21/15 129 lb (58.514 kg)  12/21/14 138 lb 14.4 oz (63.005 kg)  11/09/14 143 lb (64.864 kg)  - back exercising.  PHYSICAL EXAM BP 132/78 mmHg  Pulse 56  Ht 5\' 6"  (1.676 m)  Wt 129 lb (58.514 kg)  BMI 20.83 kg/m2 General appearance: alert, cooperative, appears stated age, no distress and otherwise healthy-appearing. Well-nourished and well-groomed. HEENT: [place]/AT, EOMI, MMM, anicteric sclera Neck: no adenopathy, no carotid bruit and no JVD Lungs: clear to auscultation bilaterally, normal percussion bilaterally and non-labored Heart: regular rate and rhythm, S1&S2 normal, no murmur, click, rub or gallop; nondisplaced PMI Abdomen: soft, non-tender; bowel sounds normal; no masses, no organomegaly;  Extremities: extremities normal, atraumatic, no cyanosis, and edema  Pulses: 2+ and symmetric;  Neurologic/Psych: Mental status: Alert, oriented, thought content appropriate, pleasant mood and affect; Cranial nerves: normal (II-XII grossly intact)    Adult ECG Report - not performed  Other studies Reviewed: Additional studies/ records that were reviewed today include:  Recent Labs:  Due for Lipids, Chem 10   ASSESSMENT / PLAN: Problem List Items Addressed This Visit    Brain stem stroke syndrome    On Plavix. Statin and blood pressure control.      CAD S/P RCA DES 2006 - Primary (Chronic)    Asymptomatic cardiac  standpoint -- no active angina or heart failure symptoms despite increasing his exercise level. Nonischemic Myoview in March 2016 On Plavix. We stopped aspirin with the exception of when necessary. Remains on pravastatin, ACE inhibitor, beta blocker and calcium channel blocker.       Relevant Orders   Lipid panel (Completed)   Comprehensive metabolic panel (Completed)   Essential hypertension (Chronic)    Well-controlled now on full dose amlodipine. Cannot increase beta blocker due to bradycardia. ACE inhibitor is a stable dose.      Relevant Orders   Lipid panel (Completed)   Comprehensive metabolic panel (Completed)   Hyperlipidemia with target LDL less than 70 (Chronic)    Unfortunately, I don't have any recent lipid levels now. He is on Crestor. Will order lipid panel and chemistry panel.      Relevant Orders   Lipid panel (Completed)   Comprehensive metabolic panel (Completed)   Peripheral vascular disease (Chronic)    No active claudication. He has iliac stents. Will order baseline Dopplers after next visit      Relevant Orders   Lipid panel (Completed)   Comprehensive metabolic panel (Completed)   Smoker    I talked briefly to him about cessation. Did not seem very amenable to considering stopping. We'll continue to address at followup appointments.       Other Visit Diagnoses    Polypharmacy        Relevant Orders    Lipid panel (Completed)    Comprehensive metabolic panel (Completed)       Current medicines are reviewed at length with the patient today. (+/- concerns) none The following changes have been made: none Studies Ordered:   Orders Placed This Encounter  Procedures  . Lipid panel  . Comprehensive metabolic panel    ROV 1 yr  Leonie Man, M.D., M.S. Interventional Cardiologist   Pager # [phone]   Brain stem stroke syndrome     On DAPT.      Relevant Medications   amLODipine (NORVASC) 10 MG tablet   CAD S/P  RCA DES 2006 - Primary (Chronic)    Asymptomatic. Nonischemic nuclear stress test last month. I'm not convinced at all that was diaphragmatic attenuation versus infarct. Regardless there was no significant ischemia. He has no symptoms. Continue aspirin statin Plavix beta blocker, ACE inhibitor and calcium channel blocker.      Relevant Medications   amLODipine (NORVASC) 10 MG tablet   Current every  day smoker - 38 Pk year. (Chronic)    We talked briefly about the importance of smoking cessation, especially with his history of stroke, PAD and CAD. I do not know extensively counseling because of this being a newly established care visit. We will rediscuss in followup visit.      Essential hypertension (Chronic)   Relevant Medications   amLODipine (NORVASC) 10 MG tablet   Hyperlipidemia with target LDL less than 70 (Chronic)    Recent conversion oto Crestor - PCP plans recheck of labs soon. Not currently available for review.      Relevant Medications   amLODipine (NORVASC) 10 MG tablet   Peripheral vascular disease (Chronic)    No active claudication. Iliac stents - unlikely to re-stenose.  May need to get new baseline dopplers during this first year.      Relevant Medications   amLODipine (NORVASC) 10 MG tablet      No orders of the defined types were placed in this encounter.  Meds ordered this encounter  Medications  . amLODipine (NORVASC) 10 MG tablet    Sig: Take 1 tablet (10 mg total) by mouth daily.    Dispense: 90 tablet    Refill: 3

## 2015-05-21 NOTE — Patient Instructions (Signed)
No changes in current medications.  Labs- lipid , cmp  Your physician wants you to follow-up in 12 months with Dr Ellyn Hack.  You will receive a reminder letter in the mail two months in advance. If you don't receive a letter, please call our office to schedule the follow-up appointment.

## 2015-05-23 ENCOUNTER — Encounter: Payer: Self-pay | Admitting: Cardiology

## 2015-05-23 NOTE — Assessment & Plan Note (Addendum)
Asymptomatic cardiac standpoint -- no active angina or heart failure symptoms despite increasing his exercise level. Nonischemic Myoview in March 2016 On Plavix. We stopped aspirin with the exception of when necessary. Remains on pravastatin, ACE inhibitor, beta blocker and calcium channel blocker.

## 2015-05-23 NOTE — Assessment & Plan Note (Signed)
Well-controlled now on full dose amlodipine. Cannot increase beta blocker due to bradycardia. ACE inhibitor is a stable dose.

## 2015-05-23 NOTE — Assessment & Plan Note (Signed)
No active claudication. He has iliac stents. Will order baseline Dopplers after next visit

## 2015-05-23 NOTE — Assessment & Plan Note (Signed)
On Plavix. Statin and blood pressure control.

## 2015-05-23 NOTE — Assessment & Plan Note (Signed)
I talked briefly to him about cessation. Did not seem very amenable to considering stopping. We'll continue to address at followup appointments.

## 2015-05-23 NOTE — Assessment & Plan Note (Signed)
Unfortunately, I don't have any recent lipid levels now. He is on Crestor. Will order lipid panel and chemistry panel.

## 2015-06-02 ENCOUNTER — Telehealth: Payer: Self-pay | Admitting: *Deleted

## 2015-06-02 NOTE — Telephone Encounter (Signed)
-----   Message from Leonie Man, MD sent at 05/31/2015  6:30 PM EDT ----- Chemistry panel looks relatively normal. Cholesterol levels as follows: Total cholesterol 173, cholesterol 105 --> Both are relatively normal. HDL which is the good cholesterol is 50 which is also normal. LDL however is 102. Your target is less than 70.  I am not clear which medicine you're taking for cholesterol this time whether his Lipitor (atorvastatin) or Crestor (rosuvastatin) --> Anyway, I think we need to double the dose  pls fwd MQ:TTCNGFR Harrington Challenger, MD

## 2015-06-02 NOTE — Telephone Encounter (Signed)
LEFT MESSAGE TO CALL BACK- REGARDING LABS

## 2015-06-24 MED ORDER — ATORVASTATIN CALCIUM 40 MG PO TABS: 40.0000 mg | ORAL_TABLET | Freq: Every day | ORAL | Status: DC

## 2015-06-24 NOTE — Telephone Encounter (Signed)
Spoke to patient. Result given . Verbalized understanding Increase to 40 mg atorvastatin E-sent to pharmacy

## 2015-08-16 ENCOUNTER — Encounter (HOSPITAL_COMMUNITY): Payer: Self-pay | Admitting: Family Medicine

## 2015-08-16 ENCOUNTER — Emergency Department (HOSPITAL_COMMUNITY): Payer: Medicare Other

## 2015-08-16 ENCOUNTER — Emergency Department (HOSPITAL_COMMUNITY)
Admission: EM | Admit: 2015-08-16 | Discharge: 2015-08-16 | Disposition: A | Discharge status: Home / Self Care | Payer: Medicare Other | Attending: Emergency Medicine | Admitting: Emergency Medicine

## 2015-08-16 DIAGNOSIS — I10 Essential (primary) hypertension: Secondary | ICD-10-CM | POA: Insufficient documentation

## 2015-08-16 DIAGNOSIS — Z7982 Long term (current) use of aspirin: Secondary | ICD-10-CM | POA: Insufficient documentation

## 2015-08-16 DIAGNOSIS — R2689 Other abnormalities of gait and mobility: Secondary | ICD-10-CM | POA: Insufficient documentation

## 2015-08-16 DIAGNOSIS — I2510 Atherosclerotic heart disease of native coronary artery without angina pectoris: Secondary | ICD-10-CM | POA: Diagnosis not present

## 2015-08-16 DIAGNOSIS — F419 Anxiety disorder, unspecified: Secondary | ICD-10-CM | POA: Diagnosis not present

## 2015-08-16 DIAGNOSIS — I639 Cerebral infarction, unspecified: Secondary | ICD-10-CM | POA: Diagnosis not present

## 2015-08-16 DIAGNOSIS — Z7902 Long term (current) use of antithrombotics/antiplatelets: Secondary | ICD-10-CM | POA: Insufficient documentation

## 2015-08-16 DIAGNOSIS — Z8673 Personal history of transient ischemic attack (TIA), and cerebral infarction without residual deficits: Secondary | ICD-10-CM | POA: Insufficient documentation

## 2015-08-16 DIAGNOSIS — F17210 Nicotine dependence, cigarettes, uncomplicated: Secondary | ICD-10-CM | POA: Insufficient documentation

## 2015-08-16 DIAGNOSIS — Z79899 Other long term (current) drug therapy: Secondary | ICD-10-CM | POA: Diagnosis not present

## 2015-08-16 DIAGNOSIS — R2681 Unsteadiness on feet: Secondary | ICD-10-CM | POA: Diagnosis not present

## 2015-08-16 DIAGNOSIS — R4701 Aphasia: Secondary | ICD-10-CM | POA: Diagnosis not present

## 2015-08-16 DIAGNOSIS — M1990 Unspecified osteoarthritis, unspecified site: Secondary | ICD-10-CM | POA: Insufficient documentation

## 2015-08-16 DIAGNOSIS — E785 Hyperlipidemia, unspecified: Secondary | ICD-10-CM | POA: Diagnosis not present

## 2015-08-16 DIAGNOSIS — R200 Anesthesia of skin: Secondary | ICD-10-CM | POA: Diagnosis not present

## 2015-08-16 DIAGNOSIS — R51 Headache: Secondary | ICD-10-CM | POA: Diagnosis not present

## 2015-08-16 DIAGNOSIS — R531 Weakness: Secondary | ICD-10-CM | POA: Diagnosis present

## 2015-08-16 LAB — I-STAT CHEM 8, ED
BUN: 8 mg/dL (ref 6–20)
CALCIUM ION: 1.08 mmol/L — AB (ref 1.13–1.30)
Chloride: 93 mmol/L — ABNORMAL LOW (ref 101–111)
Creatinine, Ser: 0.6 mg/dL — ABNORMAL LOW (ref 0.61–1.24)
Glucose, Bld: 130 mg/dL — ABNORMAL HIGH (ref 65–99)
HEMATOCRIT: 48 % (ref 39.0–52.0)
Hemoglobin: 16.3 g/dL (ref 13.0–17.0)
Potassium: 3.5 mmol/L (ref 3.5–5.1)
SODIUM: 130 mmol/L — AB (ref 135–145)
TCO2: 24 mmol/L (ref 0–100)

## 2015-08-16 LAB — DIFFERENTIAL
BASOS ABS: 0 10*3/uL (ref 0.0–0.1)
BASOS PCT: 0 %
EOS ABS: 0.2 10*3/uL (ref 0.0–0.7)
EOS PCT: 4 %
Lymphocytes Relative: 21 %
Lymphs Abs: 0.8 10*3/uL (ref 0.7–4.0)
Monocytes Absolute: 0.9 10*3/uL (ref 0.1–1.0)
Monocytes Relative: 22 %
NEUTROS PCT: 53 %
Neutro Abs: 2.1 10*3/uL (ref 1.7–7.7)

## 2015-08-16 LAB — COMPREHENSIVE METABOLIC PANEL
ALBUMIN: 3.8 g/dL (ref 3.5–5.0)
ALT: 18 U/L (ref 17–63)
ANION GAP: 7 (ref 5–15)
AST: 21 U/L (ref 15–41)
Alkaline Phosphatase: 77 U/L (ref 38–126)
BUN: 8 mg/dL (ref 6–20)
CHLORIDE: 97 mmol/L — AB (ref 101–111)
CO2: 24 mmol/L (ref 22–32)
Calcium: 8.7 mg/dL — ABNORMAL LOW (ref 8.9–10.3)
Creatinine, Ser: 0.65 mg/dL (ref 0.61–1.24)
GFR calc Af Amer: >60 mL/min (ref >60)
GLUCOSE: 131 mg/dL — AB (ref 65–99)
POTASSIUM: 3.6 mmol/L (ref 3.5–5.1)
Sodium: 128 mmol/L — ABNORMAL LOW (ref 135–145)
TOTAL PROTEIN: 6 g/dL — AB (ref 6.5–8.1)
Total Bilirubin: 0.5 mg/dL (ref 0.3–1.2)

## 2015-08-16 LAB — CBC
HCT: 43.8 % (ref 39.0–52.0)
Hemoglobin: 15.2 g/dL (ref 13.0–17.0)
MCH: 32.1 pg (ref 26.0–34.0)
MCHC: 34.7 g/dL (ref 30.0–36.0)
MCV: 92.6 fL (ref 78.0–100.0)
PLATELETS: 184 10*3/uL (ref 150–400)
RBC: 4.73 MIL/uL (ref 4.22–5.81)
RDW: 12.1 % (ref 11.5–15.5)
WBC: 3.9 10*3/uL — ABNORMAL LOW (ref 4.0–10.5)

## 2015-08-16 LAB — PROTIME-INR
INR: 0.96 (ref 0.00–1.49)
PROTHROMBIN TIME: 13 s (ref 11.6–15.2)

## 2015-08-16 LAB — APTT: APTT: 33 s (ref 24–37)

## 2015-08-16 LAB — CBG MONITORING, ED: GLUCOSE-CAPILLARY: 101 mg/dL — AB (ref 65–99)

## 2015-08-16 LAB — I-STAT TROPONIN, ED: TROPONIN I, POC: 0.01 ng/mL (ref 0.00–0.08)

## 2015-08-16 MED ORDER — SODIUM CHLORIDE 0.9 % IV BOLUS (SEPSIS): 1000.0000 mL | Freq: Once | INTRAVENOUS | Status: DC

## 2015-08-16 NOTE — ED Notes (Addendum)
Pt sitting at end of bed and called RN into room pt states. "I'm not getting any care, nobody is doing anything for me! I haven't eaten and I want to go home!". This RN explained all of the testing the pt has already had up to this point and stated that the MD would have to be consulted prior to giving the pt any food. The pt again stated "Nobody is doing anything!", this RN again explained all of the testing that has been done and that we will have to see what the MD is planning to do and that he would have food as soon as permission was given. The pt said "Well apparently I am not that sick and I might as well go home and fend for myself!" the pt begins taking his monitioring equipment off, this RN asks the pt if he is requesting to leave AMA and the pt states "Yes!"   Dr Vanita Panda notifed.

## 2015-08-16 NOTE — ED Notes (Signed)
MD at bedside. 

## 2015-08-16 NOTE — ED Notes (Signed)
Pt not in room, clothing still in room.

## 2015-08-16 NOTE — ED Notes (Signed)
Pt here for right sided weakness that started this morning about 6 am. sts was very minor and worsened throughout the day. Some slurred speech. Headache and numbness on right side.,

## 2015-08-16 NOTE — Discharge Instructions (Signed)
As discussed, it is important that you follow up as soon as possible with your physician for continued management of your condition.  Please make sure to speak with Dr. Leonie Man tomorrow.  If you develop any new, or concerning changes in your condition, please return to the emergency department immediately.

## 2015-08-16 NOTE — ED Provider Notes (Signed)
CSN: [id number]     Arrival date & time 08/16/15  1640 History   First MD Initiated Contact with Patient 08/16/15 1952     Chief Complaint  Patient presents with  . Weakness  . Aphasia      HPI  Patient presents with concern of gait instability. Onset was about 12 hours ago. Since onset symptoms of been persistent, with no other new weakness. No new confusion, disorientation, no falling, no nausea, vomiting, other changes from baseline. Patient acknowledges a history of prior stroke, states that he takes Plavix. Since onset no clear alleviating or exacerbating factors.  Past Medical History  Diagnosis Date  . Hypertension   . Hyperlipidemia   . Peripheral vascular disease ([place]) 04/1998    bilateral iliac arteries PTA  and  stenting in 04/30/1998; In 2006-subsequent right common iliac stent occulsion and in stent restenosis, left common iliac  . Coronary artery disease may 2006    ST elevation MI -CATH/PCI-RCA; CATH 2008 FOR INFERIOR ISCHEMIA  . Stroke ([place]) 13    tia,  . Anxiety   . Arthritis    Past Surgical History  Procedure Laterality Date  . Doppler echocardiography  06/03/2007    EF =>55%,NORMAL LV FUNCTION  . Nm myocar perf ejection fraction  06/03/07    REST/STRESS -- EF 69% MILD INFEROAPICAL ISCHEMIA NOTED - FOLLOW UP WITH CARDIOLOGIST  . Abdominal aotrogram  07/17/2007    ADDTION WITH CARDIAC CATH---(INFRARENAL WITH BIFEMORAL RUNOFF )----WIDELY PATENT STENTS TO LEFT AND RIGHT COMMON ILIACS  . Peripheral abd aortic cath  04/04/2005    performed by Dr Rollene Fare--- in [address],in -stent restenosis left iliac. August  2006, kissing balloon  PTA with kissing stents placed in bilateral iliac arteries ;2 overlapping 7.0 x 39 mm followed by 7.0 x 18 mm Gensis stents inth RICA, and a .0 mm Gensis stent in the LICA, noted tobe patent in 2008 cath.  . Lower extremities arterial doppler Bilateral 01/26/2005,09/28/2004    01/26/05-- right ABI -MODERATE,LEFT ABI MILLD -RIGHT CIA  STENT OCCLUDED ,RIGHT IIA REVERSIBLE FLOW;LEFT CIA >70%--09/28/2004- RGT ABI'S 76, LFT ABI'S102  . Cardiac catheterization  01/10/2005    DISTAL LEFT MAIN 50% STENOSIS WITH CALCIFIED,100% MID RCA OCCLUSION WHICH  was the infaract-related vessel---- peripheral vascular obstructive disease--100% occlusion rigt common iliac stent:75% stenosis at the otflow of the left iliac,LV systolic fx,EF [id number]  . Cardiac catheterization  07/17/2007    40% distal left main stenosis, 80% stenosis RCA distally  just widely patent RCAstent that has a component of six lesion of 40% and superimosed stenoss of 80% due to spasm--widely patent stents left and left  common illiacs  . Coronary angioplasty with stent placement  01/10/2005    abdominal aortography with bifemoral runoff, PCI-cypher 3.0 x 28 mmSTENTING OF THE MID RCA  . Peripheral arteries disease  09/1999    status post bilatera iliac stents jan 2001. three stents in the left common iliasc ,1 widely patent stent right common iliac, 2 overlapping stents revealed nio more 10% in-stent restenosis in 2008.   [facility] surgery Bilateral 2000    cataracts  . Spinal fusion  11/2014    SURGERY BY DR Ellene Route  . Nm myoview ltd  11/2014    Low Risk - "Diaphragmatic attenuation", no infarct or ischemia   Family History  Problem Relation Age of Onset  . Cancer Mother   . Heart disease Maternal Grandfather    Social History  Substance Use Topics  . Smoking status: Current  Some Day Smoker -- 1.00 packs/day for 38 years    Types: Cigarettes  . Smokeless tobacco: None  . Alcohol Use: 8.4 oz/week    14 Glasses of wine per week    Review of Systems  Constitutional:       Per HPI, otherwise negative  HENT:       Per HPI, otherwise negative  Respiratory:       Per HPI, otherwise negative  Cardiovascular:       Per HPI, otherwise negative  Gastrointestinal: Negative for vomiting.  Endocrine:       Negative aside from HPI  Genitourinary:       Neg aside from HPI    Musculoskeletal:       Per HPI, otherwise negative  Skin: Negative.   Neurological: Positive for weakness. Negative for syncope and headaches.      Allergies  Review of patient's allergies indicates no known allergies.  Home Medications   Prior to Admission medications   Medication Sig Start Date End Date Taking? Authorizing Provider  acetaminophen (TYLENOL) 500 MG tablet Take 1,000 mg by mouth every 8 (eight) hours as needed for moderate pain.   Yes Historical Provider, MD  ALPRAZolam Duanne Moron) 1 MG tablet Take 0.5 mg by mouth as needed for anxiety or sleep.  02/16/14  Yes Historical Provider, MD  amLODipine (NORVASC) 10 MG tablet Take 1 tablet (10 mg total) by mouth daily. 12/21/14  Yes Leonie Man, MD  aspirin 325 MG tablet Take 1,075 mg by mouth as needed. Prn tid   Yes Historical Provider, MD  atorvastatin (LIPITOR) 40 MG tablet Take 1 tablet (40 mg total) by mouth daily. 06/24/15  Yes Leonie Man, MD  clopidogrel (PLAVIX) 75 MG tablet Take 75 mg by mouth daily.   Yes Historical Provider, MD  Cyanocobalamin (VITAMIN B-12 CR PO) Take by mouth daily.   Yes Historical Provider, MD  metoprolol succinate (TOPROL-XL) 25 MG 24 hr tablet Take 25 mg by mouth daily.   Yes Historical Provider, MD  Multiple Vitamin (MULTIVITAMIN) tablet Take 1 tablet by mouth daily.   Yes Historical Provider, MD  Omega-3 Fatty Acids ([employer] FISH OIL) 1000 MG CAPS Take by mouth.   Yes Historical Provider, MD  ramipril (ALTACE) 10 MG capsule Take 10 mg by mouth daily.  10/16/14  Yes Historical Provider, MD   BP 175/85 mmHg  Pulse 63  Temp(Src) 98.3 F (36.8 C) (Oral)  Resp 19  SpO2 99% Physical Exam  Constitutional: He is oriented to person, place, and time. He appears well-developed. No distress.  HENT:  Head: Normocephalic and atraumatic.  Eyes: Conjunctivae and EOM are normal.  Cardiovascular: Normal rate and regular rhythm.   Pulmonary/Chest: Effort normal. No stridor. No respiratory distress.   Abdominal: He exhibits no distension.  Musculoskeletal: He exhibits no edema.  Neurological: He is alert and oriented to person, place, and time. He displays no tremor. No cranial nerve deficit or sensory deficit. He displays no seizure activity. Gait abnormal. Coordination normal.  Skin: Skin is warm and dry.  Psychiatric: He has a normal mood and affect.  Nursing note and vitals reviewed.   ED Course  Procedures (including critical care time) Labs Review Labs Reviewed  CBC - Abnormal; Notable for the following:    WBC 3.9 (*)    All other components within normal limits  COMPREHENSIVE METABOLIC PANEL - Abnormal; Notable for the following:    Sodium 128 (*)    Chloride 97 (*)  Glucose, Bld 131 (*)    Calcium 8.7 (*)    Total Protein 6.0 (*)    All other components within normal limits  CBG MONITORING, ED - Abnormal; Notable for the following:    Glucose-Capillary 101 (*)    All other components within normal limits  I-STAT CHEM 8, ED - Abnormal; Notable for the following:    Sodium 130 (*)    Chloride 93 (*)    Creatinine, Ser 0.60 (*)    Glucose, Bld 130 (*)    Calcium, Ion 1.08 (*)    All other components within normal limits  PROTIME-INR  APTT  DIFFERENTIAL  I-STAT TROPOININ, ED    Imaging Review Ct Head Wo Contrast  08/16/2015  CLINICAL DATA:  69 year old male with slurred speech and right-sided weakness since 8 a.m. this morning. Headache and numbness on the right side. History of prior TIA. EXAM: CT HEAD WITHOUT CONTRAST TECHNIQUE: Contiguous axial images were obtained from the base of the skull through the vertex without intravenous contrast. COMPARISON:  Brain MRI 04/29/2014. FINDINGS: Several well-defined areas of low attenuation in noted in the head of the caudate nuclei bilaterally, in the right external capsule, and in the left cerebellar hemisphere, compatible with areas of remote ischemia. No acute intracranial abnormalities. Specifically, no evidence of  acute intracranial hemorrhage, no definite findings of acute/subacute cerebral ischemia, no mass, mass effect, hydrocephalus or abnormal intra or extra-axial fluid collections. Visualized paranasal sinuses and mastoids are generally well pneumatized, with exception of some multifocal mucosal thickening throughout the ethmoid sinuses, and mucosal retention cysts or polyps in the frontal sinuses bilaterally. No acute displaced skull fractures are identified. IMPRESSION: 1. No acute intracranial abnormalities. 2. Sequela of old lacunar infarcts in the basal ganglia bilaterally, right external capsule, and left cerebellar hemisphere. 3. Paranasal sinus disease, as above. Electronically Signed   By: Vinnie Langton M.D.   On: 08/16/2015 18:37   I have personally reviewed and evaluated these images and lab results as part of my medical decision-making.   EKG Interpretation   Date/Time:  Monday August 16 2015 19:30:46 EST Ventricular Rate:  66 PR Interval:  141 QRS Duration: 90 QT Interval:  424 QTC Calculation: 444 R Axis:   77 Text Interpretation:  Sinus rhythm Sinus rhythm Artifact Normal ECG  Confirmed by Carmin Muskrat  MD ([id number]) on 08/16/2015 9:24:10 PM     On repeat exam with discussed all findings at length. Specifically discussed the CT scan without hemorrhage. Patient acknowledges prior stroke, and we discussed the need for repeat MRI evaluation. Patient declined my recommendation to stay here for MRI. He stated that he had to leave. I counseled him on the need for this study again, but the patient states he will follow-up with his neurologist tomorrow to obtain this, and have further evaluation. He also acknowledges taking Plavix only every other day.   MDM   Final diagnoses:  Gait instability   Patient with history of prior stroke, now on Plavix  presents with one day of gait instability. You, the patient does have some gait difficulty, but no other new neurologic  changes. CT did not demonstrate hemorrhagic stroke, but concern for new ischemic event, the patient had MRI planned. He would not stay for this study, was counseled on the need to follow-up with his outpatient providers to obtain it, in the need to return here for any concerning changes in his condition.   Carmin Muskrat, MD 08/16/15 2129

## 2015-08-17 ENCOUNTER — Telehealth: Payer: Self-pay | Admitting: Neurology

## 2015-08-17 NOTE — Telephone Encounter (Signed)
Rn talk to patient about scheduling appt with hospital follow up. Pt stated he was upset at [facility] and the whole staff there. Pt stated he wanted a MRI but was tired of waiting.Rn explain that per ED notes he was offer a MRI but left. Rn schedule pt for a new hospital follow up. Rn explain to patient that Dr.Sethi is out of the office till January 2017. Rn also explain he was last seen in 2015 by NP. A message will be sent to Dr.Sethi about pt requesting MRi before the appt. Pt stated he was living in [place] last year, but is back in [place].

## 2015-08-17 NOTE — Telephone Encounter (Addendum)
Patient is calling and states he had a stroke yesterday and went to [facility]. He said they did a CT scan there but never ordered an MRI.  He would like to get an MRI here and then be seen by Dr. Leonie Man.  Please call. Patient states that he had a light stroke and went to Dr. Ellyn Hack @ 4:00 yesterday afternoon.  He stated doctor advised him to go the [facility].  He said he went to [facility] but got very poor treatment, didn't even have a place to go to the bathroom, he had a CT scan but was told he needed an MRI "but they didn't give him one."  He seemed irritated and his story did not make a lot of sense.  He then said he told them at the ER that he would take care of it by getting Dr. Leonie Man to do the MRI and then make an appointment to talk with him about the results.  I sounded like he just got irritated a MC.  I did not make an appointment for him to see Dr. Leonie Man but told him Dr. Leonie Man would have to order the MRI.  Thanks!

## 2015-08-18 NOTE — Telephone Encounter (Signed)
Offer patient appointment to be seen by any MD or NP first available to be seen to determine if MRI is necessary

## 2015-08-19 NOTE — Telephone Encounter (Signed)
Rn call patient to schedule him with Dr.Ahern permission. Dr Jaynee Eagles stated she will see patient for hospital follow up, and evaluate if patient needs MRI. Pt schedule for 08-26-15 at 0230, pt told to arrive at 0215pm.

## 2015-08-26 ENCOUNTER — Ambulatory Visit (INDEPENDENT_AMBULATORY_CARE_PROVIDER_SITE_OTHER): Payer: Medicare Other | Admitting: Neurology

## 2015-08-26 ENCOUNTER — Encounter: Payer: Self-pay | Admitting: Neurology

## 2015-08-26 VITALS — BP 152/89 | HR 65 | Ht 66.0 in | Wt 128.6 lb

## 2015-08-26 DIAGNOSIS — R29818 Other symptoms and signs involving the nervous system: Secondary | ICD-10-CM | POA: Diagnosis not present

## 2015-08-26 DIAGNOSIS — I2510 Atherosclerotic heart disease of native coronary artery without angina pectoris: Secondary | ICD-10-CM

## 2015-08-26 DIAGNOSIS — R4701 Aphasia: Secondary | ICD-10-CM | POA: Diagnosis not present

## 2015-08-26 DIAGNOSIS — Z9861 Coronary angioplasty status: Secondary | ICD-10-CM | POA: Diagnosis not present

## 2015-08-26 DIAGNOSIS — M6289 Other specified disorders of muscle: Secondary | ICD-10-CM

## 2015-08-26 DIAGNOSIS — R531 Weakness: Secondary | ICD-10-CM

## 2015-08-26 NOTE — Patient Instructions (Signed)
Overall you are doing fairly well but I do want to suggest a few things today:   Remember to drink plenty of fluid, eat healthy meals and do not skip any meals. Try to eat protein with a every meal and eat a healthy snack such as fruit or nuts in between meals. Try to keep a regular sleep-wake schedule and try to exercise daily, particularly in the form of walking, 20-30 minutes a day, if you can.   As far as your medications are concerned, I would like to suggest: continue current medications  As far as diagnostic testing: MRI of the brain and an EEG  I would like to see you back in 1 months for Dr. Leonie Man, sooner if we need to. Please call us with any interim questions, concerns, problems, updates or refill requests.   Please also call us for any test results so we can go over those with you on the phone.  My clinical assistant and will answer any of your questions and relay your messages to me and also relay most of my messages to you.   Our phone number is [phone]. We also have an after hours call service for urgent matters and there is a physician on-call for urgent questions. For any emergencies you know to call 911 or go to the nearest emergency room

## 2015-08-26 NOTE — Progress Notes (Signed)
[facility]    Provider:  Dr Jaynee Eagles Referring Provider: Lona Kettle, MD Primary Care Physician:   Melinda Crutch, MD  PATIENT: Terry Kemp DOB: Mar 15, 1946  REASON FOR VISIT: routine follow up for stroke HISTORY FROM: patient  Interval history 08/26/2015: 69 year old male her for followup (Last saw Charlott Holler in 04/2014)  And is patient of Dr. Leonie Man with a PMHx of  HTN, HLD, PVD, CAD, Stroke, Anxiety, tobacco abuseand repeated episodes of ataxia and hemiparesis with confusion, agitation,dizziness. Symptoms improve over days. Appears he has had multiple of these episodes since 2012 with no etiology found. He presented to the ED on 08/16/2015. He had acute ataxia, right-sided weakness, he could not walk he had to hold the back of a wheelchair to walk even a little bit. No inciting events. Nothing made it better or worse. He was sent to the ED from the doctor's office and he was left in the waiting room for close to 3 hours. He was at home doing chores and acutely started, progressed quickly. He also had dizziness. Similar symptoms as last episodes. He was confused, agitated, he had swallowing difficulty, he was coughing. The symptoms have been slowly receding. His current symptoms include some fogginess, mind not working "100%", weakness has almost resolved, he can stand and walk not veering to the right but his right foot is dragging and getting better. Balance is better. He still has lingering symptoms. Last episode in 2015 was worse.  He has extreme anxiety. He reports decreased patience and cognitive dysfunction since stroke.    CT of the head 08/16/2015: personally reviewed imaging and agree with the following: : Several well-defined areas of low attenuation in noted in the head of the caudate nuclei bilaterally, in the right external capsule, and in the left cerebellar hemisphere, compatible with areas of remote ischemia. No acute intracranial abnormalities. Specifically,  no evidence of acute intracranial hemorrhage, no definite findings of acute/subacute cerebral ischemia, no mass, mass effect, hydrocephalus or abnormal intra or extra-axial fluid collections. Visualized paranasal sinuses and mastoids are generally well pneumatized, with exception of some multifocal mucosal thickening throughout the ethmoid sinuses, and mucosal retention cysts or polyps in the frontal sinuses bilaterally. No acute displaced skull fractures are identified.  IMPRESSION: 1. No acute intracranial abnormalities. 2. Sequela of old lacunar infarcts in the basal ganglia bilaterally, right external capsule, and left cerebellar hemisphere. 3. Paranasal sinus disease, as above.  MRI of the head 04/2014: The brain parenchyma shows remote age infarct in left corona radiata and mild changes of chronic microvascular ischemia. There are mild changes of chronic paranasal sinusitis noted.No abnormal lesions are seen on diffusion-weighted views to suggest acute ischemia. The cortical sulci, fissures and cisterns are normal in size and appearance. Lateral, third and fourth ventricle are normal in size and appearance. No extra-axial fluid collections are seen. No evidence of mass effect or midline shift. On sagittal views the posterior fossa, pituitary gland and corpus callosum are unremarkable. No evidence of intracranial hemorrhage on gradient-echo views. The orbits and their contents, paranasal sinuses and calvarium are unremarkable. Intracranial flow voids are present.     IMPRESSION: Abnormal MRI brain showing remote age left corona radiata infarct and mild changes of chronic microvascular ischemia.  HISTORY OF PRESENT ILLNESS and APPOINTMENTS WITH Charlott Holler:  Terry Kemp is a 69 year old Caucasian male who developed gait ataxia, dizziness, leaning to the left while walking, dysphagia and transient vertigo as well as right body hemi-numbness in October 2012.  He was evaluated at [facility] and I have extensive records from there which I have reviewed. MRI scan of the brain did not show any acute stroke. Carotid ultrasound was unremarkable. 2-D echo did not show cardiac source of embolism. He was continued on aspirin and Plavix which he has been taking for his cardiac stent. He was also seen by a neurologist at [facility] whose records are incomplete but as per the patient did not further contribute tto the understanding of his problem. He has moved to [facility] and is here for my opinion. He states his difficulty walking, lack of balance and dysphagia improved over several days but right hemibody and numbness has persisted though it is better. When tired he still has little bit of tingling and numbness in his hands as well as feet on the right side. He is tolerating his current medications without any side effects. He has not had any recurrent symptoms of stroke or TIAs since October last year. He has quit smoking.  Update 04/22/14 (LL): Since last visit, he had recurrent symptoms of right arm weakness, slurred speech, and confusion which lasted approximately 2 minutes, while with friends. First occurrence was 01/01/14 and the next was approximately one month later. Since these episodes lasted a short time he did not seek medical treatment. He has been living in both [place] and [place], and is building a home here which is almost complete. He will be living here now and renting his home in [place]. He states his blood pressure is well controlled although his blood pressure in the office today is 153/83. He states he has not taken his medication yet today. He restarted smoking. He had quit successfully before using Nicoderm patches. He states he has had a large activity change due to lower back pain which he plans to have surgery in November with Dr. Lorin Mercy. He is tolerating Plavix daily without significant bleeding or bruising. He still  has right-sided hemisensory deficit which he says has improved since last visit. He states since stroke in 2013 he has become increasingly anxious and agitated at times which is not his typical personality. He states he is quick tempered now and has little patience. He uses alprazolam 1 mg as needed mainly at bedtime to help him sleep. He states he has never used an SSRI or an SSRI  Review of Systems: Patient complains of symptoms per HPI as well as the following symptoms: spinning sensation, memory loss, confusion, weakness. Pertinent negatives per HPI. All others negative.   Social History   Social History  . Marital Status: Single    Spouse Name: N/A  . Number of Children: 2  . Years of Education: college   Occupational History  . Not on file.   Social History Main Topics  . Smoking status: Current Some Day Smoker -- 1.00 packs/day for 38 years    Types: Cigarettes  . Smokeless tobacco: Not on file  . Alcohol Use: 8.4 oz/week    14 Glasses of wine per week  . Drug Use: No  . Sexual Activity: Not on file   Other Topics Concern  . Not on file   Social History Narrative    Family History  Problem Relation Age of Onset  . Cancer Mother   . Heart disease Maternal Grandfather     Past Medical History  Diagnosis Date  . Hypertension   . Hyperlipidemia   . Peripheral vascular disease ([place]) 04/1998  bilateral iliac arteries PTA  and  stenting in 04/30/1998; In 2006-subsequent right common iliac stent occulsion and in stent restenosis, left common iliac  . Coronary artery disease may 2006    ST elevation MI -CATH/PCI-RCA; CATH 2008 FOR INFERIOR ISCHEMIA  . Stroke ([place]) 15    tia,  . Anxiety   . Arthritis     Past Surgical History  Procedure Laterality Date  . Doppler echocardiography  06/03/2007    EF =>55%,NORMAL LV FUNCTION  . Nm myocar perf ejection fraction  06/03/07    REST/STRESS -- EF 69% MILD INFEROAPICAL ISCHEMIA NOTED - FOLLOW UP WITH CARDIOLOGIST  .  Abdominal aotrogram  07/17/2007    ADDTION WITH CARDIAC CATH---(INFRARENAL WITH BIFEMORAL RUNOFF )----WIDELY PATENT STENTS TO LEFT AND RIGHT COMMON ILIACS  . Peripheral abd aortic cath  04/04/2005    performed by Dr Rollene Fare--- in [address],in -stent restenosis left iliac. August  2006, kissing balloon  PTA with kissing stents placed in bilateral iliac arteries ;2 overlapping 7.0 x 39 mm followed by 7.0 x 18 mm Gensis stents inth RICA, and a .0 mm Gensis stent in the LICA, noted tobe patent in 2008 cath.  . Lower extremities arterial doppler Bilateral 01/26/2005,09/28/2004    01/26/05-- right ABI -MODERATE,LEFT ABI MILLD -RIGHT CIA STENT OCCLUDED ,RIGHT IIA REVERSIBLE FLOW;LEFT CIA >70%--09/28/2004- RGT ABI'S 76, LFT ABI'S102  . Cardiac catheterization  01/10/2005    DISTAL LEFT MAIN 50% STENOSIS WITH CALCIFIED,100% MID RCA OCCLUSION WHICH  was the infaract-related vessel---- peripheral vascular obstructive disease--100% occlusion rigt common iliac stent:75% stenosis at the otflow of the left iliac,LV systolic fx,EF [id number]  . Cardiac catheterization  07/17/2007    40% distal left main stenosis, 80% stenosis RCA distally  just widely patent RCAstent that has a component of six lesion of 40% and superimosed stenoss of 80% due to spasm--widely patent stents left and left  common illiacs  . Coronary angioplasty with stent placement  01/10/2005    abdominal aortography with bifemoral runoff, PCI-cypher 3.0 x 28 mmSTENTING OF THE MID RCA  . Peripheral arteries disease  09/1999    status post bilatera iliac stents jan 2001. three stents in the left common iliasc ,1 widely patent stent right common iliac, 2 overlapping stents revealed nio more 10% in-stent restenosis in 2008.   [facility] Eye surgery Bilateral 2000    cataracts  . Spinal fusion  11/2014    SURGERY BY DR Ellene Route  . Nm myoview ltd  11/2014    Low Risk - "Diaphragmatic attenuation", no infarct or ischemia    Current Outpatient Prescriptions  Medication Sig  Dispense Refill  . ALPRAZolam (XANAX) 1 MG tablet Take 0.5 mg by mouth as needed for anxiety or sleep.     Marland Kitchen amLODipine (NORVASC) 10 MG tablet Take 1 tablet (10 mg total) by mouth daily. 90 tablet 3  . aspirin 325 MG tablet Take 1,075 mg by mouth as needed. Prn tid    . atorvastatin (LIPITOR) 40 MG tablet Take 1 tablet (40 mg total) by mouth daily. 90 tablet 3  . clopidogrel (PLAVIX) 75 MG tablet Take 75 mg by mouth daily.    . metoprolol succinate (TOPROL-XL) 25 MG 24 hr tablet Take 25 mg by mouth daily.    . Multiple Vitamin (MULTIVITAMIN) tablet Take 1 tablet by mouth daily.    . Omega-3 Fatty Acids ([employer] FISH OIL) 1000 MG CAPS Take by mouth.    . ramipril (ALTACE) 10 MG capsule Take 10 mg by mouth  daily.     . Cyanocobalamin (VITAMIN B-12 CR PO) Take by mouth daily. Reported on 08/26/2015     No current facility-administered medications for this visit.    Allergies as of 08/26/2015  . (No Known Allergies)    Vitals: BP 152/89 mmHg  Pulse 65  Ht 5\' 6"  (1.676 m)  Wt 128 lb 9.6 oz (58.333 kg)  BMI 20.77 kg/m2 Last Weight:  Wt Readings from Last 1 Encounters:  08/26/15 128 lb 9.6 oz (58.333 kg)   Last Height:   Ht Readings from Last 1 Encounters:  08/26/15 5\' 6"  (1.676 m)      Physical exam: Exam: Gen: NAD, conversant, well nourised, well groomed                     CV: RRR, no MRG. No Carotid Bruits. No peripheral edema, warm, nontender Eyes: Conjunctivae clear without exudates or hemorrhage  Neuro: Detailed Neurologic Exam  Speech:    Speech is normal; fluent and spontaneous with normal comprehension. Intact repetition, registration, attention.  Cognition:    The patient is oriented to person, place, and time;    Cranial Nerves:    The pupils are equal, round, and reactive to light. The fundi are normal and spontaneous venous pulsations are present. Visual fields are full to finger confrontation. Extraocular movements are intact. Trigeminal sensation is intact and  the muscles of mastication are normal. The face is symmetric. The palate elevates in the midline. Hearing intact. Voice is normal. Shoulder shrug is normal. The tongue has normal motion without fasciculations.   Coordination:    Normal finger to nose and heel to shin. Decreased fine motor movement of the right hand/dexterity.   Motor Observation:    No asymmetry, no atrophy, and no involuntary movements noted. Tone:    Normal muscle tone.    Posture:    Posture is normal. normal erect    Strength: Right proximal and triceps weakness but patient reports it is painful (pulled muscles, unlear if weakness is due to pain.) Right hip flexion 4/5 Right pronator drift      Sensation: right hemisensory decrease      ASSESSMENT: 69 y.o. male with repeated episodes of sudden onset of gait ataxia, dysphagia, right-sided numbness and weakness with dizziness since 2012I. Multiple vascular risk factors of hypertension, hyperlipidemia, smoking, CAD and peripheral vascular disease.  PLAN: Will repeat MRI of the brain EEG If workup unrevealing, may consider starting Depakote. Which may help with his increased agitation as well as treat seizures if no other explanation Continue ASA, Plavix and Lipitor for stroke prevention Has a follow up with Dr. Leonie Man in February   I have advised him to quit smoking.  Follow up with me in 2 months, sooner as needed. Sarina Ill, MD  [facility] [address] [place], [place] [address]  Phone [phone] Fax [phone]  A total of 45 minutes was spent face-to-face with this patient. Over half this time was spent on counseling patient on the stroke diagnosis and different diagnostic and therapeutic options available.

## 2015-09-05 HISTORY — PX: TRANSTHORACIC ECHOCARDIOGRAM: SHX275

## 2015-09-10 ENCOUNTER — Ambulatory Visit
Admission: RE | Admit: 2015-09-10 | Discharge: 2015-09-10 | Disposition: A | Discharge status: Home / Self Care | Payer: Medicare Other | Source: Ambulatory Visit | Attending: Neurology | Admitting: Neurology

## 2015-09-10 DIAGNOSIS — R29818 Other symptoms and signs involving the nervous system: Secondary | ICD-10-CM

## 2015-09-10 DIAGNOSIS — R4701 Aphasia: Secondary | ICD-10-CM

## 2015-09-10 DIAGNOSIS — R531 Weakness: Secondary | ICD-10-CM

## 2015-09-10 DIAGNOSIS — M6289 Other specified disorders of muscle: Secondary | ICD-10-CM | POA: Diagnosis not present

## 2015-09-14 ENCOUNTER — Telehealth: Payer: Self-pay | Admitting: Neurology

## 2015-09-14 NOTE — Telephone Encounter (Signed)
Spoke to patient. This is a patient of Dr. Leonie Man that I saw as a courtesy. MRI of his brain revealed a subacute small-vessel stroke in the thalamus and he wants to discuss it with a stroke doctor.  I have asked him to come in and review the MRi results with Dr. Erlinda Hong tomorrow at 10:30. He asked what he can do, and I told him again that he needs to quit smoking.    Abnormal MRI scan of the brain showing a subacute left lateral thalamic/basal ganglia infarct as well as remote age lacunar infarcts involving bilateral basal ganglia and left thalamus. There are mild changes of chronic microvascular ischemia. There are incidental changes of chronic paranasal sinusitis. Overall no significant change compared with CT scan of the head dated 08/16/2015 except the subacute left lateral thalamic/basal ganglia infarct appears to be new

## 2015-09-15 ENCOUNTER — Encounter: Payer: Self-pay | Admitting: Neurology

## 2015-09-15 ENCOUNTER — Ambulatory Visit (INDEPENDENT_AMBULATORY_CARE_PROVIDER_SITE_OTHER): Payer: Medicare Other | Admitting: Neurology

## 2015-09-15 VITALS — BP 148/89 | HR 67 | Ht 66.0 in | Wt 128.0 lb

## 2015-09-15 DIAGNOSIS — I739 Peripheral vascular disease, unspecified: Secondary | ICD-10-CM | POA: Diagnosis not present

## 2015-09-15 DIAGNOSIS — I2510 Atherosclerotic heart disease of native coronary artery without angina pectoris: Secondary | ICD-10-CM

## 2015-09-15 DIAGNOSIS — I10 Essential (primary) hypertension: Secondary | ICD-10-CM | POA: Diagnosis not present

## 2015-09-15 DIAGNOSIS — Z9861 Coronary angioplasty status: Secondary | ICD-10-CM | POA: Diagnosis not present

## 2015-09-15 DIAGNOSIS — I63312 Cerebral infarction due to thrombosis of left middle cerebral artery: Secondary | ICD-10-CM | POA: Diagnosis not present

## 2015-09-15 DIAGNOSIS — F17200 Nicotine dependence, unspecified, uncomplicated: Secondary | ICD-10-CM | POA: Insufficient documentation

## 2015-09-15 DIAGNOSIS — E785 Hyperlipidemia, unspecified: Secondary | ICD-10-CM | POA: Diagnosis not present

## 2015-09-15 DIAGNOSIS — I639 Cerebral infarction, unspecified: Secondary | ICD-10-CM | POA: Insufficient documentation

## 2015-09-15 MED ORDER — SERTRALINE HCL 50 MG PO TABS: 50.0000 mg | ORAL_TABLET | Freq: Every day | ORAL | Status: DC

## 2015-09-15 NOTE — Patient Instructions (Addendum)
-   continue plavix and lipitor for stroke prevention - will do CTA head and neck for brian vessel testing - will do echocardiogram to evaluate cardiac function. - quit smoking - prescribe zoloft for anxiety and agitation - recommend to deal with family issue to help your anxiety and agitation - check BP at home and record and bring over for the next visit. - Follow up with your primary care physician for stroke risk factor modification. Recommend maintain blood pressure goal <130/80, diabetes with hemoglobin A1c goal below 6.5% and lipids with LDL cholesterol goal below 70 mg/dL.  - follow up with Dr. Leonie Man as scheduled on 10/17/14.
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
## 2015-09-24 ENCOUNTER — Ambulatory Visit
Admission: RE | Admit: 2015-09-24 | Discharge: 2015-09-24 | Disposition: A | Discharge status: Home / Self Care | Payer: Medicare Other | Source: Ambulatory Visit | Attending: Neurology | Admitting: Neurology

## 2015-09-24 DIAGNOSIS — I63231 Cerebral infarction due to unspecified occlusion or stenosis of right carotid arteries: Secondary | ICD-10-CM | POA: Diagnosis not present

## 2015-09-24 DIAGNOSIS — I63232 Cerebral infarction due to unspecified occlusion or stenosis of left carotid arteries: Secondary | ICD-10-CM | POA: Diagnosis not present

## 2015-09-24 DIAGNOSIS — I63312 Cerebral infarction due to thrombosis of left middle cerebral artery: Secondary | ICD-10-CM

## 2015-09-24 MED ORDER — IOPAMIDOL (ISOVUE-370) INJECTION 76%
100.0000 mL | Freq: Once | INTRAVENOUS | Status: AC | PRN
  Administered 2015-09-24: 100 mL via INTRAVENOUS

## 2015-09-27 ENCOUNTER — Ambulatory Visit (INDEPENDENT_AMBULATORY_CARE_PROVIDER_SITE_OTHER): Payer: Medicare Other | Admitting: Neurology

## 2015-09-27 ENCOUNTER — Telehealth: Payer: Self-pay

## 2015-09-27 DIAGNOSIS — R4701 Aphasia: Secondary | ICD-10-CM | POA: Diagnosis not present

## 2015-09-27 DIAGNOSIS — R531 Weakness: Secondary | ICD-10-CM

## 2015-09-27 DIAGNOSIS — R29818 Other symptoms and signs involving the nervous system: Secondary | ICD-10-CM

## 2015-09-27 NOTE — Procedures (Signed)
    History:  Terry Kemp is a 70 year old gentleman with a history of episodes of ataxia and confusion. The patient recently sustained a left internal capsule infarct. The patient is being evaluated for these events.  This is a routine EEG. No skull defects are noted. Medications include Xanax, Norvasc, aspirin, Lipitor, Plavix, vitamin B12, metoprolol, multivitamins, Altace, and Zoloft.   EEG classification: Normal awake  Description of the recording: The background rhythms of this recording consists of a fairly well modulated medium amplitude alpha rhythm of 9 Hz that is reactive to eye opening and closure. As the record progresses, the patient appears to remain in the waking state throughout the recording. Photic stimulation was performed, resulting in a bilateral and symmetric photic driving response. Hyperventilation was also performed, resulting in a minimal buildup of the background rhythm activities without significant slowing seen. At no time during the recording does there appear to be evidence of spike or spike wave discharges or evidence of focal slowing. EKG monitor shows no evidence of cardiac rhythm abnormalities with a heart rate of 60.  Impression: This is a normal EEG recording in the waking state. No evidence of ictal or interictal discharges are seen.

## 2015-09-27 NOTE — Telephone Encounter (Signed)
-----   Message from Rosalin Hawking, MD sent at 09/26/2015  9:24 PM EST ----- Could you please let the patient know that the his recent CT angiogram head and neck done recently showed no large or medium vessel occlusion or high grade narrowing. Continue current medication without change. Follow up with Dr. Leonie Man as scheduled on 10/18/15.  Thanks.  Rosalin Hawking, MD PhD Stroke Neurology 09/26/2015 9:24 PM

## 2015-09-27 NOTE — Telephone Encounter (Signed)
Rn call patient to notified him that the Ct angiogram head and neck showed no large, or medium vessel occlusion or high guide narrowing.Rn stated he needs to continue treatment plan. Also to continue to follow up with Dr.Sethi on 10/18/2015. Pt verbalized understanding.

## 2015-09-28 ENCOUNTER — Telehealth: Payer: Self-pay | Admitting: *Deleted

## 2015-09-28 NOTE — Telephone Encounter (Signed)
-----   Message from Melvenia Beam, MD sent at 09/27/2015  6:32 PM EST ----- eeg normal thank

## 2015-09-28 NOTE — Telephone Encounter (Signed)
Spoke to pt about normal EEG per Dr Jaynee Eagles. Verified upcoming appt with Dr Leonie Man on 2/13 at 9am, check in 845am. He verbalized understanding.

## 2015-10-05 ENCOUNTER — Other Ambulatory Visit: Payer: Self-pay

## 2015-10-05 ENCOUNTER — Ambulatory Visit (HOSPITAL_COMMUNITY): Payer: Medicare Other | Attending: Neurology

## 2015-10-05 DIAGNOSIS — Z9861 Coronary angioplasty status: Secondary | ICD-10-CM | POA: Diagnosis not present

## 2015-10-05 DIAGNOSIS — I2510 Atherosclerotic heart disease of native coronary artery without angina pectoris: Secondary | ICD-10-CM | POA: Diagnosis not present

## 2015-10-05 DIAGNOSIS — I639 Cerebral infarction, unspecified: Secondary | ICD-10-CM | POA: Diagnosis present

## 2015-10-05 DIAGNOSIS — I10 Essential (primary) hypertension: Secondary | ICD-10-CM | POA: Diagnosis not present

## 2015-10-05 DIAGNOSIS — F17200 Nicotine dependence, unspecified, uncomplicated: Secondary | ICD-10-CM | POA: Diagnosis not present

## 2015-10-05 DIAGNOSIS — E785 Hyperlipidemia, unspecified: Secondary | ICD-10-CM | POA: Diagnosis not present

## 2015-10-07 ENCOUNTER — Telehealth: Payer: Self-pay

## 2015-10-07 NOTE — Telephone Encounter (Signed)
Rn call patient to let him know that the echocardiogram was normal.Pt verbalized understanding. Pt would like his heart md Dr. Ellyn Hack to have copy of the results

## 2015-10-07 NOTE — Telephone Encounter (Signed)
-----   Message from Rosalin Hawking, MD sent at 10/05/2015 10:39 PM EST ----- Could you please let the patient know that the echocardiogram test done recently was unremarkable. Please continue current treatment. Thanks.  Rosalin Hawking, MD PhD Stroke Neurology 10/05/2015 10:39 PM

## 2015-10-18 ENCOUNTER — Ambulatory Visit: Payer: Self-pay | Admitting: Neurology

## 2015-10-19 ENCOUNTER — Encounter: Payer: Self-pay | Admitting: Neurology

## 2015-10-20 ENCOUNTER — Ambulatory Visit (INDEPENDENT_AMBULATORY_CARE_PROVIDER_SITE_OTHER): Payer: Medicare Other | Admitting: Neurology

## 2015-10-20 ENCOUNTER — Encounter: Payer: Self-pay | Admitting: Neurology

## 2015-10-20 VITALS — BP 164/95 | HR 67 | Ht 66.0 in | Wt 128.2 lb

## 2015-10-20 DIAGNOSIS — Z9861 Coronary angioplasty status: Secondary | ICD-10-CM | POA: Diagnosis not present

## 2015-10-20 DIAGNOSIS — I2510 Atherosclerotic heart disease of native coronary artery without angina pectoris: Secondary | ICD-10-CM | POA: Diagnosis not present

## 2015-10-20 DIAGNOSIS — R29898 Other symptoms and signs involving the musculoskeletal system: Secondary | ICD-10-CM | POA: Diagnosis not present
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
## 2015-10-20 NOTE — Patient Instructions (Signed)
I had a long discussion with the patient with regards to his recent stroke, personally reviewed imaging studies and stroke evaluation results and answered questions. I recommend he stay on Plavix for stroke prevention and I strongly encourage him to quit smoking and to see his primary physician to get help for smoking cessation. Maintain strict control of hypertension with blood pressure goal below 130/90 and lipids with LDL cholesterol goal below 70 mg percent. Return for follow-up in 6 months with nurse practitioner or call earlier if necessary

## 2015-11-02 DIAGNOSIS — I69359 Hemiplegia and hemiparesis following cerebral infarction affecting unspecified side: Secondary | ICD-10-CM | POA: Diagnosis not present

## 2015-11-02 DIAGNOSIS — I10 Essential (primary) hypertension: Secondary | ICD-10-CM | POA: Diagnosis not present

## 2015-11-02 DIAGNOSIS — I739 Peripheral vascular disease, unspecified: Secondary | ICD-10-CM | POA: Diagnosis not present

## 2015-11-02 DIAGNOSIS — F419 Anxiety disorder, unspecified: Secondary | ICD-10-CM | POA: Diagnosis not present

## 2015-11-02 DIAGNOSIS — J449 Chronic obstructive pulmonary disease, unspecified: Secondary | ICD-10-CM | POA: Diagnosis not present

## 2015-11-02 DIAGNOSIS — Z125 Encounter for screening for malignant neoplasm of prostate: Secondary | ICD-10-CM | POA: Diagnosis not present

## 2015-11-02 DIAGNOSIS — Z0000 Encounter for general adult medical examination without abnormal findings: Secondary | ICD-10-CM | POA: Diagnosis not present

## 2015-11-02 DIAGNOSIS — L570 Actinic keratosis: Secondary | ICD-10-CM | POA: Diagnosis not present

## 2016-02-18 ENCOUNTER — Other Ambulatory Visit: Payer: Self-pay | Admitting: Cardiology

## 2016-02-18 NOTE — Telephone Encounter (Signed)
Rx Refill

## 2016-03-28 DIAGNOSIS — B078 Other viral warts: Secondary | ICD-10-CM | POA: Diagnosis not present

## 2016-03-28 DIAGNOSIS — C44219 Basal cell carcinoma of skin of left ear and external auricular canal: Secondary | ICD-10-CM | POA: Diagnosis not present

## 2016-03-28 DIAGNOSIS — D225 Melanocytic nevi of trunk: Secondary | ICD-10-CM | POA: Diagnosis not present

## 2016-03-28 DIAGNOSIS — C44319 Basal cell carcinoma of skin of other parts of face: Secondary | ICD-10-CM | POA: Diagnosis not present

## 2016-03-28 DIAGNOSIS — X32XXXD Exposure to sunlight, subsequent encounter: Secondary | ICD-10-CM | POA: Diagnosis not present

## 2016-03-28 DIAGNOSIS — L570 Actinic keratosis: Secondary | ICD-10-CM | POA: Diagnosis not present

## 2016-04-03 DIAGNOSIS — F419 Anxiety disorder, unspecified: Secondary | ICD-10-CM | POA: Diagnosis not present

## 2016-04-03 DIAGNOSIS — Z125 Encounter for screening for malignant neoplasm of prostate: Secondary | ICD-10-CM | POA: Diagnosis not present

## 2016-04-03 DIAGNOSIS — J449 Chronic obstructive pulmonary disease, unspecified: Secondary | ICD-10-CM | POA: Diagnosis not present

## 2016-04-03 DIAGNOSIS — M25519 Pain in unspecified shoulder: Secondary | ICD-10-CM | POA: Diagnosis not present

## 2016-04-03 DIAGNOSIS — R531 Weakness: Secondary | ICD-10-CM | POA: Diagnosis not present

## 2016-04-03 DIAGNOSIS — N529 Male erectile dysfunction, unspecified: Secondary | ICD-10-CM | POA: Diagnosis not present

## 2016-04-18 ENCOUNTER — Ambulatory Visit (INDEPENDENT_AMBULATORY_CARE_PROVIDER_SITE_OTHER): Payer: Medicare Other | Admitting: Nurse Practitioner

## 2016-04-18 ENCOUNTER — Encounter: Payer: Self-pay | Admitting: Nurse Practitioner

## 2016-04-18 VITALS — BP 134/75 | HR 65 | Ht 66.0 in | Wt 126.6 lb

## 2016-04-18 DIAGNOSIS — F17200 Nicotine dependence, unspecified, uncomplicated: Secondary | ICD-10-CM | POA: Diagnosis not present

## 2016-04-18 DIAGNOSIS — Z9861 Coronary angioplasty status: Secondary | ICD-10-CM | POA: Diagnosis not present

## 2016-04-18 DIAGNOSIS — I10 Essential (primary) hypertension: Secondary | ICD-10-CM | POA: Diagnosis not present

## 2016-04-18 DIAGNOSIS — I63312 Cerebral infarction due to thrombosis of left middle cerebral artery: Secondary | ICD-10-CM | POA: Diagnosis not present

## 2016-04-18 DIAGNOSIS — E785 Hyperlipidemia, unspecified: Secondary | ICD-10-CM

## 2016-04-18 DIAGNOSIS — G459 Transient cerebral ischemic attack, unspecified: Secondary | ICD-10-CM

## 2016-04-18 DIAGNOSIS — I2510 Atherosclerotic heart disease of native coronary artery without angina pectoris: Secondary | ICD-10-CM | POA: Diagnosis not present

## 2016-04-18 MED ORDER — SERTRALINE HCL 50 MG PO TABS: 50.0000 mg | ORAL_TABLET | Freq: Every day | ORAL | 6 refills | Status: DC

## 2016-04-18 NOTE — Patient Instructions (Signed)
ContinuePlavix for secondary stroke prevention  Maintain strict control of hypertension with blood pressure goal below 130/90  today's reading 134/75 , continue blood pressure medications Lipids with LDL cholesterol goal below 70 mg percent continue Lipitor Continue Zoloft for depression will refill Try to quit smoking Follow-up in 6 months
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
## 2016-04-25 NOTE — Progress Notes (Signed)
I agree with the above plan 

## 2016-05-01 DIAGNOSIS — M67911 Unspecified disorder of synovium and tendon, right shoulder: Secondary | ICD-10-CM | POA: Diagnosis not present

## 2016-05-16 DIAGNOSIS — D0462 Carcinoma in situ of skin of left upper limb, including shoulder: Secondary | ICD-10-CM | POA: Diagnosis not present

## 2016-05-16 DIAGNOSIS — Z85828 Personal history of other malignant neoplasm of skin: Secondary | ICD-10-CM | POA: Diagnosis not present

## 2016-05-16 DIAGNOSIS — C44619 Basal cell carcinoma of skin of left upper limb, including shoulder: Secondary | ICD-10-CM | POA: Diagnosis not present

## 2016-05-16 DIAGNOSIS — Z08 Encounter for follow-up examination after completed treatment for malignant neoplasm: Secondary | ICD-10-CM | POA: Diagnosis not present

## 2016-05-19 DIAGNOSIS — M25511 Pain in right shoulder: Secondary | ICD-10-CM | POA: Diagnosis not present

## 2016-05-22 ENCOUNTER — Encounter: Payer: Self-pay | Admitting: Cardiology

## 2016-05-22 ENCOUNTER — Ambulatory Visit (INDEPENDENT_AMBULATORY_CARE_PROVIDER_SITE_OTHER): Payer: Medicare Other | Admitting: Cardiology

## 2016-05-22 VITALS — BP 120/80 | HR 56 | Ht 66.0 in | Wt 129.6 lb

## 2016-05-22 DIAGNOSIS — I10 Essential (primary) hypertension: Secondary | ICD-10-CM

## 2016-05-22 DIAGNOSIS — E785 Hyperlipidemia, unspecified: Secondary | ICD-10-CM | POA: Diagnosis not present

## 2016-05-22 DIAGNOSIS — I2510 Atherosclerotic heart disease of native coronary artery without angina pectoris: Secondary | ICD-10-CM

## 2016-05-22 DIAGNOSIS — Z9861 Coronary angioplasty status: Secondary | ICD-10-CM

## 2016-05-22 DIAGNOSIS — I739 Peripheral vascular disease, unspecified: Secondary | ICD-10-CM | POA: Diagnosis not present

## 2016-05-22 DIAGNOSIS — I6319 Cerebral infarction due to embolism of other precerebral artery: Secondary | ICD-10-CM | POA: Diagnosis not present

## 2016-05-22 DIAGNOSIS — F17200 Nicotine dependence, unspecified, uncomplicated: Secondary | ICD-10-CM | POA: Diagnosis not present

## 2016-05-22 MED ORDER — ATORVASTATIN CALCIUM 40 MG PO TABS: 40.0000 mg | ORAL_TABLET | Freq: Every day | ORAL | 3 refills | Status: DC

## 2016-05-22 MED ORDER — AMLODIPINE BESYLATE 10 MG PO TABS
10.0000 mg | ORAL_TABLET | Freq: Every day | ORAL | 3 refills | Status: DC
Start: 2016-05-22 — End: 2017-06-18

## 2016-05-22 MED ORDER — METOPROLOL SUCCINATE ER 25 MG PO TB24: 25.0000 mg | ORAL_TABLET | Freq: Every day | ORAL | 3 refills | Status: DC

## 2016-05-22 MED ORDER — CLOPIDOGREL BISULFATE 75 MG PO TABS: 75.0000 mg | ORAL_TABLET | Freq: Every day | ORAL | 3 refills | Status: DC

## 2016-05-22 MED ORDER — RAMIPRIL 10 MG PO CAPS: 10.0000 mg | ORAL_CAPSULE | Freq: Every day | ORAL | 3 refills | Status: DC

## 2016-05-22 NOTE — Patient Instructions (Signed)
NO CHANGES WITH CURRENT MEDICATION     Your physician wants you to follow-up in 12 MONTHS WITH DR HARDING.You will receive a reminder letter in the mail two months in advance. If you don't receive a letter, please call our office to schedule the follow-up appointment.    If you need a refill on your cardiac medications before your next appointment, please call your pharmacy.  

## 2016-05-22 NOTE — Progress Notes (Signed)
PCP:  Melinda Crutch, MD  Clinic Note: Chief Complaint  Patient presents with  . Follow-up    pt c/o sob with activity  . Coronary Artery Disease   1. CAD S/P RCA DES 2006   2. Essential hypertension   3. Peripheral vascular disease ([place])   4. Hyperlipidemia with target LDL less than 70   5. Cerebrovascular accident (CVA) due to embolism of other precerebral artery ([place])   6. Current every day smoker - 38 Pk year.   7. HLD (hyperlipidemia)   8. Tobacco use disorder     HPI: Terry Kemp is a 70 y.o. male with a PMH below who presents today for 4-5 month f/u -- mostly to reassess BP. Has h/o CVA & PAD as well as HTN & HLD.  Terry Kemp was last seen in November 2016  Recent Hospitalizations:   Dec 2016 - ~ minor CVA.  Still recovering from it.  Still weak R leg & arm.  Trouble with short term memory.  Studies Reviewed:  Echo Jan 2017: Ordered by neurology. EF 60-65%. GR 1 DD. No valve lesions. Otherwise normal.  Interval History: Feeling & doing great ever since back surgery.  Slowly recovering from his CVA.  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  Despite being frustrated about his stroke and right-sided weakness, he is still staying active and doing what he would like to do. Unfortunately he still smoking, has not really had any good experiences with patches, consider continued to fall off. He tried Chantix in the past and that was unsuccessful. From a cardiology standpoint, he is actually doing fairly well. Review of symptoms as follows:  No angina CP with rest or exertion.  He does note that with significant exertion he will get short of breath, but really isn't limited from an overall activity standpoint.   No chest pain or shortness of breath with rest or exertion.  No PND, orthopnea or edema. No palpitations, lightheadedness,  dizziness, weakness or syncope/near syncope. NO Syncope/near syncope; No recurrent TIA/amaurosis fugax symptoms. No melena, hematochezia, hematuria, or epstaxis. No claudication  He is still quite angry about what he thinks was a missed stroke. He spent almost an hour in the waiting area waiting to be taken into a room for initial evaluation, then set for 30 more minutes once he was in a room.   Past Medical History:  Diagnosis Date  . Anxiety   . Arthritis   . Coronary artery disease may 2006   ST elevation MI -CATH/PCI-RCA; CATH 2008 FOR INFERIOR ISCHEMIA  . Hyperlipidemia   . Hypertension   . Peripheral vascular disease ([place]) 04/1998   bilateral iliac arteries PTA  and  stenting in 04/30/1998; In 2006-subsequent right common iliac stent occulsion and in stent restenosis, left common iliac  . Stroke [facility]) 2013; December 2016   a) Felt to be a TIA;; b) Imaging confirmed CVA - mild residual R-sided weakness   Back surgery in March 2016 - Dr. Ellene Route Past Surgical History:  Procedure Laterality Date  . ABDOMINAL AOTROGRAM  07/17/2007   ADDTION WITH CARDIAC CATH---(INFRARENAL WITH BIFEMORAL RUNOFF )----WIDELY PATENT STENTS TO LEFT AND RIGHT COMMON ILIACS  . CARDIAC CATHETERIZATION  01/10/2005   DISTAL LEFT MAIN 50% STENOSIS WITH CALCIFIED,100% MID RCA OCCLUSION WHICH  was the infaract-related vessel---- peripheral vascular obstructive disease--100% occlusion rigt common  iliac stent:75% stenosis at the otflow of the left iliac,LV systolic fx,EF [id number]  . CARDIAC CATHETERIZATION  07/17/2007   40% distal left main stenosis, 80% stenosis RCA distally  just widely patent RCAstent that has a component of six lesion of 40% and superimosed stenoss of 80% due to spasm--widely patent stents left and left  common illiacs  . CORONARY ANGIOPLASTY WITH STENT PLACEMENT  01/10/2005   abdominal aortography with bifemoral runoff, PCI-cypher 3.0 x 28 mmSTENTING OF THE MID RCA  . DOPPLER ECHOCARDIOGRAPHY   06/03/2007   EF =>55%,NORMAL LV FUNCTION  . EYE SURGERY Bilateral 2000   cataracts  . lower extremities arterial doppler Bilateral 01/26/2005,09/28/2004   01/26/05-- right ABI -MODERATE,LEFT ABI MILLD -RIGHT CIA STENT OCCLUDED ,RIGHT IIA REVERSIBLE FLOW;LEFT CIA >70%--09/28/2004- RGT ABI'S 76, LFT ABI'S102  . NM MYOCAR PERF EJECTION FRACTION  06/03/2007   REST/STRESS -- EF 69% MILD INFEROAPICAL ISCHEMIA NOTED - Referred for CATH - RCA PCI  . NM MYOVIEW LTD  11/2014   Low Risk - "Diaphragmatic attenuation", no infarct or ischemia  . peripheral abd aortic cath  04/04/2005   performed by Dr Rollene Fare--- in 200[address],in -stent restenosis left iliac. August  2006, kissing balloon  PTA with kissing stents placed in bilateral iliac arteries ;2 overlapping 7.0 x 39 mm followed by 7.0 x 18 mm Gensis stents inth RICA, and a .0 mm Gensis stent in the LICA, noted tobe patent in 2008 cath.  . peripheral arteries disease  09/1999   status post bilatera iliac stents jan 2001. three stents in the left common iliasc ,1 widely patent stent right common iliac, 2 overlapping stents revealed nio more 10% in-stent restenosis in 2008.   Marland Kitchen SPINAL FUSION  11/2014   SURGERY BY DR Ellene Route    ROS: A comprehensive was performed. Review of Systems  Constitutional: Positive for weight loss (Intentional).  Respiratory: Positive for shortness of breath (with intense exertion) and wheezing. Negative for cough (recovered from cold-URI/bronchits).        Bronchitis ~ 1 x yr.  Cardiovascular: Negative for claudication and leg swelling.  Gastrointestinal: Negative for blood in stool and melena.  Genitourinary: Negative for hematuria.  Musculoskeletal: Positive for back pain (much improved - still has some "twinges") and joint pain (R shoulder pain -- post injury, decreased ROM).  Neurological: Negative for dizziness and headaches.       Nor recurrent CVA Sx - but does have some residual right upper and lower extremity weakness.    Endo/Heme/Allergies: Does not bruise/bleed easily.  Psychiatric/Behavioral: Positive for memory loss (post CVA).  All other systems reviewed and are negative.   Prior to Admission medications   Medication Sig Start Date End Date Taking? Authorizing Provider  ALPRAZolam Duanne Moron) 1 MG tablet Take 0.5 mg by mouth as needed for anxiety or sleep.  02/16/14  Yes Historical Provider, MD  amLODipine (NORVASC) 10 MG tablet TAKE ONE TABLET BY MOUTH ONCE DAILY 02/18/16  Yes Leonie Man, MD  atorvastatin (LIPITOR) 40 MG tablet Take 1 tablet (40 mg total) by mouth daily. 06/24/15  Yes Leonie Man, MD  clopidogrel (PLAVIX) 75 MG tablet Take 75 mg by mouth daily.   Yes Historical Provider, MD  Cyanocobalamin (VITAMIN B-12 CR PO) Take by mouth daily. Reported on 08/26/2015   Yes Historical Provider, MD  metoprolol succinate (TOPROL-XL) 25 MG 24 hr tablet Take 25 mg by mouth daily.   Yes Historical Provider, MD  Multiple Vitamin (MULTIVITAMIN) tablet Take 1 tablet by mouth  daily.   Yes Historical Provider, MD  Omega-3 Fatty Acids ([employer] FISH OIL) 1000 MG CAPS Take by mouth.   Yes Historical Provider, MD  ramipril (ALTACE) 10 MG capsule Take 10 mg by mouth daily.  10/16/14  Yes Historical Provider, MD  sertraline (ZOLOFT) 50 MG tablet Take 1 tablet (50 mg total) by mouth daily. 04/18/16  Yes Dennie Bible, NP  PROAIR HFA 108 [phone] Base) MCG/ACT inhaler Inhale 1 puff into the lungs as needed. 04/03/16   Historical Provider, MD   No Known Allergies   Social History   Social History  . Marital status: Single    Spouse name: N/A  . Number of children: 2  . Years of education: college   Social History Main Topics  . Smoking status: Current Some Day Smoker    Packs/day: 1.00    Years: 38.00    Types: Cigarettes  . Smokeless tobacco: Never Used  . Alcohol use 8.4 oz/week    14 Glasses of wine per week  . Drug use: No  . Sexual activity: Not Asked   Other Topics Concern  . None   Social  History Narrative  . None   Family History  Problem Relation Age of Onset  . Cancer Mother   . Heart disease Maternal Grandfather     Wt Readings from Last 3 Encounters:  05/22/16 58.8 kg (129 lb 9.6 oz)  04/18/16 57.4 kg (126 lb 9.6 oz)  10/20/15 58.2 kg (128 lb 3.2 oz)  - back exercising.  PHYSICAL EXAM BP 120/80 (BP Location: Right Arm, Patient Position: Sitting, Cuff Size: Normal)   Pulse (!) 56   Ht 5\' 6"  (1.676 m)   Wt 58.8 kg (129 lb 9.6 oz)   BMI 20.92 kg/m  General appearance: alert, cooperative, appears stated age, no distress and otherwise healthy-appearing. Well-nourished and well-groomed. HEENT: [place]/AT, EOMI, MMM, anicteric sclera Neck: no adenopathy, no carotid bruit and no JVD Lungs: clear to auscultation bilaterally, normal percussion bilaterally and non-labored Heart: regular rate and rhythm, S1&S2 normal, no murmur, click, rub or gallop; nondisplaced PMI Abdomen: soft, non-tender; bowel sounds normal; no masses, no organomegaly;  Extremities: extremities normal, atraumatic, no cyanosis, and edema  Pulses: 2+ and symmetric;  Neurologic/Psych: Mental status: Alert, oriented, thought content appropriate, pleasant mood and affect; Cranial nerves: normal (II-XII grossly intact)   Adult ECG Report  Sinus bradycardia, rate 56. Otherwise normal axis, intervals and durations. Stable EKG.  Other studies Reviewed: Additional studies/ records that were reviewed today include:  Recent Labs:  Due for Lipids, Chem 10 soon; on statin Lab Results  Component Value Date   CHOL 173 05/21/2015   HDL 50 05/21/2015   LDLCALC 102 05/21/2015   TRIG 105 05/21/2015   CHOLHDL 3.5 05/21/2015    ASSESSMENT / PLAN: Problem List Items Addressed This Visit    Tobacco use disorder   Stroke ([place]) (Chronic)    Relatively stable overall. He remains on Plavix and statin.      Relevant Medications   amLODipine (NORVASC) 10 MG tablet   ramipril (ALTACE) 10 MG capsule    metoprolol succinate (TOPROL-XL) 25 MG 24 hr tablet   atorvastatin (LIPITOR) 40 MG tablet   Peripheral vascular disease ([facility]) (Chronic)    No active claudication symptoms. We'll need to order lower extremity/iliac arterial Dopplers      Relevant Medications   amLODipine (NORVASC) 10 MG tablet   ramipril (ALTACE) 10 MG capsule   metoprolol succinate (TOPROL-XL) 25 MG 24  hr tablet   atorvastatin (LIPITOR) 40 MG tablet   Other Relevant Orders   EKG 12-Lead (Completed)   Hyperlipidemia with target LDL less than 70 (Chronic)    He says his post have labs checked soon by his PCP. He remains on atorvastatin now, switch from Crestor. I'm. See with his follow-up labs are. Last labs from September of last year were not in range with LDL of 102.      Relevant Medications   amLODipine (NORVASC) 10 MG tablet   ramipril (ALTACE) 10 MG capsule   metoprolol succinate (TOPROL-XL) 25 MG 24 hr tablet   atorvastatin (LIPITOR) 40 MG tablet   Other Relevant Orders   EKG 12-Lead (Completed)   HLD (hyperlipidemia)   Relevant Medications   amLODipine (NORVASC) 10 MG tablet   ramipril (ALTACE) 10 MG capsule   metoprolol succinate (TOPROL-XL) 25 MG 24 hr tablet   atorvastatin (LIPITOR) 40 MG tablet   Essential hypertension (Chronic)    Well-controlled today. He is on pretty good dose of ACE inhibitor and amlodipine. Low-dose Toprol. I don't think that there would be any benefit from increasing ACE inhibitor distal blood pressure control. But right now he is stable. If we need to do anything, I would probably switch him from Toprol to carvedilol.      Relevant Medications   amLODipine (NORVASC) 10 MG tablet   ramipril (ALTACE) 10 MG capsule   metoprolol succinate (TOPROL-XL) 25 MG 24 hr tablet   atorvastatin (LIPITOR) 40 MG tablet   Other Relevant Orders   EKG 12-Lead (Completed)   Current every day smoker - 38 Pk year. (Chronic)    Now that he is had a stroke in addition to his prior TIAs and  coronary artery and peripheral artery disease, I felt like this was a good's time of the need to try to figure out a way for him to stop smoking. He says he has tried Chantix, did not do well with it. he says that her patches tend to fall off and not work very well. He has heard of one brand of patches that are better. He is willing to try those. He knows he needs to quit and is constantly making an effort. Smoking cessation instruction/counseling given:  counseled patient on the dangers of tobacco use, advised patient to stop smoking, and reviewed strategies to maximize success - 5 min.        CAD S/P RCA DES 2006 - Primary (Chronic)    Pretty much a symptomatically from a cardiac standpoint. He still has some exertional dyspnea, but that is pretty much his baseline. Negative Myoview in March 2016. Plan: Continue aspirin, current dose statin as well as beta blocker, ACE inhibitor and calcium channel blocker.      Relevant Medications   amLODipine (NORVASC) 10 MG tablet   ramipril (ALTACE) 10 MG capsule   metoprolol succinate (TOPROL-XL) 25 MG 24 hr tablet   atorvastatin (LIPITOR) 40 MG tablet   Other Relevant Orders   EKG 12-Lead (Completed)    Other Visit Diagnoses   None.     Current medicines are reviewed at length with the patient today. (+/- concerns) none The following changes have been made: none Studies Ordered:   Orders Placed This Encounter  Procedures  . EKG 12-Lead    ROV 1 yr  Glenetta Hew, M.D., M.S. Interventional Cardiologist   Pager # [phone]

## 2016-05-24 ENCOUNTER — Encounter: Payer: Self-pay | Admitting: Cardiology

## 2016-05-24 NOTE — Assessment & Plan Note (Signed)
Pretty much a symptomatically from a cardiac standpoint. He still has some exertional dyspnea, but that is pretty much his baseline. Negative Myoview in March 2016. Plan: Continue aspirin, current dose statin as well as beta blocker, ACE inhibitor and calcium channel blocker.

## 2016-05-24 NOTE — Assessment & Plan Note (Addendum)
No active claudication symptoms. We'll need to order lower extremity/iliac arterial Dopplers

## 2016-05-24 NOTE — Assessment & Plan Note (Signed)
Well-controlled today. He is on pretty good dose of ACE inhibitor and amlodipine. Low-dose Toprol. I don't think that there would be any benefit from increasing ACE inhibitor distal blood pressure control. But right now he is stable. If we need to do anything, I would probably switch him from Toprol to carvedilol.

## 2016-05-24 NOTE — Assessment & Plan Note (Signed)
He says his post have labs checked soon by his PCP. He remains on atorvastatin now, switch from Crestor. I'm. See with his follow-up labs are. Last labs from September of last year were not in range with LDL of 102.

## 2016-05-24 NOTE — Assessment & Plan Note (Signed)
Relatively stable overall. He remains on Plavix and statin.

## 2016-05-24 NOTE — Assessment & Plan Note (Signed)
Now that he is had a stroke in addition to his prior TIAs and coronary artery and peripheral artery disease, I felt like this was a good's time of the need to try to figure out a way for him to stop smoking. He says he has tried Chantix, did not do well with it. he says that her patches tend to fall off and not work very well. He has heard of one brand of patches that are better. He is willing to try those. He knows he needs to quit and is constantly making an effort. Smoking cessation instruction/counseling given:  counseled patient on the dangers of tobacco use, advised patient to stop smoking, and reviewed strategies to maximize success - 5 min.

## 2016-05-26 DIAGNOSIS — M25511 Pain in right shoulder: Secondary | ICD-10-CM | POA: Diagnosis not present

## 2016-09-16 DIAGNOSIS — J069 Acute upper respiratory infection, unspecified: Secondary | ICD-10-CM | POA: Diagnosis not present

## 2016-10-24 ENCOUNTER — Telehealth: Payer: Self-pay | Admitting: Neurology

## 2016-10-24 ENCOUNTER — Encounter: Payer: Self-pay | Admitting: Nurse Practitioner

## 2016-10-24 NOTE — Telephone Encounter (Signed)
Appointment Request From: Terry Kemp    With Provider: Melvenia Beam, MD [[facility]]    Preferred Date Range: From 10/24/2016 To 10/25/2016    Preferred Times: Any    Reason: To address the following health maintenance concerns.  Hepatitis C Screening  Tetanus/Tdap  Colonoscopy  Pna Vac Low Risk Adult  Influenza Vaccine    Comments:

## 2016-11-03 DIAGNOSIS — J449 Chronic obstructive pulmonary disease, unspecified: Secondary | ICD-10-CM | POA: Diagnosis not present

## 2016-11-03 DIAGNOSIS — R0602 Shortness of breath: Secondary | ICD-10-CM | POA: Diagnosis not present

## 2016-11-03 DIAGNOSIS — F419 Anxiety disorder, unspecified: Secondary | ICD-10-CM | POA: Diagnosis not present

## 2016-12-06 DIAGNOSIS — Z0001 Encounter for general adult medical examination with abnormal findings: Secondary | ICD-10-CM | POA: Diagnosis not present

## 2016-12-06 DIAGNOSIS — F419 Anxiety disorder, unspecified: Secondary | ICD-10-CM | POA: Diagnosis not present

## 2016-12-06 DIAGNOSIS — I10 Essential (primary) hypertension: Secondary | ICD-10-CM | POA: Diagnosis not present

## 2016-12-06 DIAGNOSIS — I69359 Hemiplegia and hemiparesis following cerebral infarction affecting unspecified side: Secondary | ICD-10-CM | POA: Diagnosis not present

## 2016-12-06 DIAGNOSIS — Z125 Encounter for screening for malignant neoplasm of prostate: Secondary | ICD-10-CM | POA: Diagnosis not present

## 2016-12-06 DIAGNOSIS — I679 Cerebrovascular disease, unspecified: Secondary | ICD-10-CM | POA: Diagnosis not present

## 2016-12-06 DIAGNOSIS — I2510 Atherosclerotic heart disease of native coronary artery without angina pectoris: Secondary | ICD-10-CM | POA: Diagnosis not present

## 2016-12-06 DIAGNOSIS — Z23 Encounter for immunization: Secondary | ICD-10-CM | POA: Diagnosis not present

## 2017-01-03 DIAGNOSIS — M4317 Spondylolisthesis, lumbosacral region: Secondary | ICD-10-CM | POA: Diagnosis not present

## 2017-01-15 DIAGNOSIS — D126 Benign neoplasm of colon, unspecified: Secondary | ICD-10-CM | POA: Diagnosis not present

## 2017-01-15 DIAGNOSIS — K5730 Diverticulosis of large intestine without perforation or abscess without bleeding: Secondary | ICD-10-CM | POA: Diagnosis not present

## 2017-01-15 DIAGNOSIS — K640 First degree hemorrhoids: Secondary | ICD-10-CM | POA: Diagnosis not present

## 2017-01-15 DIAGNOSIS — K635 Polyp of colon: Secondary | ICD-10-CM | POA: Diagnosis not present

## 2017-01-15 DIAGNOSIS — Z1211 Encounter for screening for malignant neoplasm of colon: Secondary | ICD-10-CM | POA: Diagnosis not present

## 2017-01-16 ENCOUNTER — Telehealth: Payer: Self-pay | Admitting: Neurology

## 2017-01-16 NOTE — Telephone Encounter (Signed)
Pt quit smoking about 2 months ago, since  strokes  Pt has had feelings of depression and anxiousness, he said his sleeping pills are not working.  Pt agreed to make an appointment with Dr. He said he does not feel he is able to make wise choices since stroke. If not home please leave message and he will call back.

## 2017-01-16 NOTE — Telephone Encounter (Signed)
Spoke to pt and he states he is needing something for his depression.  He has seen his pcp, Dr. Harrington Challenger and has been on wellbutrin which made him nuts, then zoloft.  Does not feel like it is working well.  Is taking hydroxyzine from [facility], Dr. Harrington Challenger.  Had colonoscopy yesterday.  He feels isolated.  Has son, and ex wife.  Does not have plan to hurt himself.  He will contact Dr. Harrington Challenger.  I called him back to offer appt with Dr. Leonie Man 02-07-17 and he stated can't wait that long and will see Dr. Harrington Challenger for referral to psychiatry.  He stopped smoking 2 months ago.  States uses xanax for anxiety and sleep, but wakes up and then cannot go back to sleep.  Stated 2 glasses of wine helped him and does not want to get used to this.  He appreciated call back. I will cancel the appt made.

## 2017-01-18 DIAGNOSIS — Z1211 Encounter for screening for malignant neoplasm of colon: Secondary | ICD-10-CM | POA: Diagnosis not present

## 2017-01-18 DIAGNOSIS — K635 Polyp of colon: Secondary | ICD-10-CM | POA: Diagnosis not present

## 2017-01-18 DIAGNOSIS — D126 Benign neoplasm of colon, unspecified: Secondary | ICD-10-CM | POA: Diagnosis not present

## 2017-02-07 ENCOUNTER — Ambulatory Visit: Payer: Medicare Other | Admitting: Neurology

## 2017-02-08 DIAGNOSIS — F39 Unspecified mood [affective] disorder: Secondary | ICD-10-CM | POA: Diagnosis not present

## 2017-03-20 DIAGNOSIS — F39 Unspecified mood [affective] disorder: Secondary | ICD-10-CM | POA: Diagnosis not present
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
## 2017-04-03 ENCOUNTER — Ambulatory Visit (INDEPENDENT_AMBULATORY_CARE_PROVIDER_SITE_OTHER): Payer: Medicare Other | Admitting: Nurse Practitioner

## 2017-04-03 ENCOUNTER — Encounter: Payer: Self-pay | Admitting: Nurse Practitioner

## 2017-04-03 VITALS — BP 131/83 | HR 72 | Wt 138.0 lb

## 2017-04-03 DIAGNOSIS — Z8673 Personal history of transient ischemic attack (TIA), and cerebral infarction without residual deficits: Secondary | ICD-10-CM | POA: Insufficient documentation

## 2017-04-03 DIAGNOSIS — E785 Hyperlipidemia, unspecified: Secondary | ICD-10-CM | POA: Diagnosis not present

## 2017-04-03 DIAGNOSIS — I10 Essential (primary) hypertension: Secondary | ICD-10-CM

## 2017-04-03 NOTE — Progress Notes (Signed)
I agree with the above plan 

## 2017-04-03 NOTE — Patient Instructions (Addendum)
Management of stroke risk factors Continue Plavix for secondary stroke prevention  Maintain strict control of hypertension with blood pressure goal below 130/90  today's reading 131/83, continue blood pressure medications Lipids with LDL cholesterol goal below 70 mg percent continue Lipitor Continue Abilify  for depression  Congratulations on quitting smoking  Continue  to exercise on a daily basis, healthy diet Discharge from stroke clinic  Stroke Prevention Some medical conditions and behaviors are associated with an increased chance of having a stroke. You may prevent a stroke by making healthy choices and managing medical conditions. How can I reduce my risk of having a stroke?  Stay physically active. Get at least 30 minutes of activity on most or all days.  Do not smoke. It may also be helpful to avoid exposure to secondhand smoke.  Limit alcohol use. Moderate alcohol use is considered to be: ? No more than 2 drinks per day for men. ? No more than 1 drink per day for nonpregnant women.  Eat healthy foods. This involves: ? Eating 5 or more servings of fruits and vegetables a day. ? Making dietary changes that address high blood pressure (hypertension), high cholesterol, diabetes, or obesity.  Manage your cholesterol levels. ? Making food choices that are high in fiber and low in saturated fat, trans fat, and cholesterol may control cholesterol levels. ? Take any prescribed medicines to control cholesterol as directed by your health care provider.  Manage your diabetes. ? Controlling your carbohydrate and sugar intake is recommended to manage diabetes. ? Take any prescribed medicines to control diabetes as directed by your health care provider.  Control your hypertension. ? Making food choices that are low in salt (sodium), saturated fat, trans fat, and cholesterol is recommended to manage hypertension. ? Ask your health care provider if you need treatment to lower your blood  pressure. Take any prescribed medicines to control hypertension as directed by your health care provider. ? If you are 48-32 years of age, have your blood pressure checked every 3-5 years. If you are 56 years of age or older, have your blood pressure checked every year.  Maintain a healthy weight. ? Reducing calorie intake and making food choices that are low in sodium, saturated fat, trans fat, and cholesterol are recommended to manage weight.  Stop drug abuse.  Avoid taking birth control pills. ? Talk to your health care provider about the risks of taking birth control pills if you are over 32 years old, smoke, get migraines, or have ever had a blood clot.  Get evaluated for sleep disorders (sleep apnea). ? Talk to your health care provider about getting a sleep evaluation if you snore a lot or have excessive sleepiness.  Take medicines only as directed by your health care provider. ? For some people, aspirin or blood thinners (anticoagulants) are helpful in reducing the risk of forming abnormal blood clots that can lead to stroke. If you have the irregular heart rhythm of atrial fibrillation, you should be on a blood thinner unless there is a good reason you cannot take them. ? Understand all your medicine instructions.  Make sure that other conditions (such as anemia or atherosclerosis) are addressed. Get help right away if:  You have sudden weakness or numbness of the face, arm, or leg, especially on one side of the body.  Your face or eyelid droops to one side.  You have sudden confusion.  You have trouble speaking (aphasia) or understanding.  You have sudden trouble seeing in  one or both eyes.  You have sudden trouble walking.  You have dizziness.  You have a loss of balance or coordination.  You have a sudden, severe headache with no known cause.  You have new chest pain or an irregular heartbeat. Any of these symptoms may represent a serious problem that is an  emergency. Do not wait to see if the symptoms will go away. Get medical help at once. Call your local emergency services (911 in [place] Do not drive yourself to the hospital. This information is not intended to replace advice given to you by your health care provider. Make sure you discuss any questions you have with your health care provider. Document Released: 09/28/2004 Document Revised: 01/27/2016 Document Reviewed: 02/21/2013 Elsevier Interactive Patient Education  2017 [employer].

## 2017-04-04 DIAGNOSIS — F39 Unspecified mood [affective] disorder: Secondary | ICD-10-CM | POA: Diagnosis not present

## 2017-04-18 ENCOUNTER — Ambulatory Visit: Payer: Medicare Other | Admitting: Nurse Practitioner

## 2017-05-21 DIAGNOSIS — Z23 Encounter for immunization: Secondary | ICD-10-CM | POA: Diagnosis not present

## 2017-05-21 DIAGNOSIS — F411 Generalized anxiety disorder: Secondary | ICD-10-CM | POA: Diagnosis not present

## 2017-05-21 DIAGNOSIS — G4700 Insomnia, unspecified: Secondary | ICD-10-CM | POA: Diagnosis not present

## 2017-06-17 ENCOUNTER — Other Ambulatory Visit: Payer: Self-pay | Admitting: Cardiology

## 2017-06-18 ENCOUNTER — Ambulatory Visit (INDEPENDENT_AMBULATORY_CARE_PROVIDER_SITE_OTHER): Payer: Medicare Other | Admitting: Physician Assistant

## 2017-06-18 ENCOUNTER — Encounter: Payer: Self-pay | Admitting: Physician Assistant

## 2017-06-18 VITALS — BP 130/72 | HR 56 | Ht 66.0 in | Wt 141.0 lb

## 2017-06-18 DIAGNOSIS — E785 Hyperlipidemia, unspecified: Secondary | ICD-10-CM

## 2017-06-18 DIAGNOSIS — I2510 Atherosclerotic heart disease of native coronary artery without angina pectoris: Secondary | ICD-10-CM

## 2017-06-18 DIAGNOSIS — F329 Major depressive disorder, single episode, unspecified: Secondary | ICD-10-CM

## 2017-06-18 DIAGNOSIS — Z8673 Personal history of transient ischemic attack (TIA), and cerebral infarction without residual deficits: Secondary | ICD-10-CM

## 2017-06-18 DIAGNOSIS — F32A Depression, unspecified: Secondary | ICD-10-CM

## 2017-06-18 DIAGNOSIS — I10 Essential (primary) hypertension: Secondary | ICD-10-CM | POA: Diagnosis not present

## 2017-06-18 DIAGNOSIS — I739 Peripheral vascular disease, unspecified: Secondary | ICD-10-CM | POA: Diagnosis not present

## 2017-06-18 MED ORDER — ATORVASTATIN CALCIUM 40 MG PO TABS: 40.0000 mg | ORAL_TABLET | Freq: Every day | ORAL | 3 refills | Status: DC

## 2017-06-18 MED ORDER — METOPROLOL SUCCINATE ER 25 MG PO TB24: 25.0000 mg | ORAL_TABLET | Freq: Every day | ORAL | 3 refills | Status: DC

## 2017-06-18 MED ORDER — CLOPIDOGREL BISULFATE 75 MG PO TABS: 75.0000 mg | ORAL_TABLET | Freq: Every day | ORAL | 3 refills | Status: DC

## 2017-06-18 MED ORDER — AMLODIPINE BESYLATE 10 MG PO TABS: 10.0000 mg | ORAL_TABLET | Freq: Every day | ORAL | 3 refills | Status: DC

## 2017-06-18 MED ORDER — RAMIPRIL 10 MG PO CAPS: 10.0000 mg | ORAL_CAPSULE | Freq: Every day | ORAL | 3 refills | Status: DC

## 2017-06-18 NOTE — Patient Instructions (Signed)
Medication Instructions:   No changes - continue current regimen  Labwork:   None needed - we will request labwork records from your primary care office  Testing/Procedures:  none  Follow-Up:  1 year with Dr. Ellyn Hack  If you need a refill on your cardiac medications before your next appointment, please call your pharmacy.

## 2017-06-18 NOTE — Progress Notes (Signed)
Cardiology Office Note    Date:  06/18/2017   ID:  Terry Kemp, DOB 10/11/1945, MRN [id number]  PCP:  Terry Cruel, MD  Cardiologist:  Dr. Ellyn Kemp  Chief Complaint  Patient presents with  . Follow-up    no chest pain, fatigue frequently     History of Present Illness:  Terry Kemp is a 71 y.o. male with PMH of CAD, HTN, PAD, HLD, CVA and h/o tobacco abuse. He had a cardiac catheterization in May 2006 and had a 3.0 x 28 mm Cypher stent placed in LAD, his right coronary artery was 100% occluded in mid vessel. He eventually had 3.0 x 28 mm Cypher stent placed in totally occluded RCA as well. Last cardiac catheterization in 2008 showed 30-40% narrowing in the distal left main, 80% stenosis in distal RCA, EF 63%. He had widely patent stents in the left and right common iliacs. He had a minor CVA in December 2016 resulting in weekend right upper and lower extremity. He also had multiple prior TIAs. Myoview in March 2016 was negative. Last echocardiogram in January 2017 showed EF 60-65%, grade 1 DD, no significant valvular issue. She was last seen by Dr. Ellyn Kemp on 05/22/2016, she was doing well at the time still recovering from his CEA.  [name] from the last office visit. He denies any chest pain or significant shortness of breath. He did quit smoking 6 months ago, I congratulated him on this. In the past 6 months, he also had a nerve breakdown and severe depression and has been followed by psychiatry service. He is getting some family support from his son who sees him once a week and also his ex-wife. He goes to [place] [facility] for exercise. We will request cholesterol lab work from his PCP.    Past Medical History:  Diagnosis Date  . Anxiety   . Arthritis   . Coronary artery disease may 2006   ST elevation MI -CATH/PCI-RCA; CATH 2008 FOR INFERIOR ISCHEMIA  . Hyperlipidemia   . Hypertension   . Peripheral vascular disease ([place]) 04/1998   bilateral iliac arteries PTA  and   stenting in 04/30/1998; In 2006-subsequent right common iliac stent occulsion and in stent restenosis, left common iliac  . Stroke [facility]) 2013; December 2016   a) Felt to be a TIA;; b) Imaging confirmed CVA - mild residual R-sided weakness    Past Surgical History:  Procedure Laterality Date  . ABDOMINAL AOTROGRAM  07/17/2007   ADDTION WITH CARDIAC CATH---(INFRARENAL WITH BIFEMORAL RUNOFF )----WIDELY PATENT STENTS TO LEFT AND RIGHT COMMON ILIACS  . CARDIAC CATHETERIZATION  01/10/2005   DISTAL LEFT MAIN 50% STENOSIS WITH CALCIFIED,100% MID RCA OCCLUSION WHICH  was the infaract-related vessel---- peripheral vascular obstructive disease--100% occlusion rigt common iliac stent:75% stenosis at the otflow of the left iliac,LV systolic fx,EF 96%  . CARDIAC CATHETERIZATION  07/17/2007   40% distal left main stenosis, 80% stenosis RCA distally  just widely patent RCAstent that has a component of six lesion of 40% and superimosed stenoss of 80% due to spasm--widely patent stents left and left  common illiacs  . CORONARY ANGIOPLASTY WITH STENT PLACEMENT  01/10/2005   abdominal aortography with bifemoral runoff, PCI-cypher 3.0 x 28 mmSTENTING OF THE MID RCA  . DOPPLER ECHOCARDIOGRAPHY  06/03/2007   EF =>55%,NORMAL LV FUNCTION  . EYE SURGERY Bilateral 2000   cataracts  . lower extremities arterial doppler Bilateral 01/26/2005,09/28/2004   01/26/05-- right ABI -MODERATE,LEFT ABI MILLD -RIGHT CIA STENT  OCCLUDED ,RIGHT IIA REVERSIBLE FLOW;LEFT CIA >70%--09/28/2004- RGT ABI'S 76, LFT ABI'S102  . NM MYOCAR PERF EJECTION FRACTION  06/03/2007   REST/STRESS -- EF 69% MILD INFEROAPICAL ISCHEMIA NOTED - Referred for CATH - RCA PCI  . NM MYOVIEW LTD  11/2014   Low Risk - "Diaphragmatic attenuation", no infarct or ischemia  . peripheral abd aortic cath  04/04/2005   performed by Dr [facility] Kemp--- in 200[address],in -stent restenosis left iliac. August  2006, kissing balloon  PTA with kissing stents placed in bilateral iliac  arteries ;2 overlapping 7.0 x 39 mm followed by 7.0 x 18 mm Gensis stents inth RICA, and a .0 mm Gensis stent in the LICA, noted tobe patent in 2008 cath.  . peripheral arteries disease  09/1999   status post bilatera iliac stents jan 2001. three stents in the left common iliasc ,1 widely patent stent right common iliac, 2 overlapping stents revealed nio more 10% in-stent restenosis in 2008.   Marland Kitchen SPINAL FUSION  11/2014   SURGERY BY DR Terry Kemp    Current Medications: Outpatient Medications Prior to Visit  Medication Sig Dispense Refill  . ALPRAZolam (XANAX) 1 MG tablet Take 0.5 mg by mouth as needed for anxiety or sleep.     . ARIPiprazole (ABILIFY) 5 MG tablet Take 5 mg by mouth daily.    . Cyanocobalamin (VITAMIN B-12 CR PO) Take by mouth daily. Reported on 08/26/2015    . Multiple Vitamin (MULTIVITAMIN) tablet Take 1 tablet by mouth daily.    . Omega-3 Fatty Acids ([employer] FISH OIL) 1000 MG CAPS Take by mouth.    Marland Kitchen PROAIR HFA 108 (90 Base) MCG/ACT inhaler Inhale 1 puff into the lungs as needed.    Marland Kitchen amLODipine (NORVASC) 10 MG tablet Take 1 tablet (10 mg total) by mouth daily. 90 tablet 3  . atorvastatin (LIPITOR) 40 MG tablet Take 1 tablet (40 mg total) by mouth daily. 90 tablet 3  . clopidogrel (PLAVIX) 75 MG tablet Take 1 tablet (75 mg total) by mouth daily. 90 tablet 3  . metoprolol succinate (TOPROL-XL) 25 MG 24 hr tablet Take 1 tablet (25 mg total) by mouth daily. 90 tablet 3  . ramipril (ALTACE) 10 MG capsule Take 1 capsule (10 mg total) by mouth daily. 90 capsule 3  . sertraline (ZOLOFT) 50 MG tablet Take 1 tablet (50 mg total) by mouth daily. (Patient not taking: Reported on 04/03/2017) 30 tablet 6   No facility-administered medications prior to visit.      Allergies:   Patient has no known allergies.   Social History   Social History  . Marital status: Single    Spouse name: N/A  . Number of children: 2  . Years of education: college   Social History Main Topics  . Smoking  status: Former Smoker    Packs/day: 1.00    Years: 38.00    Types: Cigarettes    Quit date: 01/01/2017  . Smokeless tobacco: Never Used  . Alcohol use 8.4 oz/week    14 Glasses of wine per week  . Drug use: No  . Sexual activity: Not Asked   Other Topics Concern  . None   Social History Narrative  . None     Family History:  The patient's family history includes Cancer in his mother; Heart disease in his maternal grandfather.   ROS:   Please see the history of present illness.    ROS All other systems reviewed and are negative.   PHYSICAL EXAM:  VS:  BP 130/72   Pulse (!) 56   Ht 5\' 6"  (1.676 m)   Wt 141 lb (64 kg)   BMI 22.76 kg/m    GEN: Well nourished, well developed, in no acute distress  HEENT: normal  Neck: no JVD, carotid bruits, or masses Cardiac: RRR; no murmurs, rubs, or gallops,no edema  Respiratory:  clear to auscultation bilaterally, normal work of breathing GI: soft, nontender, nondistended, + BS MS: no deformity or atrophy  Skin: warm and dry, no rash Neuro:  Alert and Oriented x 3, Strength and sensation are intact Psych: euthymic mood, full affect  Wt Readings from Last 3 Encounters:  06/18/17 141 lb (64 kg)  04/03/17 138 lb (62.6 kg)  05/22/16 129 lb 9.6 oz (58.8 kg)      Studies/Labs Reviewed:   EKG:  EKG is ordered today.  The ekg ordered today demonstrates Sinus bradycardia, heart heart rate 56, otherwise no significant ST-T wave changes.  Recent Labs: No results found for requested labs within last 8760 hours.   Lipid Panel    Component Value Date/Time   CHOL 173 05/21/2015 1209   TRIG 105 05/21/2015 1209   HDL 50 05/21/2015 1209   CHOLHDL 3.5 05/21/2015 1209   VLDL 21 05/21/2015 1209   LDLCALC 102 05/21/2015 1209    Additional studies/ records that were reviewed today include:   Cath 07/17/2007     Echo 10/05/2015 LV EF: 60% -   65%  Study Conclusions  - Left ventricle: The cavity size was normal. Wall thickness  was   normal. Systolic function was normal. The estimated ejection   fraction was in the range of 60% to 65%. Wall motion was normal;   there were no regional wall motion abnormalities. Doppler   parameters are consistent with abnormal left ventricular   relaxation (grade 1 diastolic dysfunction). - Aortic valve: There was no stenosis. - Mitral valve: There was no significant regurgitation. - Right ventricle: The cavity size was normal. Systolic function   was normal. - Pulmonary arteries: No complete TR doppler jet so unable to   estimate PA systolic pressure. - Inferior vena cava: The vessel was normal in size. The   respirophasic diameter changes were in the normal range (= 50%),   consistent with normal central venous pressure.  Impressions:  - Normal LV size and systolic function, EF 53-66%. Normal RV size   and systolic function. No significant valvular abnormalities.    ASSESSMENT:    1. Coronary artery disease involving native coronary artery of native heart without angina pectoris   2. Essential hypertension   3. PAD (peripheral artery disease) ([place])   4. Hyperlipidemia, unspecified hyperlipidemia type   5. H/O: CVA (cerebrovascular accident)   6. Depression, unspecified depression type      PLAN:  In order of problems listed above:  1. CAD: Denies any chest pain or shortness of breath.  2. Severe depression: This is likely his primary issue at this point, also contributing to fatigue. He says he is not interested in getting to know people anymore. It seems his ex-wife and the his son has been trying to help him. He is also being followed by psychiatry service.  3. PAD: Denies any leg pain with exertion  4. Hypertension: Blood pressure well-controlled  5. Hyperlipidemia: On Lipitor 40 mg daily. We'll need to request lipid panel from PCPs office.  6. H/o CVA: Continue to have balance issues     Medication Adjustments/Labs and Tests Ordered:  Current  medicines are reviewed at length with the patient today.  Concerns regarding medicines are outlined above.  Medication changes, Labs and Tests ordered today are listed in the Patient Instructions below. Patient Instructions  Medication Instructions:   No changes - continue current regimen  Labwork:   None needed - we will request labwork records from your primary care office  Testing/Procedures:  none  Follow-Up:  1 year with Dr. Ellyn Kemp  If you need a refill on your cardiac medications before your next appointment, please call your pharmacy.      Hilbert Corrigan, [place]  06/18/2017 8:26 PM    [address], [place], [place]  [address] Phone: [phone]; Fax: [phone]

## 2017-06-22 MED ORDER — METOPROLOL SUCCINATE ER 25 MG PO TB24: 25.0000 mg | ORAL_TABLET | Freq: Every day | ORAL | 3 refills | Status: AC

## 2017-09-27 DIAGNOSIS — F331 Major depressive disorder, recurrent, moderate: Secondary | ICD-10-CM | POA: Diagnosis not present

## 2017-09-28 DIAGNOSIS — F331 Major depressive disorder, recurrent, moderate: Secondary | ICD-10-CM | POA: Diagnosis not present

## 2017-10-17 DIAGNOSIS — F331 Major depressive disorder, recurrent, moderate: Secondary | ICD-10-CM | POA: Diagnosis not present

## 2017-10-31 DIAGNOSIS — F331 Major depressive disorder, recurrent, moderate: Secondary | ICD-10-CM | POA: Diagnosis not present

## 2017-11-06 DIAGNOSIS — J069 Acute upper respiratory infection, unspecified: Secondary | ICD-10-CM | POA: Diagnosis not present

## 2017-11-06 DIAGNOSIS — R509 Fever, unspecified: Secondary | ICD-10-CM | POA: Diagnosis not present

## 2017-11-19 DIAGNOSIS — F411 Generalized anxiety disorder: Secondary | ICD-10-CM | POA: Diagnosis not present

## 2017-11-19 DIAGNOSIS — F324 Major depressive disorder, single episode, in partial remission: Secondary | ICD-10-CM | POA: Diagnosis not present

## 2017-11-19 DIAGNOSIS — G4700 Insomnia, unspecified: Secondary | ICD-10-CM | POA: Diagnosis not present

## 2017-12-14 DIAGNOSIS — Z1283 Encounter for screening for malignant neoplasm of skin: Secondary | ICD-10-CM | POA: Diagnosis not present

## 2017-12-14 DIAGNOSIS — L820 Inflamed seborrheic keratosis: Secondary | ICD-10-CM | POA: Diagnosis not present

## 2017-12-14 DIAGNOSIS — X32XXXD Exposure to sunlight, subsequent encounter: Secondary | ICD-10-CM | POA: Diagnosis not present

## 2017-12-14 DIAGNOSIS — Z85828 Personal history of other malignant neoplasm of skin: Secondary | ICD-10-CM | POA: Diagnosis not present

## 2017-12-14 DIAGNOSIS — L570 Actinic keratosis: Secondary | ICD-10-CM | POA: Diagnosis not present

## 2017-12-14 DIAGNOSIS — Z08 Encounter for follow-up examination after completed treatment for malignant neoplasm: Secondary | ICD-10-CM | POA: Diagnosis not present

## 2018-01-09 DIAGNOSIS — F411 Generalized anxiety disorder: Secondary | ICD-10-CM | POA: Diagnosis not present

## 2018-01-09 DIAGNOSIS — J449 Chronic obstructive pulmonary disease, unspecified: Secondary | ICD-10-CM | POA: Diagnosis not present

## 2018-01-09 DIAGNOSIS — Z0000 Encounter for general adult medical examination without abnormal findings: Secondary | ICD-10-CM | POA: Diagnosis not present

## 2018-01-09 DIAGNOSIS — I69359 Hemiplegia and hemiparesis following cerebral infarction affecting unspecified side: Secondary | ICD-10-CM | POA: Diagnosis not present

## 2018-01-09 DIAGNOSIS — I10 Essential (primary) hypertension: Secondary | ICD-10-CM | POA: Diagnosis not present

## 2018-01-09 DIAGNOSIS — E7800 Pure hypercholesterolemia, unspecified: Secondary | ICD-10-CM | POA: Diagnosis not present

## 2018-01-09 DIAGNOSIS — I739 Peripheral vascular disease, unspecified: Secondary | ICD-10-CM | POA: Diagnosis not present

## 2018-01-09 DIAGNOSIS — F324 Major depressive disorder, single episode, in partial remission: Secondary | ICD-10-CM | POA: Diagnosis not present

## 2018-02-25 ENCOUNTER — Telehealth (HOSPITAL_COMMUNITY): Payer: Self-pay | Admitting: Professional

## 2018-03-04 ENCOUNTER — Other Ambulatory Visit (HOSPITAL_COMMUNITY): Payer: Medicare Other | Attending: Psychiatry | Admitting: Licensed Clinical Social Worker

## 2018-03-04 DIAGNOSIS — I10 Essential (primary) hypertension: Secondary | ICD-10-CM | POA: Diagnosis not present

## 2018-03-04 DIAGNOSIS — F321 Major depressive disorder, single episode, moderate: Secondary | ICD-10-CM | POA: Diagnosis not present

## 2018-03-06 NOTE — Psych (Signed)
Comprehensive Clinical Assessment (CCA) Note  03/06/2018 [facility] [id number]  Visit Diagnosis:      ICD-10-CM   1. Major depressive disorder, single episode, moderate (HCC) F32.1       CCA Part One  Part One has been completed on paper by the patient.  (See scanned document in Chart Review)  CCA Part Two A  Intake/Chief Complaint:  CCA Intake With Chief Complaint CCA Part Two Date: 03/04/18 CCA Part Two Time: 27 Chief Complaint/Presenting Problem: Pt reports to PHP for CCA. Pt reports he has never experienced depression until recently and contributes it to taking Chantix to stop smoking 6 months ago. Pt reports he has always been an active and hard working person, even after 2 strokes in the last 3 years. Pt shares that recently he has been unable to take care of himself so he moved out of his home and into his ex-wife's home. Pt reports it has been stressful so far because ex-wife does not want him to have any girlfriends while living with her. Pt shares he has seen a doctor at [facility] and believes they over-medicated him. Pt is currently only taking Xanax at night to help him sleep. Pt reports he drinks 2 glasses of wine each night. Cln discussed dangers or drinking and using Xanax with pt. Pt reports understanding. Cln encouraged pt to f/u with doctor about this. Pt reports current symptoms include anhedonia, racing thoughts, concentration issues, and lethargy. Pt states he has no supports and "I don't know what to do anymore." Pt also states "Everyone is getting tired of me. I don't know what I'm going to do," "I'm frazled, I'm tired, and I'm lost," and "I want me back." Pt endorses passive SI a few weeks ago, but denies current SI/HI/AVH.  Patients Currently Reported Symptoms/Problems: depression; anxiety; anhedonia; lack of motivation; racing thoughts; low energy; excessive worry; poor concentration Individual's Strengths: Pt motivated for tx  Mental  Health Symptoms Depression:  Depression: Change in energy/activity, Difficulty Concentrating, Fatigue, Irritability, Hopelessness  Mania:     Anxiety:      Psychosis:     Trauma:     Obsessions:     Compulsions:     Inattention:     Hyperactivity/Impulsivity:     Oppositional/Defiant Behaviors:     Borderline Personality:     Other Mood/Personality Symptoms:      Mental Status Exam Appearance and self-care  Stature:  Stature: Average  Weight:  Weight: Average weight  Clothing:  Clothing: Casual  Grooming:  Grooming: Normal  Cosmetic use:  Cosmetic Use: None  Posture/gait:  Posture/Gait: Normal  Motor activity:  Motor Activity: Not Remarkable  Sensorium  Attention:  Attention: Normal  Concentration:  Concentration: Normal  Orientation:  Orientation: X5  Recall/memory:  Recall/Memory: Normal  Affect and Mood  Affect:  Affect: Depressed  Mood:  Mood: Depressed  Relating  Eye contact:  Eye Contact: Avoided  Facial expression:  Facial Expression: Depressed  Attitude toward examiner:  Attitude Toward Examiner: Cooperative  Thought and Language  Speech flow: Speech Flow: Normal  Thought content:  Thought Content: Appropriate to mood and circumstances  Preoccupation:     Hallucinations:     Organization:     Transport planner of Knowledge:  Fund of Knowledge: Average  Intelligence:  Intelligence: Average  Abstraction:  Abstraction: Normal  Judgement:  Judgement: Fair  Art therapist:  Reality Testing: Adequate  Insight:  Insight: Fair  Decision Making:  Decision  Making: Paralyzed  Social Functioning  Social Maturity:  Social Maturity: Responsible  Social Judgement:  Social Judgement: Normal  Stress  Stressors:  Stressors: Grief/losses, Family conflict, Illness, Transitions  Coping Ability:  Coping Ability: Deficient supports  Skill Deficits:     Supports:      Family and Psychosocial History: Family history Marital status: Divorced Divorced, when?:  2011 What types of issues is patient dealing with in the relationship?: Pt recently moved in with ex-wife for care. Pt reports the situation causes tension because ex does not want pt to have girlfriends while she is caring for him. Additional relationship information: Pt was married to ex for 32 years Does patient have children?: Yes How many children?: 2 How is patient's relationship with their children?: 1 son from previous marriage (lasted 1 year in college). Pt reports he talks to this son about 1x a year. 1 son from 32 yr marriage. This son lives on pt's property with wife and grandchild.  Childhood History:  Childhood History Additional childhood history information: Pt reports he was adopted; parents are deceased Does patient have siblings?: No Did patient suffer any verbal/emotional/physical/sexual abuse as a child?: No Did patient suffer from severe childhood neglect?: No Has patient ever been sexually abused/assaulted/raped as an adolescent or adult?: No Was the patient ever a victim of a crime or a disaster?: No Witnessed domestic violence?: No Has patient been effected by domestic violence as an adult?: No  CCA Part Two B  Employment/Work Situation: Employment / Work Copywriter, advertising Employment situation: Retired Did Regulatory affairs officer Any Psychiatric Treatment/Services While in Passenger transport manager?: No Are There Guns or Chiropractor in [place]?: Yes Types of Guns/Weapons: "guns" Are These Psychologist, educational?: Yes("in a safe")  Education:    Religion: Religion/Spirituality Are You A Religious Person?: ("some")  Leisure/Recreation: Leisure / Recreation Leisure and Hobbies: TV  Exercise/Diet: Exercise/Diet Do You Exercise?: Yes What Type of Exercise Do You Do?: Run/Walk How Many Times a Week Do You Exercise?: 1-3 times a week Have You Gained or Lost A Significant Amount of Weight in the Past Six Months?: No Do You Follow a Special Diet?: No Do You Have Any Trouble  Sleeping?: Yes Explanation of Sleeping Difficulties: Pt reports trouble falling asleep when not using Xanax due to racing thoughts  CCA Part Two C  Alcohol/Drug Use: Alcohol / Drug Use Prescriptions: Xanax 1mg  prn qhs History of alcohol / drug use?: Yes Substance #1 Name of Substance 1: Alcohol 1 - Age of First Use: 20 1 - Amount (size/oz): 2 glasses of wine 1 - Frequency: nightly 1 - Duration: "years. It used to be 2 beers but I switched to wine" 1 - Last Use / Amount: last night/ 2 glasses of wine Substance #2 Name of Substance 2: Nicotine 2 - Age of First Use: 20 2 - Amount (size/oz): a pack a day 2 - Frequency: daily 2 - Duration: 35 years 2 - Last Use / Amount: Today- using nicotine tabs to quit smoking  CCA Part Three  ASAM's:  Six Dimensions of Multidimensional Assessment  Dimension 1:  Acute Intoxication and/or Withdrawal Potential:     Dimension 2:  Biomedical Conditions and Complications:     Dimension 3:  Emotional, Behavioral, or Cognitive Conditions and Complications:     Dimension 4:  Readiness to Change:     Dimension 5:  Relapse, Continued use, or Continued Problem Potential:     Dimension 6:  Recovery/Living Environment:      Substance  use Disorder (SUD)    Social Function:  Social Functioning Social Maturity: Responsible Social Judgement: Normal  Stress:  Stress Stressors: Grief/losses, Family conflict, Illness, Transitions Coping Ability: Deficient supports Priority Risk: Moderate Risk  Risk Assessment- Self-Harm Potential: Risk Assessment For Self-Harm Potential Thoughts of Self-Harm: No current thoughts Method: No plan Additional Comments for Self-Harm Potential: Pt reports some passive SI in the past few weeks but denies currently.  Risk Assessment -Dangerous to Others Potential: Risk Assessment For Dangerous to Others Potential Method: No Plan  DSM5 Diagnoses: Patient Active Problem List   Diagnosis Date Noted  . History of stroke  04/03/2017  . Stroke ([place]) 09/15/2015  . HLD (hyperlipidemia) 09/15/2015  . PVD (peripheral vascular disease) ([place]) 09/15/2015  . Tobacco use disorder 09/15/2015  . Essential hypertension 12/23/2014  . Current every day smoker - 38 Pk year. 12/23/2014  . Spondylolisthesis at L5-S1 level 11/09/2014  . Hyperlipidemia with target LDL less than 70 11/04/2014  . TIA (transient ischemic attack) 04/22/2014  . Depression with anxiety 04/22/2014  . CAD S/P RCA DES 2006 04/16/2014  . Peripheral vascular disease ([place]) 04/16/2014  . 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 Depression is new for this pt and pt is seeking guidance.)  Treatment Plan Summary:  Pt states, "I don't have any supports" and "I want me back."  Referrals to Alternative Service(s): Referred to Alternative Service(s):   Place:   Date:   Time:    Referred to Alternative Service(s):   Place:   Date:   Time:    Referred to Alternative Service(s):   Place:   Date:   Time:    Referred to Alternative Service(s):   Place:   Date:   Time:     Aydeen Blume J Letzy Gullickson, LPCA, LCASA

## 2018-03-11 ENCOUNTER — Other Ambulatory Visit (HOSPITAL_COMMUNITY): Payer: Medicare Other | Admitting: Psychiatry

## 2018-03-12 ENCOUNTER — Other Ambulatory Visit (HOSPITAL_COMMUNITY): Payer: Medicare Other

## 2018-03-13 ENCOUNTER — Other Ambulatory Visit (HOSPITAL_COMMUNITY): Payer: Medicare Other

## 2018-03-14 ENCOUNTER — Other Ambulatory Visit (HOSPITAL_COMMUNITY): Payer: Medicare Other

## 2018-03-15 ENCOUNTER — Other Ambulatory Visit (HOSPITAL_COMMUNITY): Payer: Medicare Other

## 2018-03-18 ENCOUNTER — Other Ambulatory Visit (HOSPITAL_COMMUNITY): Payer: Medicare Other

## 2018-03-19 ENCOUNTER — Other Ambulatory Visit (HOSPITAL_COMMUNITY): Payer: Medicare Other

## 2018-03-20 ENCOUNTER — Other Ambulatory Visit (HOSPITAL_COMMUNITY): Payer: Medicare Other

## 2018-03-21 ENCOUNTER — Other Ambulatory Visit (HOSPITAL_COMMUNITY): Payer: Medicare Other

## 2018-03-22 ENCOUNTER — Other Ambulatory Visit (HOSPITAL_COMMUNITY): Payer: Medicare Other

## 2018-03-25 ENCOUNTER — Other Ambulatory Visit (HOSPITAL_COMMUNITY): Payer: Medicare Other

## 2018-03-26 ENCOUNTER — Other Ambulatory Visit (HOSPITAL_COMMUNITY): Payer: Medicare Other

## 2018-03-27 ENCOUNTER — Other Ambulatory Visit (HOSPITAL_COMMUNITY): Payer: Medicare Other

## 2018-03-28 ENCOUNTER — Other Ambulatory Visit (HOSPITAL_COMMUNITY): Payer: Medicare Other

## 2018-03-29 ENCOUNTER — Other Ambulatory Visit (HOSPITAL_COMMUNITY): Payer: Medicare Other

## 2018-04-01 ENCOUNTER — Other Ambulatory Visit (HOSPITAL_COMMUNITY): Payer: Medicare Other

## 2018-04-02 ENCOUNTER — Other Ambulatory Visit (HOSPITAL_COMMUNITY): Payer: Medicare Other

## 2018-04-03 ENCOUNTER — Other Ambulatory Visit (HOSPITAL_COMMUNITY): Payer: Medicare Other

## 2018-04-04 ENCOUNTER — Other Ambulatory Visit (HOSPITAL_COMMUNITY): Payer: Medicare Other

## 2018-04-05 ENCOUNTER — Other Ambulatory Visit (HOSPITAL_COMMUNITY): Payer: Medicare Other

## 2018-04-08 ENCOUNTER — Other Ambulatory Visit (HOSPITAL_COMMUNITY): Payer: Medicare Other

## 2018-04-09 ENCOUNTER — Other Ambulatory Visit (HOSPITAL_COMMUNITY): Payer: Medicare Other

## 2018-04-10 ENCOUNTER — Other Ambulatory Visit (HOSPITAL_COMMUNITY): Payer: Medicare Other

## 2018-04-11 ENCOUNTER — Other Ambulatory Visit (HOSPITAL_COMMUNITY): Payer: Medicare Other

## 2018-04-11 ENCOUNTER — Emergency Department (HOSPITAL_COMMUNITY)
Admission: EM | Admit: 2018-04-11 | Discharge: 2018-04-12 | Disposition: A | Discharge status: Home / Self Care | Payer: Medicare Other | Attending: Emergency Medicine | Admitting: Emergency Medicine

## 2018-04-11 ENCOUNTER — Encounter (HOSPITAL_COMMUNITY): Payer: Self-pay | Admitting: *Deleted

## 2018-04-11 ENCOUNTER — Other Ambulatory Visit: Payer: Self-pay

## 2018-04-11 DIAGNOSIS — R404 Transient alteration of awareness: Secondary | ICD-10-CM | POA: Diagnosis not present

## 2018-04-11 DIAGNOSIS — Y999 Unspecified external cause status: Secondary | ICD-10-CM | POA: Diagnosis not present

## 2018-04-11 DIAGNOSIS — Y939 Activity, unspecified: Secondary | ICD-10-CM | POA: Insufficient documentation

## 2018-04-11 DIAGNOSIS — F419 Anxiety disorder, unspecified: Secondary | ICD-10-CM

## 2018-04-11 DIAGNOSIS — Y929 Unspecified place or not applicable: Secondary | ICD-10-CM | POA: Diagnosis not present

## 2018-04-11 DIAGNOSIS — I252 Old myocardial infarction: Secondary | ICD-10-CM | POA: Diagnosis not present

## 2018-04-11 DIAGNOSIS — Z79899 Other long term (current) drug therapy: Secondary | ICD-10-CM | POA: Diagnosis not present

## 2018-04-11 DIAGNOSIS — X58XXXA Exposure to other specified factors, initial encounter: Secondary | ICD-10-CM | POA: Insufficient documentation

## 2018-04-11 DIAGNOSIS — T1491XA Suicide attempt, initial encounter: Secondary | ICD-10-CM

## 2018-04-11 DIAGNOSIS — R0902 Hypoxemia: Secondary | ICD-10-CM | POA: Diagnosis not present

## 2018-04-11 DIAGNOSIS — Z87891 Personal history of nicotine dependence: Secondary | ICD-10-CM | POA: Diagnosis not present

## 2018-04-11 DIAGNOSIS — I739 Peripheral vascular disease, unspecified: Secondary | ICD-10-CM | POA: Insufficient documentation

## 2018-04-11 DIAGNOSIS — T424X2A Poisoning by benzodiazepines, intentional self-harm, initial encounter: Secondary | ICD-10-CM | POA: Diagnosis not present

## 2018-04-11 DIAGNOSIS — R4182 Altered mental status, unspecified: Secondary | ICD-10-CM | POA: Diagnosis not present

## 2018-04-11 DIAGNOSIS — I10 Essential (primary) hypertension: Secondary | ICD-10-CM | POA: Diagnosis not present

## 2018-04-11 DIAGNOSIS — F29 Unspecified psychosis not due to a substance or known physiological condition: Secondary | ICD-10-CM | POA: Diagnosis not present

## 2018-04-11 DIAGNOSIS — F329 Major depressive disorder, single episode, unspecified: Secondary | ICD-10-CM

## 2018-04-11 DIAGNOSIS — F32A Depression, unspecified: Secondary | ICD-10-CM

## 2018-04-11 HISTORY — DX: Major depressive disorder, single episode, unspecified: F32.9

## 2018-04-11 HISTORY — DX: Depression, unspecified: F32.A

## 2018-04-11 LAB — COMPREHENSIVE METABOLIC PANEL
ALK PHOS: 83 U/L (ref 38–126)
ALT: 22 U/L (ref 0–44)
AST: 23 U/L (ref 15–41)
Albumin: 3.9 g/dL (ref 3.5–5.0)
Anion gap: 11 (ref 5–15)
BUN: 11 mg/dL (ref 8–23)
CALCIUM: 9.3 mg/dL (ref 8.9–10.3)
CO2: 26 mmol/L (ref 22–32)
CREATININE: 0.78 mg/dL (ref 0.61–1.24)
Chloride: 101 mmol/L (ref 98–111)
Glucose, Bld: 89 mg/dL (ref 70–99)
Potassium: 4.2 mmol/L (ref 3.5–5.1)
Sodium: 138 mmol/L (ref 135–145)
Total Bilirubin: 0.8 mg/dL (ref 0.3–1.2)
Total Protein: 6.9 g/dL (ref 6.5–8.1)

## 2018-04-11 LAB — SALICYLATE LEVEL

## 2018-04-11 LAB — RAPID URINE DRUG SCREEN, HOSP PERFORMED
Amphetamines: NOT DETECTED
BARBITURATES: NOT DETECTED
Benzodiazepines: POSITIVE — AB
Cocaine: NOT DETECTED
Opiates: NOT DETECTED
Tetrahydrocannabinol: NOT DETECTED

## 2018-04-11 LAB — CBC WITH DIFFERENTIAL/PLATELET
BASOS ABS: 0 10*3/uL (ref 0.0–0.1)
BASOS PCT: 0 %
EOS PCT: 4 %
Eosinophils Absolute: 0.3 10*3/uL (ref 0.0–0.7)
HCT: 46.8 % (ref 39.0–52.0)
Hemoglobin: 16.6 g/dL (ref 13.0–17.0)
LYMPHS PCT: 22 %
Lymphs Abs: 1.4 10*3/uL (ref 0.7–4.0)
MCH: 31.3 pg (ref 26.0–34.0)
MCHC: 35.5 g/dL (ref 30.0–36.0)
MCV: 88.1 fL (ref 78.0–100.0)
Monocytes Absolute: 0.8 10*3/uL (ref 0.1–1.0)
Monocytes Relative: 12 %
Neutro Abs: 4 10*3/uL (ref 1.7–7.7)
Neutrophils Relative %: 62 %
PLATELETS: 187 10*3/uL (ref 150–400)
RBC: 5.31 MIL/uL (ref 4.22–5.81)
RDW: 11.9 % (ref 11.5–15.5)
WBC: 6.5 10*3/uL (ref 4.0–10.5)

## 2018-04-11 LAB — ETHANOL: Alcohol, Ethyl (B): <10 mg/dL (ref <10)

## 2018-04-11 LAB — CBG MONITORING, ED: Glucose-Capillary: 80 mg/dL (ref 70–99)

## 2018-04-11 LAB — ACETAMINOPHEN LEVEL

## 2018-04-11 MED ORDER — AMLODIPINE BESYLATE 5 MG PO TABS
10.0000 mg | ORAL_TABLET | Freq: Every day | ORAL | Status: DC
  Administered 2018-04-11 – 2018-04-12 (×2): 10 mg via ORAL
  Filled 2018-04-11 (×2): qty 2

## 2018-04-11 MED ORDER — ARIPIPRAZOLE 5 MG PO TABS
5.0000 mg | ORAL_TABLET | Freq: Every day | ORAL | Status: DC
  Administered 2018-04-11 – 2018-04-12 (×2): 5 mg via ORAL
  Filled 2018-04-11 (×2): qty 1

## 2018-04-11 MED ORDER — RAMIPRIL 10 MG PO CAPS
10.0000 mg | ORAL_CAPSULE | Freq: Every day | ORAL | Status: DC
  Administered 2018-04-11 – 2018-04-12 (×2): 10 mg via ORAL
  Filled 2018-04-11 (×2): qty 1

## 2018-04-11 MED ORDER — ALBUTEROL SULFATE HFA 108 (90 BASE) MCG/ACT IN AERS
1.0000 | INHALATION_SPRAY | Freq: Four times a day (QID) | RESPIRATORY_TRACT | Status: DC | PRN
Start: 2018-04-11 — End: 2018-04-12

## 2018-04-11 MED ORDER — ATORVASTATIN CALCIUM 40 MG PO TABS
40.0000 mg | ORAL_TABLET | Freq: Every day | ORAL | Status: DC
  Administered 2018-04-11 – 2018-04-12 (×2): 40 mg via ORAL
  Filled 2018-04-11 (×2): qty 1

## 2018-04-11 MED ORDER — SODIUM CHLORIDE 0.9 % IV SOLN
INTRAVENOUS | Status: DC
  Administered 2018-04-11: 22:00:00 via INTRAVENOUS

## 2018-04-11 MED ORDER — CLOPIDOGREL BISULFATE 75 MG PO TABS
75.0000 mg | ORAL_TABLET | Freq: Every day | ORAL | Status: DC
  Administered 2018-04-11 – 2018-04-12 (×2): 75 mg via ORAL
  Filled 2018-04-11 (×2): qty 1

## 2018-04-11 MED ORDER — METOPROLOL SUCCINATE ER 25 MG PO TB24
25.0000 mg | ORAL_TABLET | Freq: Every day | ORAL | Status: DC
  Administered 2018-04-11 – 2018-04-12 (×2): 25 mg via ORAL
  Filled 2018-04-11 (×2): qty 1

## 2018-04-11 NOTE — BH Assessment (Addendum)
Tele Assessment Note   Patient Name: Terry Kemp MRN: [id number] Referring Physician: Dorie Rank, MD Location of Patient: [facility] Location of Provider: [place] [facility]  Terry Kemp is an 72 y.o. male who presents to the ED voluntarily due to an intentional OD of 8 xanax combined with alcohol. Pt states to this writer that he was "just trying to get some rest" however he reported to the EDP that he was attempting suicide. Pt identifies his recent stressors as having to sell his house and move in with his ex-wife. Pt states he has not been sleeping and has been having really bad dreams causing him to lose sleep.   Pt present calm and cooperative. Pt is alert and oriented x3 (person, place, time). TTS asked the pt if he has a POA and he stated "yes, Frasier and Patriciaann Clan." TTS asked the pt if he has ever attempted suicide in the past and pt stated "well everyone thinks about it." Pt denies HI and denies AVH.  Pt endorses symptoms of depression including hopelessness, isolation, sleep changes, and loss of motivation.   TTS consulted with Patriciaann Clan, PA who recommends inpt treatment. EDP Dorie Rank, MD has been advised. Pt's nurse Rosezella Rumpf T, RN has also been notified of the disposition.  Diagnosis: Major depressive disorder, Recurrent episode, Severe; Adjustment disorder, With depressed mood  Past Medical History:  Past Medical History:  Diagnosis Date  . Anxiety   . Arthritis   . Coronary artery disease may 2006   ST elevation MI -CATH/PCI-RCA; CATH 2008 FOR INFERIOR ISCHEMIA  . Depression   . Hyperlipidemia   . Hypertension   . Peripheral vascular disease ([place]) 04/1998   bilateral iliac arteries PTA  and  stenting in 04/30/1998; In 2006-subsequent right common iliac stent occulsion and in stent restenosis, left common iliac  . Stroke [facility]) 2013; December 2016   a) Felt to be a TIA;; b) Imaging confirmed CVA - mild residual R-sided weakness    Past  Surgical History:  Procedure Laterality Date  . ABDOMINAL AOTROGRAM  07/17/2007   ADDTION WITH CARDIAC CATH---(INFRARENAL WITH BIFEMORAL RUNOFF )----WIDELY PATENT STENTS TO LEFT AND RIGHT COMMON ILIACS  . CARDIAC CATHETERIZATION  01/10/2005   DISTAL LEFT MAIN 50% STENOSIS WITH CALCIFIED,100% MID RCA OCCLUSION WHICH  was the infaract-related vessel---- peripheral vascular obstructive disease--100% occlusion rigt common iliac stent:75% stenosis at the otflow of the left iliac,LV systolic fx,EF 67%  . CARDIAC CATHETERIZATION  07/17/2007   40% distal left main stenosis, 80% stenosis RCA distally  just widely patent RCAstent that has a component of six lesion of 40% and superimosed stenoss of 80% due to spasm--widely patent stents left and left  common illiacs  . CORONARY ANGIOPLASTY WITH STENT PLACEMENT  01/10/2005   abdominal aortography with bifemoral runoff, PCI-cypher 3.0 x 28 mmSTENTING OF THE MID RCA  . DOPPLER ECHOCARDIOGRAPHY  06/03/2007   EF =>55%,NORMAL LV FUNCTION  . EYE SURGERY Bilateral 2000   cataracts  . lower extremities arterial doppler Bilateral 01/26/2005,09/28/2004   01/26/05-- right ABI -MODERATE,LEFT ABI MILLD -RIGHT CIA STENT OCCLUDED ,RIGHT IIA REVERSIBLE FLOW;LEFT CIA >70%--09/28/2004- RGT ABI'S 76, LFT ABI'S102  . NM MYOCAR PERF EJECTION FRACTION  06/03/2007   REST/STRESS -- EF 69% MILD INFEROAPICAL ISCHEMIA NOTED - Referred for CATH - RCA PCI  . NM MYOVIEW LTD  11/2014   Low Risk - "Diaphragmatic attenuation", no infarct or ischemia  . peripheral abd aortic cath  04/04/2005   performed by  Dr Rollene Fare--- in [address],in -stent restenosis left iliac. August  2006, kissing balloon  PTA with kissing stents placed in bilateral iliac arteries ;2 overlapping 7.0 x 39 mm followed by 7.0 x 18 mm Gensis stents inth RICA, and a .0 mm Gensis stent in the LICA, noted tobe patent in 2008 cath.  . peripheral arteries disease  09/1999   status post bilatera iliac stents jan 2001. three stents  in the left common iliasc ,1 widely patent stent right common iliac, 2 overlapping stents revealed nio more 10% in-stent restenosis in 2008.   Marland Kitchen SPINAL FUSION  11/2014   SURGERY BY DR Ellene Route    Family History:  Family History  Problem Relation Age of Onset  . Cancer Mother   . Heart disease Maternal Grandfather     Social History:  reports that he quit smoking about 15 months ago. His smoking use included cigarettes. He has a 38.00 pack-year smoking history. He has never used smokeless tobacco. He reports that he drinks about 14.0 standard drinks of alcohol per week. He reports that he does not use drugs.  Additional Social History:  Alcohol / Drug Use Pain Medications: See MAR Prescriptions: See MAR Over the Counter: See MAR History of alcohol / drug use?: Yes Substance #1 Name of Substance 1: Alcohol 1 - Age of First Use: unknown 1 - Amount (size/oz): pt reports to EDP he "had 2 shots" 1 - Frequency: unknown 1 - Duration: ongoing 1 - Last Use / Amount: 04/11/18  CIWA: CIWA-Ar BP: (!) 156/64 Pulse Rate: 71 COWS:    Allergies: No Known Allergies  Home Medications:  (Not in a hospital admission)  OB/GYN Status:  No LMP for male patient.  General Assessment Data Location of Assessment: [facility] TTS Assessment: In system Is this a Tele or Face-to-Face Assessment?: Tele Assessment Is this an Initial Assessment or a Re-assessment for this encounter?: Initial Assessment Marital status: Divorced Is patient pregnant?: No Pregnancy Status: No Living Arrangements: Non-relatives/Friends Can pt return to current living arrangement?: Yes Admission Status: Voluntary Is patient capable of signing voluntary admission?: Yes Referral Source: Self/Family/Friend Insurance type: [facility]     Crisis Care Plan Living Arrangements: Non-relatives/Friends Name of Psychiatrist: pt reports a med management provider but does not recall the name  Name of Therapist: pt denies  Education  Status Is patient currently in school?: No Is the patient employed, unemployed or receiving disability?: Unemployed  Risk to self with the past 6 months Suicidal Ideation: Yes-Currently Present Has patient been a risk to self within the past 6 months prior to admission? : Yes Suicidal Intent: Yes-Currently Present Has patient had any suicidal intent within the past 6 months prior to admission? : Yes Is patient at risk for suicide?: Yes Suicidal Plan?: Yes-Currently Present Has patient had any suicidal plan within the past 6 months prior to admission? : Yes Specify Current Suicidal Plan: pt reportedly ingested 8 xanax and alcohol in a suicide attempt  Access to Means: Yes Specify Access to Suicidal Means: pt has access to medication What has been your use of drugs/alcohol within the last 12 months?: reports to some alcohol use  Previous Attempts/Gestures: Yes How many times?: 1 Triggers for Past Attempts: Unknown Intentional Self Injurious Behavior: None Family Suicide History: No Recent stressful life event(s): Loss (Comment), Other (Comment)(sold house ) Persecutory voices/beliefs?: No Depression: Yes Depression Symptoms: Despondent, Loss of interest in usual pleasures, Feeling worthless/self pity, Isolating Substance abuse history and/or treatment for substance abuse?: No Suicide  prevention information given to non-admitted patients: Not applicable  Risk to Others within the past 6 months Homicidal Ideation: No Does patient have any lifetime risk of violence toward others beyond the six months prior to admission? : No Thoughts of Harm to Others: No Current Homicidal Intent: No Current Homicidal Plan: No Access to Homicidal Means: No History of harm to others?: No Assessment of Violence: None Noted Does patient have access to weapons?: No Criminal Charges Pending?: No Does patient have a court date: No Is patient on probation?: No  Psychosis Hallucinations: None  noted Delusions: None noted  Mental Status Report Appearance/Hygiene: Unremarkable, In scrubs Eye Contact: Good Motor Activity: Freedom of movement Speech: Logical/coherent Level of Consciousness: Alert Mood: Depressed, Worthless, low self-esteem Affect: Blunted, Depressed Anxiety Level: None Thought Processes: Flight of Ideas Judgement: Impaired Orientation: Person, Place, Time Obsessive Compulsive Thoughts/Behaviors: None  Cognitive Functioning Concentration: Fair Memory: Remote Intact, Recent Intact Is patient IDD: No Is patient DD?: No Insight: Poor Impulse Control: Poor Appetite: Fair Have you had any weight changes? : No Change Sleep: Decreased Total Hours of Sleep: 4 Vegetative Symptoms: None    ADLScreening [facility] Assessment Services) Patient's cognitive ability adequate to safely complete daily activities?: Yes Patient able to express need for assistance with ADLs?: Yes Independently performs ADLs?: Yes (appropriate for developmental age)  Prior Inpatient Therapy Prior Inpatient Therapy: No  Prior Outpatient Therapy Prior Outpatient Therapy: Yes Prior Therapy Dates: current Prior Therapy Facilty/Provider(s): pt does not recall name of provider  Reason for Treatment: med management  Does patient have an ACCT team?: No Does patient have Intensive In-House Services?  : No Does patient have [facility] services? : No Does patient have P4CC services?: No  ADL Screening (condition at time of admission) Patient's cognitive ability adequate to safely complete daily activities?: Yes Is the patient deaf or have difficulty hearing?: No Does the patient have difficulty seeing, even when wearing glasses/contacts?: No Does the patient have difficulty concentrating, remembering, or making decisions?: Yes Patient able to express need for assistance with ADLs?: Yes Does the patient have difficulty dressing or bathing?: Yes(pt had stroke, some pain on one side per reports from  ED staff ) Independently performs ADLs?: Yes (appropriate for developmental age) Does the patient have difficulty walking or climbing stairs?: Yes Weakness of Legs: Both Weakness of Arms/Hands: Both  Home Assistive Devices/Equipment Home Assistive Devices/Equipment: Cane (specify quad or straight)    Abuse/Neglect Assessment (Assessment to be complete while patient is alone) Abuse/Neglect Assessment Can Be Completed: Yes Physical Abuse: Denies Verbal Abuse: Denies Sexual Abuse: Denies Exploitation of patient/patient's resources: Denies Self-Neglect: Denies     Regulatory affairs officer (For Healthcare) Does Patient Have a Medical Advance Directive?: No Would patient like information on creating a medical advance directive?: No - Patient declined    Additional Information 1:1 In Past 12 Months?: No CIRT Risk: No Elopement Risk: No Does patient have medical clearance?: Yes     Disposition: TTS consulted with Patriciaann Clan, PA who recommends inpt treatment. EDP Dorie Rank, MD has been advised. Pt's nurse Rosezella Rumpf T, RN has also been notified of the disposition.  Disposition Initial Assessment Completed for this Encounter: Yes Disposition of Patient: Admit Type of inpatient treatment program: Adult(per Patriciaann Clan, PA) Patient refused recommended treatment: No  This service was provided via telemedicine using a 2-way, interactive audio and video technology.  Names of all persons participating in this telemedicine service and their role in this encounter. Name: Terry Kemp Role:  Patient  Name: Lind Covert Role: TTS          Lyanne Co 04/12/2018 12:07 AM

## 2018-04-11 NOTE — ED Provider Notes (Signed)
[facility] DEPT Provider Note   CSN: [id number] Arrival date & time: 04/11/18  1808     History   Chief Complaint Chief Complaint  Patient presents with  . Suicide Attempt    HPI Terry Kemp is a 72 y.o. male.  HPI Pt states he tried to commit suicide.  He drank two shots and took 8 xanax.   Pt is upset because he sold his house and he doesn't have any friends.  He is living in his ex wifes house.  Pt has been seeing a counselor but it is not helping.  He is not taking his medications. Past Medical History:  Diagnosis Date  . Anxiety   . Arthritis   . Coronary artery disease may 2006   ST elevation MI -CATH/PCI-RCA; CATH 2008 FOR INFERIOR ISCHEMIA  . Depression   . Hyperlipidemia   . Hypertension   . Peripheral vascular disease ([place]) 04/1998   bilateral iliac arteries PTA  and  stenting in 04/30/1998; In 2006-subsequent right common iliac stent occulsion and in stent restenosis, left common iliac  . Stroke [facility]) 2013; December 2016   a) Felt to be a TIA;; b) Imaging confirmed CVA - mild residual R-sided weakness    Patient Active Problem List   Diagnosis Date Noted  . History of stroke 04/03/2017  . Stroke ([place]) 09/15/2015  . HLD (hyperlipidemia) 09/15/2015  . PVD (peripheral vascular disease) ([place]) 09/15/2015  . Tobacco use disorder 09/15/2015  . Essential hypertension 12/23/2014  . Current every day smoker - 38 Pk year. 12/23/2014  . Spondylolisthesis at L5-S1 level 11/09/2014  . Hyperlipidemia with target LDL less than 70 11/04/2014  . TIA (transient ischemic attack) 04/22/2014  . Depression with anxiety 04/22/2014  . CAD S/P RCA DES 2006 04/16/2014  . Peripheral vascular disease ([place]) 04/16/2014  . 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 ABDOMINAL AOTROGRAM  07/17/2007   ADDTION WITH CARDIAC CATH---(INFRARENAL WITH BIFEMORAL RUNOFF )----WIDELY PATENT STENTS TO LEFT  AND RIGHT COMMON ILIACS  . CARDIAC CATHETERIZATION  01/10/2005   DISTAL LEFT MAIN 50% STENOSIS WITH CALCIFIED,100% MID RCA OCCLUSION WHICH  was the infaract-related vessel---- peripheral vascular obstructive disease--100% occlusion rigt common iliac stent:75% stenosis at the otflow of the left iliac,LV systolic fx,EF 53%  . CARDIAC CATHETERIZATION  07/17/2007   40% distal left main stenosis, 80% stenosis RCA distally  just widely patent RCAstent that has a component of six lesion of 40% and superimosed stenoss of 80% due to spasm--widely patent stents left and left  common illiacs  . CORONARY ANGIOPLASTY WITH STENT PLACEMENT  01/10/2005   abdominal aortography with bifemoral runoff, PCI-cypher 3.0 x 28 mmSTENTING OF THE MID RCA  . DOPPLER ECHOCARDIOGRAPHY  06/03/2007   EF =>55%,NORMAL LV FUNCTION  . EYE SURGERY Bilateral 2000   cataracts  . lower extremities arterial doppler Bilateral 01/26/2005,09/28/2004   01/26/05-- right ABI -MODERATE,LEFT ABI MILLD -RIGHT CIA STENT OCCLUDED ,RIGHT IIA REVERSIBLE FLOW;LEFT CIA >70%--09/28/2004- RGT ABI'S 76, LFT ABI'S102  . NM MYOCAR PERF EJECTION FRACTION  06/03/2007   REST/STRESS -- EF 69% MILD INFEROAPICAL ISCHEMIA NOTED - Referred for CATH - RCA PCI  . NM MYOVIEW LTD  11/2014   Low Risk - "Diaphragmatic attenuation", no infarct or ischemia  . peripheral abd aortic cath  04/04/2005   performed by Dr Rollene Fare--- in [address],in -stent restenosis left iliac. August  2006, kissing balloon  PTA with kissing stents placed  in bilateral iliac arteries ;2 overlapping 7.0 x 39 mm followed by 7.0 x 18 mm Gensis stents inth RICA, and a .0 mm Gensis stent in the LICA, noted tobe patent in 2008 cath.  . peripheral arteries disease  09/1999   status post bilatera iliac stents jan 2001. three stents in the left common iliasc ,1 widely patent stent right common iliac, 2 overlapping stents revealed nio more 10% in-stent restenosis in 2008.   Marland Kitchen SPINAL FUSION  11/2014   SURGERY  BY DR [facility]        Home Medications    Prior to Admission medications   Medication Sig Start Date End Date Taking? Authorizing Provider  ALPRAZolam Duanne Moron) 1 MG tablet Take 0.5 mg by mouth as needed for anxiety or sleep.  02/16/14  Yes [provider]  amLODipine (NORVASC) 10 MG tablet Take 1 tablet (10 mg total) by mouth daily. Patient not taking: Reported on 04/11/2018 06/18/17   Leonie Man, MD  ARIPiprazole (ABILIFY) 5 MG tablet Take 5 mg by mouth daily.    [provider]  atorvastatin (LIPITOR) 40 MG tablet Take 1 tablet (40 mg total) by mouth daily. Patient not taking: Reported on 04/11/2018 06/18/17   Leonie Man, MD  clopidogrel (PLAVIX) 75 MG tablet Take 1 tablet (75 mg total) by mouth daily. Patient not taking: Reported on 04/11/2018 06/18/17   Leonie Man, MD  Cyanocobalamin (VITAMIN B-12 CR PO) Take by mouth daily. Reported on 08/26/2015    [provider]  metoprolol succinate (TOPROL XL) 25 MG 24 hr tablet Take 1 tablet (25 mg total) by mouth daily. Patient not taking: Reported on 04/11/2018 06/22/17   Leonie Man, MD  Multiple Vitamin (MULTIVITAMIN) tablet Take 1 tablet by mouth daily.    [provider]  Omega-3 Fatty Acids ([employer] FISH OIL) 1000 MG CAPS Take by mouth.    [provider]  PROAIR HFA 108 (90 Base) MCG/ACT inhaler Inhale 1 puff into the lungs as needed. 04/03/16   [provider]  ramipril (ALTACE) 10 MG capsule Take 1 capsule (10 mg total) by mouth daily. Patient not taking: Reported on 04/11/2018 06/18/17   Leonie Man, MD    Family History Family History  Problem Relation Age of Onset  . Cancer Mother   . Heart disease Maternal Grandfather     Social History Social History   Tobacco Use  . Smoking status: Former Smoker    Packs/day: 1.00    Years: 38.00    Pack years: 38.00    Types: Cigarettes    Last attempt to quit: 01/01/2017    Years since quitting: 1.2  . Smokeless  tobacco: Never Used  Substance Use Topics  . Alcohol use: Yes    Alcohol/week: 14.0 standard drinks    Types: 14 Glasses of wine per week  . Drug use: No     Allergies   Patient has no known allergies.   Review of Systems Review of Systems  All other systems reviewed and are negative.    Physical Exam Updated Vital Signs BP (!) 148/102 (BP Location: Left Arm)   Pulse 73   Temp 98.2 F (36.8 C) (Oral)   Resp 11   Ht 1.676 m (5\' 6" )   Wt 63.5 kg   SpO2 96%   BMI 22.60 kg/m   Physical Exam  Constitutional: No distress.  HENT:  Head: Normocephalic and atraumatic.  Right Ear: External ear normal.  Left Ear:  External ear normal.  Eyes: Conjunctivae are normal. Right eye exhibits no discharge. Left eye exhibits no discharge. No scleral icterus.  Neck: Neck supple. No tracheal deviation present.  Cardiovascular: Normal rate, regular rhythm and intact distal pulses.  Pulmonary/Chest: Effort normal and breath sounds normal. No stridor. No respiratory distress. He has no wheezes. He has no rales.  Abdominal: Soft. Bowel sounds are normal. He exhibits no distension. There is no tenderness. There is no rebound and no guarding.  Musculoskeletal: He exhibits no edema or tenderness.  Neurological: He is alert. He has normal strength. No cranial nerve deficit (no facial droop, extraocular movements intact, no slurred speech) or sensory deficit. He exhibits normal muscle tone. He displays no seizure activity. Coordination normal.  Skin: Skin is warm and dry. No rash noted.  Psychiatric: He exhibits a depressed mood. He expresses homicidal ideation. He expresses homicidal plans.  Nursing note and vitals reviewed.    ED Treatments / Results  Labs (all labs ordered are listed, but only abnormal results are displayed) Labs Reviewed  ACETAMINOPHEN LEVEL - Abnormal; Notable for the following components:      Result Value   Acetaminophen (Tylenol), Serum <10 (*)    All other  components within normal limits  RAPID URINE DRUG SCREEN, HOSP PERFORMED - Abnormal; Notable for the following components:   Benzodiazepines POSITIVE (*)    All other components within normal limits  COMPREHENSIVE METABOLIC PANEL  SALICYLATE LEVEL  ETHANOL  CBC WITH DIFFERENTIAL/PLATELET  CBG MONITORING, ED    EKG EKG Interpretation  Date/Time:  Thursday April 11 2018 18:20:31 EDT Ventricular Rate:  71 PR Interval:    QRS Duration: 86 QT Interval:  404 QTC Calculation: 439 R Axis:   70 Text Interpretation:  Sinus rhythm No significant change since last tracing Confirmed by Dorie Rank [id number]) on 04/11/2018 7:08:36 PM   Radiology No results found.  Procedures Procedures (including critical care time)  Medications Ordered in ED Medications  0.9 %  sodium chloride infusion ( Intravenous New Bag/Given 04/11/18 2136)  amLODipine (NORVASC) tablet 10 mg (has no administration in time range)  ARIPiprazole (ABILIFY) tablet 5 mg (has no administration in time range)  atorvastatin (LIPITOR) tablet 40 mg (has no administration in time range)  clopidogrel (PLAVIX) tablet 75 mg (has no administration in time range)  metoprolol succinate (TOPROL-XL) 24 hr tablet 25 mg (has no administration in time range)  albuterol (PROVENTIL HFA;VENTOLIN HFA) 108 (90 Base) MCG/ACT inhaler 1 puff (has no administration in time range)  ramipril (ALTACE) capsule 10 mg (has no administration in time range)     Initial Impression / Assessment and Plan / ED Course  I have reviewed the triage vital signs and the nursing notes.  Pertinent labs & imaging results that were available during my care of the patient were reviewed by me and considered in my medical decision making (see chart for details).    Patient presented to the emergency room after an intentional drug overdose.  Patient's drug screen was positive for benzodiazepine.  He denied any other ingestion.  Patient's laboratory tests are reassuring.   His clinical symptoms do correlate with a benzodiazepine overdose.  He appears medically stable at this point.  I will consult with psychiatry.  Final Clinical Impressions(s) / ED Diagnoses   Final diagnoses:  Suicide attempt [facility])  Intentional benzodiazepine overdose, initial encounter [facility])      Dorie Rank, MD 04/11/18 2143

## 2018-04-11 NOTE — ED Triage Notes (Addendum)
Pt reports taking 8mg  of Xanax and took 2 regular shots of Liquor between 1400-1430 in attempt to end his life.  Pt reports he recently moved in with his ex-wife who is at bedside and it has been "a lot" for him to deal with.  He reports he had to sell some of his stuff because he could not take them with him to his ex-wife's home.  His house is being rented out.  He states he has mild deficits from a previous stroke and is not able to care for himself, that's why he moved in with his ex-wife and their son.  His ex-wife reports the pt's son was concerned because pt was sleeping too long in his room so he went to check on him and called 911 d/t lethargy and pt reporting to him that he took xanax and drank liquor.

## 2018-04-11 NOTE — ED Notes (Signed)
Patient arrived to unit via stretcher. Patient brightens on approach and is cooperative and pleasant with staff. No distress noted at this time. Patient verbally contracts for safety and denies current plans to harm himself.

## 2018-04-11 NOTE — ED Notes (Signed)
Bed: WA17 Expected date:  Expected time:  Means of arrival:  Comments: Hold 17 Xanax 8mg  chased with Ilda Basset with intent to harm self

## 2018-04-11 NOTE — Progress Notes (Signed)
TTS consulted with Patriciaann Clan, PA who recommends inpt treatment. EDP Dorie Rank, MD has been advised. Pt's nurse Rosezella Rumpf T, RN has also been notified of the disposition.  Lind Covert, MSW, LCSW Therapeutic Triage Specialist  [phone]

## 2018-04-11 NOTE — ED Notes (Signed)
Family at bedside. 

## 2018-04-12 ENCOUNTER — Other Ambulatory Visit (HOSPITAL_COMMUNITY): Payer: Medicare Other

## 2018-04-12 DIAGNOSIS — T1491XA Suicide attempt, initial encounter: Secondary | ICD-10-CM | POA: Insufficient documentation

## 2018-04-12 DIAGNOSIS — F329 Major depressive disorder, single episode, unspecified: Secondary | ICD-10-CM | POA: Diagnosis not present

## 2018-04-12 DIAGNOSIS — F419 Anxiety disorder, unspecified: Secondary | ICD-10-CM | POA: Diagnosis not present

## 2018-04-12 DIAGNOSIS — T424X2A Poisoning by benzodiazepines, intentional self-harm, initial encounter: Secondary | ICD-10-CM | POA: Diagnosis not present

## 2018-04-12 NOTE — BH Assessment (Signed)
[facility] Assessment Progress Note  Per Buford Dresser, DO, this pt does not require psychiatric hospitalization at this time.  Pt is to be discharged from [facility] with recommendation to follow up with a male therapist on the Medicare panel, in keeping with pt's wishes.  Discharge instructions advise pt to follow up with [facility], LCSW.  Pt's nurse has been notified.  Jalene Mullet, Reader Triage Specialist [phone]

## 2018-04-12 NOTE — Consult Note (Addendum)
[facility] Psych ED Discharge  04/12/2018 12:04 PM Terry Kemp  MRN:  [id number] Principal Problem: Anxiety and depression Discharge Diagnoses:  Patient Active Problem List   Diagnosis Date Noted  . Intentional benzodiazepine overdose ([place]) [T42.4X2A]   . Suicide attempt ([place]) [T14.91XA]   . History of stroke [Z86.73] 04/03/2017  . Stroke ([place]) [I63.9] 09/15/2015  . HLD (hyperlipidemia) [E78.5] 09/15/2015  . PVD (peripheral vascular disease) ([place]) [I73.9] 09/15/2015  . Tobacco use disorder [F17.200] 09/15/2015  . Essential hypertension [I10] 12/23/2014  . Current every day smoker - 38 Pk year. [F17.200] 12/23/2014  . Spondylolisthesis at L5-S1 level [M43.17] 11/09/2014  . Hyperlipidemia with target LDL less than 70 [E78.5] 11/04/2014  . TIA (transient ischemic attack) [G45.9] 04/22/2014  . Depression with anxiety [F41.8] 04/22/2014  . CAD S/P RCA DES 2006 [I25.10, Z98.61] 04/16/2014  . Peripheral vascular disease ([place]) [I73.9] 04/16/2014  . 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 Pt denies suicidal/homicidal ideation, denies auditory/visual hallucinations and does not appear to be responding to internal stimuli. Pt stated he took too many Xanax in an effort to get to sleep because he was all keyed up because he forgot his ex-wife's birthday. Pt stated he has been prescribed Xanax by his PCP for anxiety. Pt has had two previous strokes and is trying to be able to do some of the things he used to do. Pt's UDS and BAL negative. Pt's EKG reviewed and WNL. No other diagnostics ordered this admission. Pt lives with his ex-wife who was present during assessment and verified the information. Pt is not considered a danger to himself or others and is open to seeing a therapist upon discharge. Pt is stable and psychiatrically clear for discharge.   Total Time spent with patient: 45 minutes  Past Psychiatric  History: Anxiety   Past Medical History:  Past Medical History:  Diagnosis Date  . Anxiety   . Arthritis   . Coronary artery disease may 2006   ST elevation MI -CATH/PCI-RCA; CATH 2008 FOR INFERIOR ISCHEMIA  . Depression   . Hyperlipidemia   . Hypertension   . Peripheral vascular disease ([place]) 04/1998   bilateral iliac arteries PTA  and  stenting in 04/30/1998; In 2006-subsequent right common iliac stent occulsion and in stent restenosis, left common iliac  . Stroke [facility]) 2013; December 2016   a) Felt to be a TIA;; b) Imaging confirmed CVA - mild residual R-sided weakness   Past Surgical History:  Procedure Laterality Date  . ABDOMINAL AOTROGRAM  07/17/2007   ADDTION WITH CARDIAC CATH---(INFRARENAL WITH BIFEMORAL RUNOFF )----WIDELY PATENT STENTS TO LEFT AND RIGHT COMMON ILIACS  . CARDIAC CATHETERIZATION  01/10/2005   DISTAL LEFT MAIN 50% STENOSIS WITH CALCIFIED,100% MID RCA OCCLUSION WHICH  was the infaract-related vessel---- peripheral vascular obstructive disease--100% occlusion rigt common iliac stent:75% stenosis at the otflow of the left iliac,LV systolic fx,EF 70%  . CARDIAC CATHETERIZATION  07/17/2007   40% distal left main stenosis, 80% stenosis RCA distally  just widely patent RCAstent that has a component of six lesion of 40% and superimosed stenoss of 80% due to spasm--widely patent stents left and left  common illiacs  . CORONARY ANGIOPLASTY WITH STENT PLACEMENT  01/10/2005   abdominal aortography with bifemoral runoff, PCI-cypher 3.0 x 28 mmSTENTING OF THE MID RCA  . DOPPLER ECHOCARDIOGRAPHY  06/03/2007   EF =>55%,NORMAL LV FUNCTION  . EYE SURGERY Bilateral 2000  cataracts  . lower extremities arterial doppler Bilateral 01/26/2005,09/28/2004   01/26/05-- right ABI -MODERATE,LEFT ABI MILLD -RIGHT CIA STENT OCCLUDED ,RIGHT IIA REVERSIBLE FLOW;LEFT CIA >70%--09/28/2004- RGT ABI'S 76, LFT ABI'S102  . NM MYOCAR PERF EJECTION FRACTION  06/03/2007   REST/STRESS -- EF 69% MILD  INFEROAPICAL ISCHEMIA NOTED - Referred for CATH - RCA PCI  . NM MYOVIEW LTD  11/2014   Low Risk - "Diaphragmatic attenuation", no infarct or ischemia  . peripheral abd aortic cath  04/04/2005   performed by Dr Rollene Fare--- in 200[address],in -stent restenosis left iliac. August  2006, kissing balloon  PTA with kissing stents placed in bilateral iliac arteries ;2 overlapping 7.0 x 39 mm followed by 7.0 x 18 mm Gensis stents inth RICA, and a .0 mm Gensis stent in the LICA, noted tobe patent in 2008 cath.  . peripheral arteries disease  09/1999   status post bilatera iliac stents jan 2001. three stents in the left common iliasc ,1 widely patent stent right common iliac, 2 overlapping stents revealed nio more 10% in-stent restenosis in 2008.   Marland Kitchen SPINAL FUSION  11/2014   SURGERY BY DR Ellene Route   Family History:  Family History  Problem Relation Age of Onset  . Cancer Mother   . Heart disease Maternal Grandfather    Family Psychiatric  History: Denies  Social History:  Social History   Substance and Sexual Activity  Alcohol Use Yes  . Alcohol/week: 14.0 standard drinks  . Types: 14 Glasses of wine per week    Social History   Substance and Sexual Activity  Drug Use No   Social History   Socioeconomic History  . Marital status: Single    Spouse name: Not on file  . Number of children: 2  . Years of education: college  . Highest education level: Not on file  Occupational History  . Not on file  Social Needs  . Financial resource strain: Not on file  . Food insecurity:    Worry: Not on file    Inability: Not on file  . Transportation needs:    Medical: Not on file    Non-medical: Not on file  Tobacco Use  . Smoking status: Former Smoker    Packs/day: 1.00    Years: 38.00    Pack years: 38.00    Types: Cigarettes    Last attempt to quit: 01/01/2017    Years since quitting: 1.2  . Smokeless tobacco: Never Used  Substance and Sexual Activity  . Alcohol use: Yes    Alcohol/week: 14.0  standard drinks    Types: 14 Glasses of wine per week  . Drug use: No  . Sexual activity: Not on file  Lifestyle  . Physical activity:    Days per week: Not on file    Minutes per session: Not on file  . Stress: Not on file  Relationships  . Social connections:    Talks on phone: Not on file    Gets together: Not on file    Attends religious service: Not on file    Active member of club or organization: Not on file    Attends meetings of clubs or organizations: Not on file    Relationship status: Not on file  Other Topics Concern  . Not on file  Social History Narrative  . Not on file    Has this patient used any form of tobacco in the last 30 days? (Cigarettes, Smokeless Tobacco, Cigars, and/or Pipes) Prescription not provided because:  Pt declined  Current Medications: Current Facility-Administered Medications  Medication Dose Route Frequency Provider Last Rate Last Dose  . 0.9 %  sodium chloride infusion   Intravenous Continuous Dorie Rank, MD 125 mL/hr at 04/11/18 2136    . albuterol (PROVENTIL HFA;VENTOLIN HFA) 108 (90 Base) MCG/ACT inhaler 1 puff  1 puff Inhalation Q6H PRN Dorie Rank, MD      . amLODipine (NORVASC) tablet 10 mg  10 mg Oral Daily Dorie Rank, MD   10 mg at 04/12/18 8563  . ARIPiprazole (ABILIFY) tablet 5 mg  5 mg Oral Daily Dorie Rank, MD   5 mg at 04/12/18 1497  . atorvastatin (LIPITOR) tablet 40 mg  40 mg Oral Daily Dorie Rank, MD   40 mg at 04/12/18 0929  . clopidogrel (PLAVIX) tablet 75 mg  75 mg Oral Daily Dorie Rank, MD   75 mg at 04/12/18 0263  . metoprolol succinate (TOPROL-XL) 24 hr tablet 25 mg  25 mg Oral Daily Dorie Rank, MD   25 mg at 04/12/18 0929  . ramipril (ALTACE) capsule 10 mg  10 mg Oral Daily Dorie Rank, MD   10 mg at 04/12/18 7858   Current Outpatient Medications  Medication Sig Dispense Refill  . ALPRAZolam (XANAX) 1 MG tablet Take 0.5 mg by mouth as needed for anxiety or sleep.     Marland Kitchen amLODipine (NORVASC) 10 MG tablet Take 1 tablet  (10 mg total) by mouth daily. (Patient not taking: Reported on 04/11/2018) 90 tablet 3  . ARIPiprazole (ABILIFY) 5 MG tablet Take 5 mg by mouth daily.    Marland Kitchen atorvastatin (LIPITOR) 40 MG tablet Take 1 tablet (40 mg total) by mouth daily. (Patient not taking: Reported on 04/11/2018) 90 tablet 3  . clopidogrel (PLAVIX) 75 MG tablet Take 1 tablet (75 mg total) by mouth daily. (Patient not taking: Reported on 04/11/2018) 90 tablet 3  . Cyanocobalamin (VITAMIN B-12 CR PO) Take by mouth daily. Reported on 08/26/2015    . metoprolol succinate (TOPROL XL) 25 MG 24 hr tablet Take 1 tablet (25 mg total) by mouth daily. (Patient not taking: Reported on 04/11/2018) 90 tablet 3  . Multiple Vitamin (MULTIVITAMIN) tablet Take 1 tablet by mouth daily.    . Omega-3 Fatty Acids ([employer] FISH OIL) 1000 MG CAPS Take by mouth.    Marland Kitchen PROAIR HFA 108 (90 Base) MCG/ACT inhaler Inhale 1 puff into the lungs as needed.    . ramipril (ALTACE) 10 MG capsule Take 1 capsule (10 mg total) by mouth daily. (Patient not taking: Reported on 04/11/2018) 90 capsule 3   PTA Medications:  (Not in a hospital admission)  Musculoskeletal: Strength & Muscle Tone: within normal limits Gait & Station: not assessed Patient leans: N/A  Psychiatric Specialty Exam: Physical Exam  Nursing note and vitals reviewed. Constitutional: He is oriented to person, place, and time. He appears well-developed and well-nourished.  HENT:  Head: Normocephalic and atraumatic.  Neck: Normal range of motion.  Respiratory: Effort normal.  Musculoskeletal: Normal range of motion.  Neurological: He is alert and oriented to person, place, and time.  Psychiatric: His speech is normal and behavior is normal. Thought content normal. His mood appears anxious. Cognition and memory are normal. He expresses impulsivity. He exhibits a depressed mood.    Review of Systems  Psychiatric/Behavioral: Positive for depression. Negative for hallucinations, memory loss, substance abuse and  suicidal ideas. The patient is nervous/anxious. The patient does not have insomnia.   All other systems reviewed and are  negative.   Blood pressure (!) 152/84, pulse 60, temperature 98.2 F (36.8 C), temperature source Oral, resp. rate 12, height 5\' 6"  (1.676 m), weight 63.5 kg, SpO2 90 %.Body mass index is 22.6 kg/m.  General Appearance: Casual  Eye Contact:  Good  Speech:  Clear and Coherent and Normal Rate  Volume:  Normal  Mood:  Anxious and Depressed  Affect:  Congruent and Depressed  Thought Process:  Coherent, Goal Directed, Linear and Descriptions of Associations: Intact  Orientation:  Full (Time, Place, and Person)  Thought Content:  Logical  Suicidal Thoughts:  No  Homicidal Thoughts:  No  Memory:  Immediate;   Good Recent;   Good Remote;   Fair  Judgement:  Fair  Insight:  Fair  Psychomotor Activity:  Normal  Concentration:  Concentration: Good and Attention Span: Good  Recall:  Good  Fund of Knowledge:  Good  Language:  Good  Akathisia:  No  Handed:  Right  AIMS (if indicated):   N/A  Assets:  Agricultural consultant Housing Social Support  ADL's:  Intact  Cognition:  WNL  Sleep:   N/A     Demographic Factors:  Male, Age 60 or older, Caucasian and Unemployed  Loss Factors: Financial problems/change in socioeconomic status  Historical Factors: Impulsivity  Risk Reduction Factors:   Sense of responsibility to family and Living with another person, especially a relative  Continued Clinical Symptoms:  Depression:   Impulsivity  Cognitive Features That Contribute To Risk:  Closed-mindedness    Suicide Risk:  Minimal: No identifiable suicidal ideation.  Patients presenting with no risk factors but with morbid ruminations; may be classified as minimal risk based on the severity of the depressive symptoms    Plan Of Care/Follow-up recommendations:  Activity:  as tolerated Diet:  Heart healthy  Disposition: Discharge  Home Follow up with August Luz LCSW for therapy for your anxiety and depression Take all medications exactly as prescribed by your outpatient provider Follow up with your PCP for any medical or medication issues you may have.  Avoid the use of alcohol while taking benzodiazepines  Ethelene Hal, NP 04/12/2018, 12:04 PM   Patient seen face-to-face for psychiatric evaluation, chart reviewed and case discussed with the physician extender and developed treatment plan. Reviewed the information documented and agree with the treatment plan.  Buford Dresser, DO 04/12/18 4:19 PM

## 2018-04-12 NOTE — Discharge Instructions (Signed)
For your behavioral health needs, you are advised to follow up with an outpatient therapist.  Contact August Luz, LCSW at your earliest opportunity to ask about scheduling an intake appointment:       August Luz, LCSW      [address]      [place], [place] [address]      [phone]

## 2018-04-15 ENCOUNTER — Other Ambulatory Visit (HOSPITAL_COMMUNITY): Payer: Medicare Other

## 2018-04-16 ENCOUNTER — Other Ambulatory Visit (HOSPITAL_COMMUNITY): Payer: Medicare Other

## 2018-04-17 ENCOUNTER — Other Ambulatory Visit (HOSPITAL_COMMUNITY): Payer: Medicare Other

## 2018-04-18 ENCOUNTER — Other Ambulatory Visit (HOSPITAL_COMMUNITY): Payer: Medicare Other

## 2018-04-18 DIAGNOSIS — F321 Major depressive disorder, single episode, moderate: Secondary | ICD-10-CM | POA: Diagnosis not present

## 2018-04-18 DIAGNOSIS — F410 Panic disorder [episodic paroxysmal anxiety] without agoraphobia: Secondary | ICD-10-CM | POA: Diagnosis not present

## 2018-04-19 ENCOUNTER — Other Ambulatory Visit (HOSPITAL_COMMUNITY): Payer: Medicare Other

## 2018-04-22 ENCOUNTER — Other Ambulatory Visit (HOSPITAL_COMMUNITY): Payer: Medicare Other

## 2018-04-23 ENCOUNTER — Other Ambulatory Visit (HOSPITAL_COMMUNITY): Payer: Medicare Other

## 2018-04-24 ENCOUNTER — Other Ambulatory Visit (HOSPITAL_COMMUNITY): Payer: Medicare Other

## 2018-04-25 ENCOUNTER — Other Ambulatory Visit (HOSPITAL_COMMUNITY): Payer: Medicare Other

## 2018-04-26 ENCOUNTER — Other Ambulatory Visit (HOSPITAL_COMMUNITY): Payer: Medicare Other

## 2018-04-29 ENCOUNTER — Other Ambulatory Visit (HOSPITAL_COMMUNITY): Payer: Medicare Other

## 2018-04-30 ENCOUNTER — Other Ambulatory Visit (HOSPITAL_COMMUNITY): Payer: Medicare Other

## 2018-05-01 ENCOUNTER — Other Ambulatory Visit (HOSPITAL_COMMUNITY): Payer: Medicare Other

## 2018-05-02 ENCOUNTER — Other Ambulatory Visit (HOSPITAL_COMMUNITY): Payer: Medicare Other

## 2018-05-03 ENCOUNTER — Other Ambulatory Visit (HOSPITAL_COMMUNITY): Payer: Medicare Other

## 2018-05-20 DIAGNOSIS — F321 Major depressive disorder, single episode, moderate: Secondary | ICD-10-CM | POA: Diagnosis not present

## 2018-05-28 DIAGNOSIS — R3911 Hesitancy of micturition: Secondary | ICD-10-CM | POA: Diagnosis not present

## 2018-05-28 DIAGNOSIS — G4700 Insomnia, unspecified: Secondary | ICD-10-CM | POA: Diagnosis not present

## 2018-05-28 DIAGNOSIS — F411 Generalized anxiety disorder: Secondary | ICD-10-CM | POA: Diagnosis not present

## 2018-05-28 DIAGNOSIS — F324 Major depressive disorder, single episode, in partial remission: Secondary | ICD-10-CM | POA: Diagnosis not present

## 2018-05-28 DIAGNOSIS — Z23 Encounter for immunization: Secondary | ICD-10-CM | POA: Diagnosis not present

## 2018-06-03 DIAGNOSIS — F321 Major depressive disorder, single episode, moderate: Secondary | ICD-10-CM | POA: Diagnosis not present

## 2018-06-05 ENCOUNTER — Encounter: Payer: Self-pay | Admitting: Cardiology

## 2018-06-05 ENCOUNTER — Ambulatory Visit (INDEPENDENT_AMBULATORY_CARE_PROVIDER_SITE_OTHER): Payer: Medicare Other | Admitting: Cardiology

## 2018-06-05 VITALS — BP 140/80 | HR 67 | Ht 65.0 in | Wt 142.2 lb

## 2018-06-05 DIAGNOSIS — E785 Hyperlipidemia, unspecified: Secondary | ICD-10-CM | POA: Diagnosis not present

## 2018-06-05 DIAGNOSIS — I2510 Atherosclerotic heart disease of native coronary artery without angina pectoris: Secondary | ICD-10-CM

## 2018-06-05 DIAGNOSIS — Z9861 Coronary angioplasty status: Secondary | ICD-10-CM

## 2018-06-05 DIAGNOSIS — I63019 Cerebral infarction due to thrombosis of unspecified vertebral artery: Secondary | ICD-10-CM

## 2018-06-05 DIAGNOSIS — I739 Peripheral vascular disease, unspecified: Secondary | ICD-10-CM | POA: Diagnosis not present

## 2018-06-05 DIAGNOSIS — I10 Essential (primary) hypertension: Secondary | ICD-10-CM | POA: Diagnosis not present

## 2018-06-05 DIAGNOSIS — F17200 Nicotine dependence, unspecified, uncomplicated: Secondary | ICD-10-CM

## 2018-06-05 NOTE — Patient Instructions (Addendum)
No medication changes    Your physician wants you to follow-up in 12 months with DR [facility] You will receive a reminder letter in the mail two months in advance. If you don't receive a letter, please call our office to schedule the follow-up appointment.   If you need a refill on your cardiac medications before your next appointment, please call your pharmacy.

## 2018-06-05 NOTE — Progress Notes (Signed)
PCP: Lawerance Cruel, MD  Clinic Note: Chief Complaint  Patient presents with  . Follow-up    No Cardiac Complaints -- See Problem List below   1. CAD S/P RCA DES  2006 - Negative Myoview March 2016  2. Essential hypertension   3. Cerebrovascular accident (CVA) due to embolism of other precerebral artery ([place]) - also TIA  4. Hyperlipidemia with target LDL less than 70   5.  Current every day smoker - 38 Pk year.   - Also PAD - s/p Bilateral Iliac Stents (2000 & 2001) - Dr. Scot Dock.  HPI: Terry Kemp is a 71 y.o. male with a PMH notable for CAD-PCI  who presents today for Annual f/u.  I last saw him 05/2016 - was doing well with the exception of CVA/TIA in December 2016.Marland Kitchen  Terry Kemp was last seen on 06/18/17 by Almyra Deforest, PA-C.  He acknowledged he quit smoking in April 2018.  Unfortunately, he suffered a nervous breakdown with severe depression prior to this visit.  He quit the [facility] because of psychiatric issues.  Unfortunately as result of all this, he had to move in with his ex-wife.  No major changes made.  Recent Hospitalizations:  MDD (04/11/18 - ER for Psych issue) -> has hard time focusing & concentrating.  Hard to cook for himself.  Moved back in with X-wife.  Studies Personally Reviewed - (if available, images/films reviewed: From Epic Chart or Care Everywhere)  No new studies reviewed.  However past medical and surgical history was updated with accurate information on prior cardiac and peripheral catheterizations, echocardiograms and stress test.  Interval History: Terry Kemp presents here today for annual follow-up overall doing fine from a cardiac standpoint.  Most of his issues now will stem around his major depressive disorder.  Recently in the hospital for similar issue.  Life has been quite difficult for him since his breakdown back in 2008.  He said moving with his ex-wife.  He quit working at [employer].  He is now actually starting to get back and doing  some exercise walking about a 1/2 to 1 mile a day.  With this level of activity, he is not noticing any chest tightness or pressure with rest or exertion. He also denies any claudication but he does feel that his right foot and hand feel cool and cold all the time.  This is been ongoing since his stroke.  Otherwise no residual effects from his stroke.  No recurrent TIA or amaurosis fugax symptoms. No heart failure symptoms of PND, orthopnea or edema.  No palpitations, lightheadedness, dizziness, weakness or syncope/near syncope. No TIA/amaurosis fugax symptoms. No melena, hematochezia, hematuria, or epstaxis. No claudication.  He does admit that he had not yet taking his blood pressure medicine today because he was running late. He is gradually been titrated up to 200 mg of sertraline.  He has not yet taken the new dose.  ROS: A comprehensive was performed. Review of Systems  Constitutional: Negative for malaise/fatigue and weight loss.  HENT: Negative for congestion and nosebleeds.   Respiratory: Negative for cough and shortness of breath.   Gastrointestinal: Negative for constipation, heartburn and nausea.  Musculoskeletal: Negative for joint pain and myalgias.  Neurological: Positive for tingling (Tingling and cold sensation in the right hand and foot.). Negative for focal weakness and weakness.  Psychiatric/Behavioral: Positive for depression. The patient is nervous/anxious and has insomnia.   All other systems reviewed and are negative.  I have reviewed  and (if needed) personally updated the patient's problem list, medications, allergies, past medical and surgical history, social and family history.   Past Medical History:  Diagnosis Date  . Anxiety   . Arthritis   . CAD S/P percutaneous coronary angioplasty 01/2005   ST elevation MI -CATH/PCI-RCA (Cypher DES 3 x 28 - 3.36mm); CATH 2008 FOR INFERIOR ISCHEMIA - 80% dRCA ISR reduced to 40% with NTG - spasm.  . Depression   .  Hyperlipidemia   . Hypertension   . PAD (peripheral artery disease) ([place]) 2000   bilateral iliac arteries PTA  and  stenting in 04/30/1998; In 2006-subsequent  R Common Iliac (RCIA) 100% ISR, 75 % LCIA -- staged Bifuracation Kissing Stent creating new Aortic Carina.-- Overlapping 7 mm x 56mm & 10mm stents RCIA & kissing 7 mm x 29 mm in LCIA.  . Stroke [facility]) 2013; 08/2015   a) Felt to be a TIA;; b) Imaging confirmed CVA - mild residual R-sided weakness    Past Surgical History:  Procedure Laterality Date  . ABDOMINAL AOTROGRAM  07/17/2007   ADDTION WITH CARDIAC CATH---(INFRARENAL WITH BIFEMORAL RUNOFF )----WIDELY PATENT STENTS TO LEFT AND RIGHT COMMON ILIACS  . CARDIAC CATHETERIZATION  01/10/2005   Inf STEMI: dLM 50% - calcified. mRCA 100% (infaract-related vessel) --> DES PCI; AbdAo-Iliac Angio: PAD R Common Iliac (RCIA) 100% ISR, LCIA 75% ISR @ outflow tract of the left iliac. LVEF~ 60%  . CARDIAC CATHETERIZATION  07/17/2007   40% dLM (previously read as 50%); 80% mRCA - distal stent ISR --> reduced to 40% with NTG = due to spasm--widely patent bilaeral common illiacs  . CORONARY ANGIOPLASTY WITH STENT PLACEMENT  01/10/2005   mRCA DES PCI - CYPHER DES 3.0 x 28 mm -> post-dilated to 3.25 mm  . EYE SURGERY Bilateral 2000   cataracts  . lower extremities arterial doppler Bilateral 01/26/2005,09/28/2004   01/26/05-- right ABI -MODERATE,LEFT ABI MILLD -RIGHT CIA STENT OCCLUDED ,RIGHT IIA REVERSIBLE FLOW;LEFT CIA >70%--09/28/2004- RGT ABI'S 76, LFT ABI'S102  . NM MYOCAR PERF EJECTION FRACTION  06/03/2007   REST/STRESS -- EF 69% MILD INFEROAPICAL ISCHEMIA NOTED - Referred for CATH - RCA PCI  . NM MYOVIEW LTD  11/2014   Low Risk - "Diaphragmatic attenuation", no infarct or ischemia  . peripheral abd aortic cath  04/04/2005   performed by Dr Rollene Fare---  LCIA 75% ISR, 100% RCIA ISR: kissing balloon PTA  --> Kissing STENTs Bilateral CIA (RCIA 2 overlapping 7.0 x 39 mm & 7.0 x 18 mm Gensis stents,  LCIA - 42mm x 29 mm Gensis stent (noted to be patent in 2008 Cath)  . peripheral arteries disease  09/1999   status post bilatera iliac stents jan 2001. three stents in the left common iliasc ,1 widely patent stent right common iliac, 2 overlapping stents revealed nio more 10% in-stent restenosis in 2008.   Marland Kitchen SPINAL FUSION  11/2014   SURGERY BY DR Ellene Route  . TRANSTHORACIC ECHOCARDIOGRAM  09/2015   Normal LV size and systolic function, EF 64-40%. Normal RV size.  No valvular lesions.      Current Meds  Medication Sig  . ALPRAZolam (XANAX) 1 MG tablet Take 0.5 mg by mouth as needed for anxiety or sleep.   Marland Kitchen amLODipine (NORVASC) 10 MG tablet Take 1 tablet (10 mg total) by mouth daily.  . ARIPiprazole (ABILIFY) 5 MG tablet Take 5 mg by mouth daily.  Marland Kitchen atorvastatin (LIPITOR) 40 MG tablet Take 1 tablet (40 mg total) by mouth daily.  Marland Kitchen  clopidogrel (PLAVIX) 75 MG tablet Take 1 tablet (75 mg total) by mouth daily.  . Cyanocobalamin (VITAMIN B-12 CR PO) Take by mouth daily. Reported on 08/26/2015  . metoprolol succinate (TOPROL XL) 25 MG 24 hr tablet Take 1 tablet (25 mg total) by mouth daily.  . Multiple Vitamin (MULTIVITAMIN) tablet Take 1 tablet by mouth daily.  . ramipril (ALTACE) 10 MG capsule Take 1 capsule (10 mg total) by mouth daily.  . sertraline (ZOLOFT) 100 MG tablet Take 100 mg by mouth daily.    No Known Allergies  Social History   Tobacco Use  . Smoking status: Former Smoker    Packs/day: 1.00    Years: 38.00    Pack years: 38.00    Types: Cigarettes    Last attempt to quit: 01/01/2017    Years since quitting: 1.4  . Smokeless tobacco: Never Used  Substance Use Topics  . Alcohol use: Yes    Alcohol/week: 14.0 standard drinks    Types: 14 Glasses of wine per week  . Drug use: No   Social History   Social History Narrative  . Not on file    family history includes Cancer in his mother; Heart disease in his maternal grandfather.  Wt Readings from Last 3 Encounters:    06/05/18 142 lb 3.2 oz (64.5 kg)  04/11/18 140 lb (63.5 kg)  06/18/17 141 lb (64 kg)    PHYSICAL EXAM BP 140/80   Pulse 67   Ht 5\' 5"  (1.651 m)   Wt 142 lb 3.2 oz (64.5 kg)   BMI 23.66 kg/m  Physical Exam  Constitutional: He is oriented to person, place, and time. He appears well-developed and well-nourished. No distress.  HENT:  Head: Normocephalic and atraumatic.  Neck: Normal range of motion. Neck supple. No hepatojugular reflux and no JVD present. Carotid bruit is not present.  Cardiovascular: Normal rate, regular rhythm, normal heart sounds, intact distal pulses and normal pulses.  No extrasystoles are present. PMI is not displaced. Exam reveals no gallop and no friction rub.  No murmur heard. Pulmonary/Chest: Effort normal and breath sounds normal. No respiratory distress. He has no wheezes. He has no rales.  Abdominal: Soft. Bowel sounds are normal. He exhibits no distension. There is no tenderness. There is no rebound.  Musculoskeletal: Normal range of motion. He exhibits no edema.  Neurological: He is alert and oriented to person, place, and time.  Psychiatric: His behavior is normal. Judgment and thought content normal.  Somewhat blunted depressed affect.  Vitals reviewed.    Adult ECG Report  Rate: 67 ;  Rhythm: normal sinus rhythm and Normal axis, intervals and durations.  Normal EKG;   Narrative Interpretation: Normal EKG   Other studies Reviewed: Additional studies/ records that were reviewed today include:  Recent Labs from [facility]: Jan 09, 2018: Total cholesterol 206.  LDL 64, HDL 44.  BUN/Cr 11/0.70.   ASSESSMENT / PLAN: Problem List Items Addressed This Visit    CAD S/P RCA DES 2006 - Primary (Chronic)    Thankfully, no further anginal symptoms since his relook cath in 2008.  Last Myoview was in 2016.  Negative for ischemia.  He has moderate left main disease and stent to the RCA. Continues to be on Plavix for both PAD, stroke and coronary disease. Okay to  hold Plavix for procedures. On amlodipine metoprolol and ramipril with stable doses. ->  Amlodipine is as much for blood pressure control as it is for antianginal/antispasm effect.  Although he is  quit the [facility], he is walking 1/2 to 1 mile a day and doing relatively well from that standpoint.  I encouraged him to continue staying active.  Hopefully he will join the [facility] again.      Essential hypertension (Chronic)    Borderline blood pressure today.  He has not taken his medicines yet and tells me that they are better off at home.  He is already on max dose of amlodipine and ramipril.  Would not want to further titrate metoprolol because of his psychiatric issues. Next step would probably be to add a diuretic to the ACE inhibitor. For now, continue current meds.      Relevant Orders   EKG 12-Lead (Completed)   Hyperlipidemia with target LDL less than 70 (Chronic)    Most recent labs I have are from May.  LDL was 64.  Pretty much is close to goal is really get would not be more aggressive.    Remain on atorvastatin 40 mm daily.  If LDL level were to go up, would convert to Crestor/rosuvastatin.      Relevant Orders   EKG 12-Lead (Completed)   PAD (peripheral artery disease) (HCC) (Chronic)    No claudication symptoms. He has not had follow-up Dopplers done in quite some time.   Will need order prior to next visit.      Relevant Orders   EKG 12-Lead (Completed)   Stroke (HCC) (Chronic)    Stable.  On statin and Plavix.      Tobacco use disorder (Chronic)    He seems to successfully quit smoking now.  He is "addicted to his Nicorette tabs".  Thankfully, he has not had a cigarette in quite a long time.  He did not do well with Chantix, basically letter nervous breakdown.  I congratulated him on his efforts and encouraged him to continue to stay abstinent of cigarettes.         Current medicines are reviewed at length with the patient today.  (+/- concerns) none The following  changes have been made:  Hold off on treating with additional blood pressure medication for now  Patient Instructions  No medication changes    Your physician wants you to follow-up in 12 months with DR [facility] You will receive a reminder letter in the mail two months in advance. If you don't receive a letter, please call our office to schedule the follow-up appointment.   If you need a refill on your cardiac medications before your next appointment, please call your pharmacy.   Studies Ordered:   Orders Placed This Encounter  Procedures  . EKG 12-Lead      Terry Kemp, M.D., M.S. Interventional Cardiologist   Pager # [phone] Phone # [phone] [address] [place], [place] [address]   Thank you for choosing [facility] at [facility]!!

## 2018-06-09 ENCOUNTER — Encounter: Payer: Self-pay | Admitting: Cardiology

## 2018-06-09 NOTE — Assessment & Plan Note (Addendum)
No claudication symptoms. He has not had follow-up Dopplers done in quite some time.   Will need order prior to next visit.

## 2018-06-09 NOTE — Assessment & Plan Note (Signed)
Borderline blood pressure today.  He has not taken his medicines yet and tells me that they are better off at home.  He is already on max dose of amlodipine and ramipril.  Would not want to further titrate metoprolol because of his psychiatric issues. Next step would probably be to add a diuretic to the ACE inhibitor. For now, continue current meds.

## 2018-06-09 NOTE — Assessment & Plan Note (Signed)
Stable.  On statin and Plavix.

## 2018-06-09 NOTE — Assessment & Plan Note (Signed)
He seems to successfully quit smoking now.  He is "addicted to his Nicorette tabs".  Thankfully, he has not had a cigarette in quite a long time.  He did not do well with Chantix, basically letter nervous breakdown.  I congratulated him on his efforts and encouraged him to continue to stay abstinent of cigarettes.

## 2018-06-09 NOTE — Assessment & Plan Note (Addendum)
Thankfully, no further anginal symptoms since his relook cath in 2008.  Last Myoview was in 2016.  Negative for ischemia.  He has moderate left main disease and stent to the RCA. Continues to be on Plavix for both PAD, stroke and coronary disease. Okay to hold Plavix for procedures. On amlodipine metoprolol and ramipril with stable doses. ->  Amlodipine is as much for blood pressure control as it is for antianginal/antispasm effect.  Although he is quit the [facility], he is walking 1/2 to 1 mile a day and doing relatively well from that standpoint.  I encouraged him to continue staying active.  Hopefully he will join the [facility] again.

## 2018-06-09 NOTE — Assessment & Plan Note (Signed)
Most recent labs I have are from May.  LDL was 64.  Pretty much is close to goal is really get would not be more aggressive.    Remain on atorvastatin 40 mm daily.  If LDL level were to go up, would convert to Crestor/rosuvastatin.

## 2018-06-17 DIAGNOSIS — F321 Major depressive disorder, single episode, moderate: Secondary | ICD-10-CM | POA: Diagnosis not present

## 2018-07-15 DIAGNOSIS — F321 Major depressive disorder, single episode, moderate: Secondary | ICD-10-CM | POA: Diagnosis not present

## 2018-07-16 DIAGNOSIS — G4700 Insomnia, unspecified: Secondary | ICD-10-CM | POA: Diagnosis not present

## 2018-07-16 DIAGNOSIS — J449 Chronic obstructive pulmonary disease, unspecified: Secondary | ICD-10-CM | POA: Diagnosis not present

## 2018-07-16 DIAGNOSIS — F411 Generalized anxiety disorder: Secondary | ICD-10-CM | POA: Diagnosis not present

## 2018-07-16 DIAGNOSIS — R351 Nocturia: Secondary | ICD-10-CM | POA: Diagnosis not present

## 2018-07-16 DIAGNOSIS — I10 Essential (primary) hypertension: Secondary | ICD-10-CM | POA: Diagnosis not present

## 2018-07-29 DIAGNOSIS — F321 Major depressive disorder, single episode, moderate: Secondary | ICD-10-CM | POA: Diagnosis not present

## 2018-08-05 DIAGNOSIS — F411 Generalized anxiety disorder: Secondary | ICD-10-CM | POA: Diagnosis not present

## 2018-08-05 DIAGNOSIS — F324 Major depressive disorder, single episode, in partial remission: Secondary | ICD-10-CM | POA: Diagnosis not present

## 2018-08-19 DIAGNOSIS — F321 Major depressive disorder, single episode, moderate: Secondary | ICD-10-CM | POA: Diagnosis not present

## 2018-09-23 DIAGNOSIS — F321 Major depressive disorder, single episode, moderate: Secondary | ICD-10-CM | POA: Diagnosis not present

## 2018-09-25 ENCOUNTER — Other Ambulatory Visit: Payer: Self-pay | Admitting: Cardiology

## 2018-10-10 DIAGNOSIS — F321 Major depressive disorder, single episode, moderate: Secondary | ICD-10-CM | POA: Diagnosis not present

## 2018-10-21 DIAGNOSIS — F321 Major depressive disorder, single episode, moderate: Secondary | ICD-10-CM | POA: Diagnosis not present

## 2018-11-12 DIAGNOSIS — F321 Major depressive disorder, single episode, moderate: Secondary | ICD-10-CM | POA: Diagnosis not present

## 2018-12-02 DIAGNOSIS — F321 Major depressive disorder, single episode, moderate: Secondary | ICD-10-CM | POA: Diagnosis not present

## 2018-12-23 DIAGNOSIS — F321 Major depressive disorder, single episode, moderate: Secondary | ICD-10-CM | POA: Diagnosis not present

## 2019-01-13 DIAGNOSIS — F321 Major depressive disorder, single episode, moderate: Secondary | ICD-10-CM | POA: Diagnosis not present

## 2019-01-20 DIAGNOSIS — R413 Other amnesia: Secondary | ICD-10-CM | POA: Diagnosis not present

## 2019-01-20 DIAGNOSIS — I69359 Hemiplegia and hemiparesis following cerebral infarction affecting unspecified side: Secondary | ICD-10-CM | POA: Diagnosis not present

## 2019-01-20 DIAGNOSIS — F411 Generalized anxiety disorder: Secondary | ICD-10-CM | POA: Diagnosis not present

## 2019-01-20 DIAGNOSIS — J449 Chronic obstructive pulmonary disease, unspecified: Secondary | ICD-10-CM | POA: Diagnosis not present

## 2019-01-20 DIAGNOSIS — I10 Essential (primary) hypertension: Secondary | ICD-10-CM | POA: Diagnosis not present

## 2019-01-20 DIAGNOSIS — G4700 Insomnia, unspecified: Secondary | ICD-10-CM | POA: Diagnosis not present

## 2019-01-22 ENCOUNTER — Telehealth: Payer: Self-pay | Admitting: Neurology

## 2019-01-22 NOTE — Telephone Encounter (Signed)
Due to current COVID 19 pandemic, our office is severely reducing in office visits until further notice, in order to minimize the risk to our patients and healthcare providers.   Got in touch with patient and scheduled a virtual visit for 6/17. Patient verbalized understanding of the [email or web address] process and I have sent him an e-mail with link and directions as well as my name and office number/hours for reference. Patient understands that he will receive a call from RN to update chart.  Pt understands that although there may be some limitations with this type of visit, we will take all precautions to reduce any security or privacy concerns.  Pt understands that this will be treated like an in office visit and we will file with pt's insurance, and there may be a patient responsible charge related to this service.

## 2019-02-04 DIAGNOSIS — F321 Major depressive disorder, single episode, moderate: Secondary | ICD-10-CM | POA: Diagnosis not present

## 2019-02-18 ENCOUNTER — Other Ambulatory Visit: Payer: Self-pay | Admitting: Cardiology

## 2019-02-18 DIAGNOSIS — M2041 Other hammer toe(s) (acquired), right foot: Secondary | ICD-10-CM | POA: Diagnosis not present

## 2019-02-18 DIAGNOSIS — I10 Essential (primary) hypertension: Secondary | ICD-10-CM | POA: Diagnosis not present

## 2019-02-19 ENCOUNTER — Other Ambulatory Visit: Payer: Self-pay

## 2019-02-19 ENCOUNTER — Telehealth: Payer: Self-pay | Admitting: Neurology

## 2019-02-19 ENCOUNTER — Ambulatory Visit (INDEPENDENT_AMBULATORY_CARE_PROVIDER_SITE_OTHER): Payer: Medicare Other | Admitting: Neurology

## 2019-02-19 ENCOUNTER — Encounter: Payer: Self-pay | Admitting: Neurology

## 2019-02-19 DIAGNOSIS — R413 Other amnesia: Secondary | ICD-10-CM | POA: Diagnosis not present

## 2019-02-19 DIAGNOSIS — G3184 Mild cognitive impairment, so stated: Secondary | ICD-10-CM | POA: Diagnosis not present

## 2019-02-19 NOTE — Telephone Encounter (Signed)
Medicare/bcbs supp order sent to GI. No auth they will reach out to the patient to schedule.  

## 2019-02-19 NOTE — Progress Notes (Signed)
Virtual Visit via Video Note  I connected with Terry Kemp on 02/19/19 at  1:00 PM EDT by a video enabled telemedicine application and verified that I am speaking with the correct person using two identifiers.  Location: Patient: at home Provider: at [facility] office Referring physician :Melinda Crutch Reason for referral :memory loss   I discussed the limitations of evaluation and management by telemedicine and the availability of in person appointments. The patient expressed understanding and agreed to proceed.  This visit was performed using [email or web address] app for audio and visual.  History of Present Illness: Terry Kemp is seen today for virtual video consultation visit for evaluation for new complaints of memory loss.He is a pleasant 73 year old Caucasian male with past medical history of hypertension, hyperlipidemia, coronary artery disease with MI status post stenting in August 2006, peripheral arterial disease status post stenting of her iliac arteries, COPD, tobacco dependence, benign prostatic hypertrophy, osteoarthritis, TIAs and lacunar infarcts in the past.  He states following his last lacunar infarct in 2017 he is noticed memory difficulties which he feels have been slowly progressive.  Patient states that he is having trouble remembering mostly short-term issues.  He is no longer able to care for himself and had to reluctantly moving to live with his ex-wife.  He is having trouble cooking and doing stuff at home.  He tried hiring caregiver but it did not work out.  He can still do a few things around the house for himself and he can look after his own needs but cannot manage house.  He states he gets upset very easily though he has not had any violent outbursts.  He has longstanding history of anxiety depression and is been on multiple medications but feels that is under control.  On inquiry admits to occasional word finding difficulties and trouble completing sentences and having to stop and  midsentence.  He denies any headaches, blurred vision, double vision, gait or balance problems.  He has had no recent stroke or TIA symptoms.  He denies any prior history of significant head injury with loss of consciousness or seizures.  He has not had any recent cognitive evaluation or brain imaging study done.  There is no family history of Alzheimer's dementia though he was adopted he was able to establish contact with his birth mother but he does not know the rest of his family very well  Past medical history as stated above Past surgical history spinal fusion 2016 lens implant 1998 Medication allergies Lipitor intolerance.  Chantix intolerance.  Wellbutrin mental side effects.  Zoloft diarrhea. Medication list Xanax 1 mg daily.  Nicotine 2 mg 8 times daily.  Zoloft 100 mg daily.  Altace 10 mg daily.  Multivitamin 1 tablet daily.  Toprol-XL 25 mg daily.  Vitamin B12 daily.  Plavix 75 mg daily.  Lipitor 40 mg daily.  Abilify 5 mg daily.  Amlodipine 10 mg daily. 14 system review of systems is positive for only those symptoms documented above in history of present illness. Observations/Objective: Physical and neurological exam is limited secondary to constraints of virtual video visit.  Pleasant elderly mildly obese Caucasian male not in distress.  He is awake alert oriented to time place and person.  Is slightly diminished attention registration and recall is 1 out of 3.  Is able to name only 9 animals which can walk on 4 legs.  He is able to draw a good clock and score is 4/4.  His mood and affect appear normal.  Face is symmetric without weakness extraocular movements are full range without nystagmus.  Motor system exam reveals symmetric upper and lower extremity strength.  No focal weakness.  He can stand on either foot unsupported.  He walks with a fairly steady gait.  Assessment and Plan: 73 year old Caucasian male with past medical history of MRI negative brainstem infarct in 2012, TIA in 2013 and  silent left internal capsule infarct and left subcortical infarct in December 2016 from small vessel disease with vascular risk factors of obesity, hypertension, hyperlipidemia and cerebrovascular disease and remote smoking.  He is complaining of memory difficulties following his last stroke which have been slowly progressive likely mild cognitive impairment versus early dementia. PLAN : I had a long discussion with the patient regarding his progressive memory and cognitive difficulties and discussed differential diagnosis and evaluation and treatment plan and answered questions.  I recommend we check memory panel labs, EEG and MRI scan of the brain.  I encouraged him to increase participation in cognitively challenging activities like solving crossword puzzles, playing bridge and sudoku.  Continue Plavix for stroke prevention with strict control of hypertension with blood pressure goal below 130/90, lipids with LDL cholesterol goal below 70 mg percent and diabetes with hemoglobin A1c goal below 6.5%.  I also advised him to try to quit using nicotine lozenges that he seems to be addicted to.   Follow Up Instructions: Return for a in person office visit in a month.   I discussed the assessment and treatment plan with the patient. The patient was provided an opportunity to ask questions and all were answered. The patient agreed with the plan and demonstrated an understanding of the instructions.   The patient was advised to call back or seek an in-person evaluation if the symptoms worsen or if the condition fails to improve as anticipated.  I provided 30 minutes of non-face-to-face time during this encounter.   Antony Contras, MD

## 2019-02-21 DIAGNOSIS — F321 Major depressive disorder, single episode, moderate: Secondary | ICD-10-CM | POA: Diagnosis not present

## 2019-03-04 DIAGNOSIS — F439 Reaction to severe stress, unspecified: Secondary | ICD-10-CM | POA: Diagnosis not present

## 2019-03-04 DIAGNOSIS — I10 Essential (primary) hypertension: Secondary | ICD-10-CM | POA: Diagnosis not present

## 2019-03-13 ENCOUNTER — Other Ambulatory Visit: Payer: Self-pay

## 2019-03-13 ENCOUNTER — Other Ambulatory Visit: Payer: Self-pay | Admitting: Cardiology

## 2019-03-13 ENCOUNTER — Ambulatory Visit
Admission: RE | Admit: 2019-03-13 | Discharge: 2019-03-13 | Disposition: A | Discharge status: Home / Self Care | Payer: Medicare Other | Source: Ambulatory Visit | Attending: Neurology | Admitting: Neurology

## 2019-03-13 DIAGNOSIS — R413 Other amnesia: Secondary | ICD-10-CM | POA: Diagnosis not present

## 2019-03-13 MED ORDER — GADOBENATE DIMEGLUMINE 529 MG/ML IV SOLN
13.0000 mL | Freq: Once | INTRAVENOUS | Status: AC | PRN
  Administered 2019-03-13: 13 mL via INTRAVENOUS

## 2019-03-14 NOTE — Telephone Encounter (Signed)
Rx(s) sent to pharmacy electronically.  

## 2019-03-24 ENCOUNTER — Telehealth: Payer: Self-pay

## 2019-03-24 NOTE — Telephone Encounter (Signed)
-----   Message from Garvin Fila, MD sent at 03/21/2019  3:03 PM EDT ----- Terry Kemp inform the patient that MRI scan of the brain shows mild age-appropriate changes of hardening of the arteries.  No definite stroke tumor or anything to worry about.  Compared to previous MRI from 2017 these changes appear to have progressed but this is to be expected.  No worrisome finding.

## 2019-03-24 NOTE — Telephone Encounter (Signed)
Notes recorded by Marval Regal, RN on 03/24/2019 at 11:57 AM EDT  I called patient that MRI scan of the brain shows mild age-appropriate changes of hardening of the arteries. No definite stroke tumor or anything to worry about. Compared to previous MRI from 2017 these changes appear to have progressed but this is to be expected. No worrisome finding.Pt verbalized understanding.

## 2019-04-17 ENCOUNTER — Encounter: Payer: Self-pay | Admitting: Neurology

## 2019-04-17 ENCOUNTER — Ambulatory Visit (INDEPENDENT_AMBULATORY_CARE_PROVIDER_SITE_OTHER): Payer: Medicare Other | Admitting: Neurology

## 2019-04-17 ENCOUNTER — Other Ambulatory Visit: Payer: Self-pay

## 2019-04-17 VITALS — BP 180/72 | HR 82 | Temp 97.8°F | Wt 145.0 lb

## 2019-04-17 DIAGNOSIS — R413 Other amnesia: Secondary | ICD-10-CM

## 2019-04-17 DIAGNOSIS — G3184 Mild cognitive impairment, so stated: Secondary | ICD-10-CM | POA: Diagnosis not present

## 2019-04-17 MED ORDER — SERTRALINE HCL 100 MG PO TABS: 100.0000 mg | ORAL_TABLET | Freq: Every day | ORAL | 1 refills | Status: DC

## 2019-04-17 MED ORDER — SERTRALINE HCL 100 MG PO TABS: 150.0000 mg | ORAL_TABLET | Freq: Every day | ORAL | 1 refills | Status: DC

## 2019-04-17 NOTE — Patient Instructions (Addendum)
I had a long discussion with the patient and his ex-wife regarding his cognitive difficulties which likely are related to his severe untreated depression.  I recommend he increase the dose of Zoloft to 150 mg daily and follow-up with his primary care physician Dr. Harrington Challenger for optimal treatment for his depression. I encouraged him to check lab work for reversible causes of memory loss today as well as come back later for an EEG and to increase participation in cognitively challenging activities like solving crossword puzzles, playing bridge and sudoku.  Continue Plavix for stroke prevention with strict control of hypertension with blood pressure goal below 130/90, lipids with LDL cholesterol goal below 70 mg percent and diabetes with hemoglobin A1c goal below 6.5%.  He will return for follow-up in 3 months with my nurse practitioner call earlier if necessary

## 2019-04-17 NOTE — Progress Notes (Addendum)
[facility] [address] [place]. [place] [address] [id number]       OFFICE FOLLOW-UP NOTE  [name] Date of Birth:  1945-11-03 Medical Record Number:  [id number]   HPI:  Virtual video visit 02/19/2019 : Terry Kemp is seen today for virtual video consultation visit for evaluation for new complaints of memory loss.He is a pleasant 73 year old Caucasian male with past medical history of hypertension, hyperlipidemia, coronary artery disease with MI status post stenting in August 2006, peripheral arterial disease status post stenting of her iliac arteries, COPD, tobacco dependence, benign prostatic hypertrophy, osteoarthritis, TIAs and lacunar infarcts in the past.  He states following his last lacunar infarct in 2017 he is noticed memory difficulties which he feels have been slowly progressive.  Patient states that he is having trouble remembering mostly short-term issues.  He is no longer able to care for himself and had to reluctantly moving to live with his ex-wife.  He is having trouble cooking and doing stuff at home.  He tried hiring caregiver but it did not work out.  He can still do a few things around the house for himself and he can look after his own needs but cannot manage house.  He states he gets upset very easily though he has not had any violent outbursts.  He has longstanding history of anxiety depression and is been on multiple medications but feels that is under control.  On inquiry admits to occasional word finding difficulties and trouble completing sentences and having to stop and midsentence.  He denies any headaches, blurred vision, double vision, gait or balance problems.  He has had no recent stroke or TIA symptoms.  He denies any prior history of significant head injury with loss of consciousness or seizures.  He has not had any recent cognitive evaluation or brain imaging study done.  There is no family history of Alzheimer's dementia though  he was adopted he was able to establish contact with his birth mother but he does not know the rest of his family very well Update 04/17/2019 : He returns for follow-up after last visit 2 months ago.  He is accompanied by his ex-wife.  He states he continues to have memory difficulties.  He has not been compliant with his medications and has not been taking them.  He initially said he was not taking his Zoloft but subsequently told me he was taking 100 mg daily.  He has longstanding history of depression and anxiety and was previously seen at [facility] psychiat[facility] and more recently has just followed with primary physician Dr. Harrington Challenger.  He did undergo MRI scan of the brain which had ordered on 03/14/2019 which shows mild changes of small vessel disease and generalized atrophy which appear progressed compared to previous scan but no acute infarct.  I had also ordered lab work for reversible causes of memory loss as well as EEG which have not yet been done.  Patient has no new complaints but states now that he will be willing to take his Zoloft and follow-up with his physician as he wants to get better.  On Mini-Mental status exam today he scored 30/30 but on geriatric depression scale he scored 12 suggestive of moderate to severe depression ROS:   14 system review of systems is positive for memory loss, depression, lack of initiative and all other systems negative  PMH:  Past Medical History:  Diagnosis Date  . Anxiety   . Arthritis   . CAD S/P  percutaneous coronary angioplasty 01/2005   ST elevation MI -CATH/PCI-RCA (Cypher DES 3 x 28 - 3.82mm); CATH 2008 FOR INFERIOR ISCHEMIA - 80% dRCA ISR reduced to 40% with NTG - spasm.  . Depression   . Hyperlipidemia   . Hypertension   . PAD (peripheral artery disease) ([place]) 2000   bilateral iliac arteries PTA  and  stenting in 04/30/1998; In 2006-subsequent  R Common Iliac (RCIA) 100% ISR, 75 % LCIA -- staged Bifuracation Kissing Stent creating new Aortic Carina.--  Overlapping 7 mm x 45mm & 72mm stents RCIA & kissing 7 mm x 29 mm in LCIA.  . Stroke [facility]) 2013; 08/2015   a) Felt to be a TIA;; b) Imaging confirmed CVA - mild residual R-sided weakness    Social History:  Social History   Socioeconomic History  . Marital status: Single    Spouse name: Not on file  . Number of children: 2  . Years of education: college  . Highest education level: Not on file  Occupational History  . Not on file  Social Needs  . Financial resource strain: Not on file  . Food insecurity    Worry: Not on file    Inability: Not on file  . Transportation needs    Medical: Not on file    Non-medical: Not on file  Tobacco Use  . Smoking status: Former Smoker    Packs/day: 1.00    Years: 38.00    Pack years: 38.00    Types: Cigarettes    Quit date: 01/01/2017    Years since quitting: 2.2  . Smokeless tobacco: Never Used  Substance and Sexual Activity  . Alcohol use: Yes    Alcohol/week: 14.0 standard drinks    Types: 14 Glasses of wine per week  . Drug use: No  . Sexual activity: Not on file  Lifestyle  . Physical activity    Days per week: Not on file    Minutes per session: Not on file  . Stress: Not on file  Relationships  . Social Herbalist on phone: Not on file    Gets together: Not on file    Attends religious service: Not on file    Active member of club or organization: Not on file    Attends meetings of clubs or organizations: Not on file    Relationship status: Not on file  . Intimate partner violence    Fear of current or ex partner: Not on file    Emotionally abused: Not on file    Physically abused: Not on file    Forced sexual activity: Not on file  Other Topics Concern  . Not on file  Social History Narrative  . Not on file    Medications:   Current Outpatient Medications on File Prior to Visit  Medication Sig Dispense Refill  . ALPRAZolam (XANAX) 1 MG tablet Take 0.5 mg by mouth as needed for anxiety or sleep.      Marland Kitchen amLODipine (NORVASC) 10 MG tablet TAKE 1 TABLET BY MOUTH ONCE DAILY 90 tablet 2  . atorvastatin (LIPITOR) 40 MG tablet Take 1 tablet (40 mg total) by mouth daily. 90 tablet 0  . clopidogrel (PLAVIX) 75 MG tablet Take 1 tablet by mouth daily 90 tablet 0  . Cyanocobalamin (VITAMIN B-12 CR PO) Take by mouth daily. Reported on 08/26/2015    . metoprolol succinate (TOPROL XL) 25 MG 24 hr tablet Take 1 tablet (25 mg total) by mouth daily. [place]  tablet 3  . Multiple Vitamin (MULTIVITAMIN) tablet Take 1 tablet by mouth daily.    . ramipril (ALTACE) 10 MG capsule Take 1 capsule (10 mg total) by mouth daily. 90 capsule 3   No current facility-administered medications on file prior to visit.     Allergies:  No Known Allergies  Physical Exam General: well developed, well nourished elderly Caucasian male, seated, in no evident distress Head: head normocephalic and atraumatic.  Neck: supple with no carotid or supraclavicular bruits Cardiovascular: regular rate and rhythm, no murmurs Musculoskeletal: no deformity Skin:  no rash/petichiae Vascular:  Normal pulses all extremities Vitals:   04/17/19 1047  BP: (!) 180/72  Pulse: 82  Temp: 97.8 F (36.6 C)   Neurologic Exam Mental Status: Awake and fully alert. Oriented to place and time. Recent and remote memory intact. Attention span, concentration and fund of knowledge appropriate. Mood and affect appear depressed.  Diminished recall.Mini-Mental status exam today he scored 30/30 but on geriatric depression scale he scored 12 suggestive of moderate to severe depression  Cranial Nerves: Fundoscopic exam not done. Pupils equal, briskly reactive to light. Extraocular movements full without nystagmus. Visual fields full to confrontation. Hearing intact. Facial sensation intact. Face, tongue, palate moves normally and symmetrically.  Motor: Normal bulk and tone. Normal strength in all tested extremity muscles. Sensory.: intact to touch ,pinprick .position  and vibratory sensation.  Coordination: Rapid alternating movements normal in all extremities. Finger-to-nose and heel-to-shin performed accurately bilaterally. Gait and Station: Arises from chair without difficulty. Stance is normal. Gait demonstrates normal stride length and balance . Able to heel, toe and tandem walk with moderate difficulty.  Reflexes: 1+ and symmetric. Toes downgoing.       ASSESSMENT:73 year old Caucasian male with past medical history of MRI negative brainstem infarct in 2012, TIA in 2013 and silent left internal capsule infarct and left subcortical infarct in December 2016 from small vessel disease with vascular risk factors of obesity, hypertension, hyperlipidemia and cerebrovascular disease and remote smoking.  He is complaining of memory difficulties following his last stroke which have been slowly progressive likely mild cognitive impairment with underlying suboptimally treated depression     PLAN: I had a long discussion with the patient regarding his progressive memory and cognitive difficulties and discussed differential diagnosis and evaluation and treatment plan and answered questions.  I recommend we check memory panel labs, EEG.  I encouraged him to increase participation in cognitively challenging activities like solving crossword puzzles, playing bridge and sudoku.  I counseled him to be compliant with all his medications and to increase Zoloft to 150 mg daily and to follow-up with his primary care physician Dr. Harrington Challenger for optimal treatment for his depression.  If his cognitive difficulties continue despite optimal depression treatment may consider trial of Aricept at future visit. Continue Plavix for stroke prevention with strict control of hypertension with blood pressure goal below 130/90, lipids with LDL cholesterol goal below 70 mg percent and diabetes with hemoglobin A1c goal below 6.5%.  I also advised him to try to quit using nicotine lozenges that he seems to  be addicted to.    Greater than 50% of time during this 25 minute visit was spent on counseling,explanation of diagnosis, planning of further management, discussion with patient and family and coordination of care Antony Contras, MD  [facility] [address] [place], [place] [address]  Phone [phone] Fax [phone] Note: This document was prepared with digital dictation and possible smart phrase technology. Any transcriptional  errors that result from this process are unintentional

## 2019-04-18 LAB — DEMENTIA PANEL
Homocysteine: 8.5 umol/L (ref 0.0–17.2)
RPR Ser Ql: NONREACTIVE
TSH: 1.4 u[IU]/mL (ref 0.450–4.500)
Vitamin B-12: 543 pg/mL (ref 232–1245)

## 2019-04-21 ENCOUNTER — Telehealth: Payer: Self-pay | Admitting: Neurology

## 2019-04-21 NOTE — Telephone Encounter (Signed)
Questions about Zoloft First couples of days he was fine and then a few days later he  had very very bad diarrhea and he wanted nothing to eat . This is a side effect of the medication ?  Patient has really bad Anxiety. Please call. Should she stop or keep taking ?  She did not give him medication until she here's from you.  [phone]

## 2019-04-21 NOTE — Telephone Encounter (Signed)
Zoloft can sometimes make patients nauseated. Diarrhea is a likely side effect and only last the first 4-8 days, then resolves. Can we agree on AM intake?  I will forward this to Dr. Leonie Man, who is primary neurologist.

## 2019-04-21 NOTE — Telephone Encounter (Signed)
I agree he needs to continue it for at least a week and if diarrhea persists may consider switching to alternative agent.

## 2019-04-22 NOTE — Telephone Encounter (Signed)
I called patients wife Terry Kemp, Terry Kemp to discuss her husbands issues with the zoloft for his depression. She stated pt was having a hard time yesterday and stop taking it. He was not at home and taking a walk this am. I stated per Dr Leonie Man visit and avs it was discuss with her and pt to increase to 150mg  daily and to follow up with PCP for ongoing options for his depression. I stated Dr Leonie Man wants pt to continue for a week to see if diarrhea stops. If it continues may consider switching to alternative agent. The wife stated her husband was feeling down and very depress and took a xanax to sleep. I ask the wife if pt has seen his psychiatrist for his depression. The wife stated due to COVID 19 it was on a video and all they did was talk. I advise wife a Deloris Ping will be sent to pt to discuss Dr Leonie Man recommendation. Also to keep pt safe and if she feels he is a unsafe to himself to call 911. She verbalized understanding.

## 2019-05-14 ENCOUNTER — Other Ambulatory Visit: Payer: Self-pay | Admitting: Cardiology

## 2019-05-23 DIAGNOSIS — F4323 Adjustment disorder with mixed anxiety and depressed mood: Secondary | ICD-10-CM | POA: Diagnosis not present

## 2019-05-29 DIAGNOSIS — Z23 Encounter for immunization: Secondary | ICD-10-CM | POA: Diagnosis not present

## 2019-06-06 DIAGNOSIS — F4323 Adjustment disorder with mixed anxiety and depressed mood: Secondary | ICD-10-CM | POA: Diagnosis not present

## 2019-06-13 DIAGNOSIS — J449 Chronic obstructive pulmonary disease, unspecified: Secondary | ICD-10-CM | POA: Diagnosis not present

## 2019-06-13 DIAGNOSIS — F324 Major depressive disorder, single episode, in partial remission: Secondary | ICD-10-CM | POA: Diagnosis not present

## 2019-06-13 DIAGNOSIS — E7800 Pure hypercholesterolemia, unspecified: Secondary | ICD-10-CM | POA: Diagnosis not present

## 2019-06-13 DIAGNOSIS — I10 Essential (primary) hypertension: Secondary | ICD-10-CM | POA: Diagnosis not present

## 2019-06-13 DIAGNOSIS — I2510 Atherosclerotic heart disease of native coronary artery without angina pectoris: Secondary | ICD-10-CM | POA: Diagnosis not present

## 2019-06-25 ENCOUNTER — Ambulatory Visit: Payer: Medicare Other | Admitting: Neurology

## 2019-06-26 ENCOUNTER — Emergency Department (HOSPITAL_COMMUNITY): Payer: Medicare Other

## 2019-06-26 ENCOUNTER — Other Ambulatory Visit: Payer: Self-pay

## 2019-06-26 ENCOUNTER — Encounter (HOSPITAL_COMMUNITY): Payer: Self-pay

## 2019-06-26 ENCOUNTER — Inpatient Hospital Stay (HOSPITAL_COMMUNITY)
Admission: EM | Admit: 2019-06-26 | Discharge: 2019-07-01 | DRG: 177 | Disposition: A | Discharge status: Home / Self Care | Payer: Medicare Other | Attending: Internal Medicine | Admitting: Internal Medicine

## 2019-06-26 DIAGNOSIS — U071 COVID-19: Secondary | ICD-10-CM | POA: Diagnosis not present

## 2019-06-26 DIAGNOSIS — J1289 Other viral pneumonia: Secondary | ICD-10-CM

## 2019-06-26 DIAGNOSIS — E878 Other disorders of electrolyte and fluid balance, not elsewhere classified: Secondary | ICD-10-CM | POA: Diagnosis present

## 2019-06-26 DIAGNOSIS — J189 Pneumonia, unspecified organism: Secondary | ICD-10-CM | POA: Diagnosis not present

## 2019-06-26 DIAGNOSIS — Z955 Presence of coronary angioplasty implant and graft: Secondary | ICD-10-CM

## 2019-06-26 DIAGNOSIS — R531 Weakness: Secondary | ICD-10-CM | POA: Diagnosis not present

## 2019-06-26 DIAGNOSIS — E876 Hypokalemia: Secondary | ICD-10-CM | POA: Diagnosis present

## 2019-06-26 DIAGNOSIS — E871 Hypo-osmolality and hyponatremia: Secondary | ICD-10-CM | POA: Diagnosis not present

## 2019-06-26 DIAGNOSIS — G92 Toxic encephalopathy: Secondary | ICD-10-CM | POA: Diagnosis not present

## 2019-06-26 DIAGNOSIS — Z66 Do not resuscitate: Secondary | ICD-10-CM | POA: Diagnosis not present

## 2019-06-26 DIAGNOSIS — I739 Peripheral vascular disease, unspecified: Secondary | ICD-10-CM | POA: Diagnosis present

## 2019-06-26 DIAGNOSIS — J9621 Acute and chronic respiratory failure with hypoxia: Secondary | ICD-10-CM | POA: Diagnosis present

## 2019-06-26 DIAGNOSIS — W010XXA Fall on same level from slipping, tripping and stumbling without subsequent striking against object, initial encounter: Secondary | ICD-10-CM | POA: Diagnosis present

## 2019-06-26 DIAGNOSIS — Z7289 Other problems related to lifestyle: Secondary | ICD-10-CM

## 2019-06-26 DIAGNOSIS — Z79899 Other long term (current) drug therapy: Secondary | ICD-10-CM

## 2019-06-26 DIAGNOSIS — I69351 Hemiplegia and hemiparesis following cerebral infarction affecting right dominant side: Secondary | ICD-10-CM | POA: Diagnosis not present

## 2019-06-26 DIAGNOSIS — Z8249 Family history of ischemic heart disease and other diseases of the circulatory system: Secondary | ICD-10-CM

## 2019-06-26 DIAGNOSIS — I2510 Atherosclerotic heart disease of native coronary artery without angina pectoris: Secondary | ICD-10-CM | POA: Diagnosis present

## 2019-06-26 DIAGNOSIS — E860 Dehydration: Secondary | ICD-10-CM | POA: Diagnosis present

## 2019-06-26 DIAGNOSIS — R4182 Altered mental status, unspecified: Secondary | ICD-10-CM

## 2019-06-26 DIAGNOSIS — I252 Old myocardial infarction: Secondary | ICD-10-CM

## 2019-06-26 DIAGNOSIS — E785 Hyperlipidemia, unspecified: Secondary | ICD-10-CM | POA: Diagnosis present

## 2019-06-26 DIAGNOSIS — R4020 Unspecified coma: Secondary | ICD-10-CM | POA: Diagnosis not present

## 2019-06-26 DIAGNOSIS — F329 Major depressive disorder, single episode, unspecified: Secondary | ICD-10-CM | POA: Diagnosis present

## 2019-06-26 DIAGNOSIS — I10 Essential (primary) hypertension: Secondary | ICD-10-CM | POA: Diagnosis present

## 2019-06-26 DIAGNOSIS — R0689 Other abnormalities of breathing: Secondary | ICD-10-CM | POA: Diagnosis not present

## 2019-06-26 DIAGNOSIS — Z7902 Long term (current) use of antithrombotics/antiplatelets: Secondary | ICD-10-CM

## 2019-06-26 DIAGNOSIS — F419 Anxiety disorder, unspecified: Secondary | ICD-10-CM | POA: Diagnosis present

## 2019-06-26 DIAGNOSIS — R0902 Hypoxemia: Secondary | ICD-10-CM | POA: Diagnosis not present

## 2019-06-26 DIAGNOSIS — R410 Disorientation, unspecified: Secondary | ICD-10-CM | POA: Diagnosis not present

## 2019-06-26 DIAGNOSIS — Z87891 Personal history of nicotine dependence: Secondary | ICD-10-CM

## 2019-06-26 DIAGNOSIS — Z95828 Presence of other vascular implants and grafts: Secondary | ICD-10-CM

## 2019-06-26 DIAGNOSIS — Z981 Arthrodesis status: Secondary | ICD-10-CM

## 2019-06-26 DIAGNOSIS — J1282 Pneumonia due to coronavirus disease 2019: Secondary | ICD-10-CM

## 2019-06-26 DIAGNOSIS — R509 Fever, unspecified: Secondary | ICD-10-CM | POA: Diagnosis not present

## 2019-06-26 HISTORY — DX: Acute myocardial infarction, unspecified: I21.9

## 2019-06-26 LAB — COMPREHENSIVE METABOLIC PANEL
ALT: 20 U/L (ref 0–44)
AST: 24 U/L (ref 15–41)
Albumin: 3.7 g/dL (ref 3.5–5.0)
Alkaline Phosphatase: 59 U/L (ref 38–126)
Anion gap: 13 (ref 5–15)
BUN: 13 mg/dL (ref 8–23)
CO2: 19 mmol/L — ABNORMAL LOW (ref 22–32)
Calcium: 8.7 mg/dL — ABNORMAL LOW (ref 8.9–10.3)
Chloride: 89 mmol/L — ABNORMAL LOW (ref 98–111)
Creatinine, Ser: 0.99 mg/dL (ref 0.61–1.24)
GFR calc Af Amer: >60 mL/min (ref >60)
GFR calc non Af Amer: >60 mL/min (ref >60)
Glucose, Bld: 172 mg/dL — ABNORMAL HIGH (ref 70–99)
Potassium: 3.4 mmol/L — ABNORMAL LOW (ref 3.5–5.1)
Sodium: 121 mmol/L — ABNORMAL LOW (ref 135–145)
Total Bilirubin: 0.6 mg/dL (ref 0.3–1.2)
Total Protein: 6.5 g/dL (ref 6.5–8.1)

## 2019-06-26 LAB — CBC WITH DIFFERENTIAL/PLATELET
Abs Immature Granulocytes: 0.05 10*3/uL (ref 0.00–0.07)
Basophils Absolute: 0 10*3/uL (ref 0.0–0.1)
Basophils Relative: 0 %
Eosinophils Absolute: 0 10*3/uL (ref 0.0–0.5)
Eosinophils Relative: 0 %
HCT: 42.1 % (ref 39.0–52.0)
Hemoglobin: 15.1 g/dL (ref 13.0–17.0)
Immature Granulocytes: 1 %
Lymphocytes Relative: 5 %
Lymphs Abs: 0.5 10*3/uL — ABNORMAL LOW (ref 0.7–4.0)
MCH: 31.5 pg (ref 26.0–34.0)
MCHC: 35.9 g/dL (ref 30.0–36.0)
MCV: 87.9 fL (ref 80.0–100.0)
Monocytes Absolute: 1 10*3/uL (ref 0.1–1.0)
Monocytes Relative: 11 %
Neutro Abs: 7.4 10*3/uL (ref 1.7–7.7)
Neutrophils Relative %: 83 %
Platelets: 144 10*3/uL — ABNORMAL LOW (ref 150–400)
RBC: 4.79 MIL/uL (ref 4.22–5.81)
RDW: 11.9 % (ref 11.5–15.5)
WBC: 8.9 10*3/uL (ref 4.0–10.5)
nRBC: 0 % (ref 0.0–0.2)

## 2019-06-26 LAB — LACTIC ACID, PLASMA: Lactic Acid, Venous: 1.1 mmol/L (ref 0.5–1.9)

## 2019-06-26 LAB — PROTIME-INR
INR: 1 (ref 0.8–1.2)
Prothrombin Time: 13.5 seconds (ref 11.4–15.2)

## 2019-06-26 LAB — APTT: aPTT: 33 seconds (ref 24–36)

## 2019-06-26 MED ORDER — SODIUM CHLORIDE 0.9 % IV BOLUS
1000.0000 mL | Freq: Once | INTRAVENOUS | Status: AC
  Administered 2019-06-26: 23:00:00 1000 mL via INTRAVENOUS

## 2019-06-26 MED ORDER — SODIUM CHLORIDE 0.9 % IV SOLN
500.0000 mg | Freq: Once | INTRAVENOUS | Status: AC
  Administered 2019-06-27: 500 mg via INTRAVENOUS
  Filled 2019-06-26: qty 500

## 2019-06-26 MED ORDER — SODIUM CHLORIDE 0.9 % IV SOLN
1.0000 g | Freq: Once | INTRAVENOUS | Status: AC
  Administered 2019-06-27: 1 g via INTRAVENOUS
  Filled 2019-06-26: qty 10

## 2019-06-26 NOTE — ED Provider Notes (Signed)
[facility] EMERGENCY DEPARTMENT Provider Note   CSN: [id number] Arrival date & time: 06/26/19  2107     History   Chief Complaint Chief Complaint  Patient presents with  . Fall  . Altered Mental Status    HPI Terry Kemp is a 73 y.o. male with a history of chronic infarcts with residual left-sided weakness, presented to the emergency department with fall and fever.  Patient reports that he had a fall earlier today.  He cannot recall any circumstances surrounding this.  His wife on the phone tells me that he had a mechanical fall earlier this morning.  He told her in the afternoon while sitting in his arm chair watching TV that he was feeling dizzy and off.  She noted that he was running a fever at home temp 102F.  She denies that he has had any coughing.  Patient denies any headache, neck stiffness, shortness of breath, coughing, abdominal pain, dysuria.     HPI  Past Medical History:  Diagnosis Date  . Anxiety   . Arthritis   . CAD S/P percutaneous coronary angioplasty 01/2005   ST elevation MI -CATH/PCI-RCA (Cypher DES 3 x 28 - 3.17mm); CATH 2008 FOR INFERIOR ISCHEMIA - 80% dRCA ISR reduced to 40% with NTG - spasm.  . Depression   . Hyperlipidemia   . Hypertension   . PAD (peripheral artery disease) ([place]) 2000   bilateral iliac arteries PTA  and  stenting in 04/30/1998; In 2006-subsequent  R Common Iliac (RCIA) 100% ISR, 75 % LCIA -- staged Bifuracation Kissing Stent creating new Aortic Carina.-- Overlapping 7 mm x 16mm & 19mm stents RCIA & kissing 7 mm x 29 mm in LCIA.  . Stroke [facility]) 2013; 08/2015   a) Felt to be a TIA;; b) Imaging confirmed CVA - mild residual R-sided weakness    Patient Active Problem List   Diagnosis Date Noted  . Intentional benzodiazepine overdose ([place])   . Suicide attempt ([place])   . History of stroke 04/03/2017  . Stroke ([place]) 09/15/2015  . Tobacco use disorder 09/15/2015  . Essential hypertension 12/23/2014  . Current  every day smoker - 38 Pk year. 12/23/2014  . Spondylolisthesis at L5-S1 level 11/09/2014  . Hyperlipidemia with target LDL less than 70 11/04/2014  . TIA (transient ischemic attack) 04/22/2014  . Anxiety and depression 04/22/2014  . CAD S/P RCA DES 2006 04/16/2014  . PAD (peripheral artery disease) ([place]) 04/16/2014  . 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 ABDOMINAL AOTROGRAM  07/17/2007   ADDTION WITH CARDIAC CATH---(INFRARENAL WITH BIFEMORAL RUNOFF )----WIDELY PATENT STENTS TO LEFT AND RIGHT COMMON ILIACS  . CARDIAC CATHETERIZATION  01/10/2005   Inf STEMI: dLM 50% - calcified. mRCA 100% (infaract-related vessel) --> DES PCI; AbdAo-Iliac Angio: PAD R Common Iliac (RCIA) 100% ISR, LCIA 75% ISR @ outflow tract of the left iliac. LVEF~ 60%  . CARDIAC CATHETERIZATION  07/17/2007   40% dLM (previously read as 50%); 80% mRCA - distal stent ISR --> reduced to 40% with NTG = due to spasm--widely patent bilaeral common illiacs  . CORONARY ANGIOPLASTY WITH STENT PLACEMENT  01/10/2005   mRCA DES PCI - CYPHER DES 3.0 x 28 mm -> post-dilated to 3.25 mm  . EYE SURGERY Bilateral 2000   cataracts  . lower extremities arterial doppler Bilateral 01/26/2005,09/28/2004   01/26/05-- right ABI -MODERATE,LEFT ABI MILLD -RIGHT CIA STENT OCCLUDED ,RIGHT IIA REVERSIBLE FLOW;LEFT CIA >70%--09/28/2004-  RGT ABI'S 76, LFT ABI'S102  . NM MYOCAR PERF EJECTION FRACTION  06/03/2007   REST/STRESS -- EF 69% MILD INFEROAPICAL ISCHEMIA NOTED - Referred for CATH - RCA PCI  . NM MYOVIEW LTD  11/2014   Low Risk - "Diaphragmatic attenuation", no infarct or ischemia  . peripheral abd aortic cath  04/04/2005   performed by Dr Rollene Fare---  LCIA 75% ISR, 100% RCIA ISR: kissing balloon PTA  --> Kissing STENTs Bilateral CIA (RCIA 2 overlapping 7.0 x 39 mm & 7.0 x 18 mm Gensis stents, LCIA - 23mm x 29 mm Gensis stent (noted to be patent in 2008 Cath)  . peripheral arteries disease   09/1999   status post bilatera iliac stents jan 2001. three stents in the left common iliasc ,1 widely patent stent right common iliac, 2 overlapping stents revealed nio more 10% in-stent restenosis in 2008.   Marland Kitchen SPINAL FUSION  11/2014   SURGERY BY DR Ellene Route  . TRANSTHORACIC ECHOCARDIOGRAM  09/2015   Normal LV size and systolic function, EF 123456. Normal RV size.  No valvular lesions.        Home Medications    Prior to Admission medications   Medication Sig Start Date End Date Taking? Authorizing Provider  ALPRAZolam Duanne Moron) 1 MG tablet Take 0.5 mg by mouth as needed for anxiety or sleep.  02/16/14  Yes [provider]  amLODipine (NORVASC) 10 MG tablet TAKE 1 TABLET BY MOUTH ONCE DAILY Patient taking differently: Take 10 mg by mouth daily.  09/25/18  Yes Leonie Man, MD  atorvastatin (LIPITOR) 40 MG tablet Take 1 tablet (40 mg total) by mouth daily. 03/14/19  Yes Leonie Man, MD  clopidogrel (PLAVIX) 75 MG tablet Take 1 tablet by mouth daily 02/18/19  Yes Leonie Man, MD  Cyanocobalamin (VITAMIN B-12 CR PO) Take by mouth daily. Reported on 08/26/2015   Yes [provider]  metoprolol succinate (TOPROL XL) 25 MG 24 hr tablet Take 1 tablet (25 mg total) by mouth daily. 06/22/17  Yes Leonie Man, MD  Multiple Vitamin (MULTIVITAMIN) tablet Take 1 tablet by mouth daily.   Yes [provider]  ramipril (ALTACE) 10 MG capsule TAKE 1 CAPSULE BY MOUTH DAILY 05/14/19  Yes Leonie Man, MD  sertraline (ZOLOFT) 100 MG tablet Take 1.5 tablets (150 mg total) by mouth daily. 04/17/19  Yes Garvin Fila, MD    Family History Family History  Problem Relation Age of Onset  . Cancer Mother   . Heart disease Maternal Grandfather     Social History Social History   Tobacco Use  . Smoking status: Former Smoker    Packs/day: 1.00    Years: 38.00    Pack years: 38.00    Types: Cigarettes    Quit date: 01/01/2017    Years since quitting: 2.4  .  Smokeless tobacco: Never Used  Substance Use Topics  . Alcohol use: Yes    Alcohol/week: 14.0 standard drinks    Types: 14 Glasses of wine per week  . Drug use: No     Allergies   Patient has no known allergies.   Review of Systems Review of Systems  Constitutional: Negative for chills and fever.  Eyes: Negative for photophobia and visual disturbance.  Respiratory: Negative for cough and shortness of breath.   Cardiovascular: Negative for chest pain and palpitations.  Gastrointestinal: Negative for abdominal pain, nausea and vomiting.  Musculoskeletal: Negative for back pain and myalgias.  Skin: Negative for pallor  and rash.  Neurological: Positive for dizziness. Negative for syncope, light-headedness and headaches.  All other systems reviewed and are negative.    Physical Exam Updated Vital Signs BP (!) 158/86 (BP Location: Right Arm)   Pulse 85   Temp (!) 102.2 F (39 C) (Oral)   Resp 12   Ht 5\' 6"  (1.676 m)   Wt 63.5 kg   SpO2 95%   BMI 22.60 kg/m   Physical Exam Vitals signs and nursing note reviewed.  Constitutional:      Appearance: He is well-developed.  HENT:     Head: Normocephalic and atraumatic.  Eyes:     Conjunctiva/sclera: Conjunctivae normal.  Neck:     Musculoskeletal: Neck supple.  Cardiovascular:     Rate and Rhythm: Normal rate and regular rhythm.     Pulses: Normal pulses.  Pulmonary:     Effort: Pulmonary effort is normal. No respiratory distress.     Comments: Crackles in right mid and lower lung fields 93% on room air Abdominal:     General: There is no distension.     Palpations: Abdomen is soft.     Tenderness: There is no abdominal tenderness.  Skin:    General: Skin is warm and dry.  Neurological:     Mental Status: He is alert.     Comments: Residual left sided arm and leg weakness (baseline per patient) Normal strength on right extremities No other focal neurological deficits      ED Treatments / Results  Labs (all  labs ordered are listed, but only abnormal results are displayed) Labs Reviewed  COMPREHENSIVE METABOLIC PANEL - Abnormal; Notable for the following components:      Result Value   Sodium 121 (*)    Potassium 3.4 (*)    Chloride 89 (*)    CO2 19 (*)    Glucose, Bld 172 (*)    Calcium 8.7 (*)    All other components within normal limits  CBC WITH DIFFERENTIAL/PLATELET - Abnormal; Notable for the following components:   Platelets 144 (*)    Lymphs Abs 0.5 (*)    All other components within normal limits  CULTURE, BLOOD (ROUTINE X 2)  CULTURE, BLOOD (ROUTINE X 2)  URINE CULTURE  SARS CORONAVIRUS 2 (TAT 6-24 HRS)  LACTIC ACID, PLASMA  APTT  PROTIME-INR  LACTIC ACID, PLASMA  URINALYSIS, ROUTINE W REFLEX MICROSCOPIC    EKG None  Radiology Ct Head Wo Contrast  Result Date: 06/26/2019 CLINICAL DATA:  Altered LOC, fever EXAM: CT HEAD WITHOUT CONTRAST TECHNIQUE: Contiguous axial images were obtained from the base of the skull through the vertex without intravenous contrast. COMPARISON:  CT brain 08/16/2015, 09/24/2015, MRI 03/13/2019 FINDINGS: Brain: No hemorrhage or intracranial mass. Chronic left cerebellar lacunar infarct. Chronic lacunar infarcts in the left basal ganglia and white matter. Age indeterminate infarcts in the left thalamus. Atrophy. Mild small vessel ischemic change of the white matter. Stable ventricle size Vascular: No hyperdense vessels.  Carotid vascular calcification Skull: Normal. Negative for fracture or focal lesion. Sinuses/Orbits: Mucosal thickening in the maxillary, ethmoid and frontal sinuses Other: None. IMPRESSION: 1. Age indeterminate hypodensity/possible lacunar infarct left thalamus. 2. Atrophy and mild small vessel ischemic change of the white matter. Multifocal chronic appearing infarcts involving the left basal ganglia and cerebellum. Electronically Signed   By: Donavan Foil M.D.   On: 06/26/2019 22:42   Dg Chest Port 1 View  Result Date: 06/26/2019  CLINICAL DATA:  Fever. Sepsis. EXAM: PORTABLE CHEST 1  VIEW COMPARISON:  11/04/2014 FINDINGS: Patchy right lung base opacity. Minimal left lower lobe atelectasis. Normal heart size and mediastinal contours. No pulmonary edema, pleural effusion, or pneumothorax. No acute osseous abnormalities are seen. IMPRESSION: 1. Patchy right lung base opacity, suspicious for pneumonia in the setting of fever. 2. Minimal left lower lobe atelectasis. Electronically Signed   By: Keith Rake M.D.   On: 06/26/2019 23:30    Procedures .Critical Care Performed by: Wyvonnia Dusky, MD Authorized by: Wyvonnia Dusky, MD   Critical care provider statement:    Critical care time (minutes):  35   Critical care was necessary to treat or prevent imminent or life-threatening deterioration of the following conditions:  Sepsis   Critical care was time spent personally by me on the following activities:  Discussions with consultants, evaluation of patient's response to treatment, examination of patient, ordering and performing treatments and interventions, ordering and review of laboratory studies, ordering and review of radiographic studies, pulse oximetry, re-evaluation of patient's condition, obtaining history from patient or surrogate and review of old charts   (including critical care time)  Medications Ordered in ED Medications  cefTRIAXone (ROCEPHIN) 1 g in sodium chloride 0.9 % 100 mL IVPB (1 g Intravenous New Bag/Given 06/27/19 0004)  azithromycin (ZITHROMAX) 500 mg in sodium chloride 0.9 % 250 mL IVPB (500 mg Intravenous New Bag/Given 06/27/19 0000)  sodium chloride 0.9 % bolus 1,000 mL (1,000 mLs Intravenous New Bag/Given 06/26/19 2318)     Initial Impression / Assessment and Plan / ED Course  I have reviewed the triage vital signs and the nursing notes.  Pertinent labs & imaging results that were available during my care of the patient were reviewed by me and considered in my medical decision making  (see chart for details).  68-year-old gentleman presenting to emergency department with fever with unclear source, episode of lightheadedness earlier today.  Also reportedly had a mechanical fall.  Unclear whether he struck his head.    He states he is not in respiratory distress.  He has no focal findings on abdominal exam.   Sepsis work-up ordered on arrival.  Unclear what the patient's mental status at baseline.  He does have some mild confusion here today, appears that difficulty remembering what happened.  His wife says that she does feel his mental status has been "off" today.  Plan to obtain a stat CT scan of the brain to evaluate for stroke.  He has no photophobia, no neck stiffness, no headache, which collectively lowers my suspicion for bacterial meningitis.  Pending infectious work-up.  Patient is stable.     Clinical Course as of Jun 27 7  Thu Jun 26, 2019  2306 His wife reports patient had a mechanical fall this morning.  He told her that he felt "dizzy" afterwards.  He had diminished appetite at dinner.  Wife took temperature and noted that it was high.   [MT]  2325 I spoke to Dr. Weldon Picking of neurology who feels that the patient's lacunar infarcts are chronic (noted on prior MR imaging).  Does not feel this is consistent with new stroke, and recommends medical workup   [MT]  2325 Chest xray per my interpretation showing RLL infiltrate, awaiting radiology read   [MT]  2348 IMPRESSION: 1. Patchy right lung base opacity, suspicious for pneumonia in the setting of fever. 2. Minimal left lower lobe atelectasis.   [MT]  Fri Jun 27, 2019  0006 Treat and admit for CAP   [MT]  0007 Patient also noted to have hyponatremia and hypochloremia, unclear source - cautious repletion of sodium, will restrict to 1L NS IVF at this time.   [MT]    Clinical Course User Index [MT] Ronrico Dupin, Carola Rhine, MD     Final Clinical Impressions(s) / ED Diagnoses   Final diagnoses:  Community  acquired pneumonia of right lower lobe of lung  Hyponatremia  Confusion    ED Discharge Orders    None       Kaige Whistler, Carola Rhine, MD 06/27/19 [phone]

## 2019-06-26 NOTE — ED Notes (Signed)
[place] pts wife please call with any updates on pt

## 2019-06-26 NOTE — ED Triage Notes (Signed)
Ems reports taht the pt fell this morning, unknown if it was mechanical. Pt presenting as altered and does not remember falling. Ems also reports a fever of 102.2*f. pt has a hxt of stroke with left sided deficits. cbg 150, bp 131/99, hr 80, 96% on 3l. Pt normally ambulates but it was difficult for the pt on scene.

## 2019-06-27 ENCOUNTER — Other Ambulatory Visit: Payer: Self-pay

## 2019-06-27 ENCOUNTER — Encounter (HOSPITAL_COMMUNITY): Payer: Self-pay

## 2019-06-27 DIAGNOSIS — I739 Peripheral vascular disease, unspecified: Secondary | ICD-10-CM | POA: Diagnosis present

## 2019-06-27 DIAGNOSIS — J1289 Other viral pneumonia: Secondary | ICD-10-CM | POA: Diagnosis present

## 2019-06-27 DIAGNOSIS — Z95828 Presence of other vascular implants and grafts: Secondary | ICD-10-CM | POA: Diagnosis not present

## 2019-06-27 DIAGNOSIS — E871 Hypo-osmolality and hyponatremia: Secondary | ICD-10-CM | POA: Diagnosis present

## 2019-06-27 DIAGNOSIS — I69351 Hemiplegia and hemiparesis following cerebral infarction affecting right dominant side: Secondary | ICD-10-CM | POA: Diagnosis not present

## 2019-06-27 DIAGNOSIS — Z8249 Family history of ischemic heart disease and other diseases of the circulatory system: Secondary | ICD-10-CM | POA: Diagnosis not present

## 2019-06-27 DIAGNOSIS — W010XXA Fall on same level from slipping, tripping and stumbling without subsequent striking against object, initial encounter: Secondary | ICD-10-CM | POA: Diagnosis present

## 2019-06-27 DIAGNOSIS — E785 Hyperlipidemia, unspecified: Secondary | ICD-10-CM | POA: Diagnosis present

## 2019-06-27 DIAGNOSIS — R4182 Altered mental status, unspecified: Secondary | ICD-10-CM

## 2019-06-27 DIAGNOSIS — Z981 Arthrodesis status: Secondary | ICD-10-CM | POA: Diagnosis not present

## 2019-06-27 DIAGNOSIS — J9621 Acute and chronic respiratory failure with hypoxia: Secondary | ICD-10-CM

## 2019-06-27 DIAGNOSIS — G92 Toxic encephalopathy: Secondary | ICD-10-CM | POA: Diagnosis present

## 2019-06-27 DIAGNOSIS — I2510 Atherosclerotic heart disease of native coronary artery without angina pectoris: Secondary | ICD-10-CM | POA: Diagnosis present

## 2019-06-27 DIAGNOSIS — E860 Dehydration: Secondary | ICD-10-CM | POA: Diagnosis present

## 2019-06-27 DIAGNOSIS — U071 COVID-19: Principal | ICD-10-CM

## 2019-06-27 DIAGNOSIS — E876 Hypokalemia: Secondary | ICD-10-CM | POA: Diagnosis present

## 2019-06-27 DIAGNOSIS — R410 Disorientation, unspecified: Secondary | ICD-10-CM | POA: Diagnosis not present

## 2019-06-27 DIAGNOSIS — I10 Essential (primary) hypertension: Secondary | ICD-10-CM | POA: Diagnosis present

## 2019-06-27 DIAGNOSIS — Z7902 Long term (current) use of antithrombotics/antiplatelets: Secondary | ICD-10-CM | POA: Diagnosis not present

## 2019-06-27 DIAGNOSIS — I252 Old myocardial infarction: Secondary | ICD-10-CM | POA: Diagnosis not present

## 2019-06-27 DIAGNOSIS — F329 Major depressive disorder, single episode, unspecified: Secondary | ICD-10-CM | POA: Diagnosis present

## 2019-06-27 DIAGNOSIS — Z7289 Other problems related to lifestyle: Secondary | ICD-10-CM | POA: Diagnosis not present

## 2019-06-27 DIAGNOSIS — Z955 Presence of coronary angioplasty implant and graft: Secondary | ICD-10-CM | POA: Diagnosis not present

## 2019-06-27 DIAGNOSIS — F419 Anxiety disorder, unspecified: Secondary | ICD-10-CM | POA: Diagnosis present

## 2019-06-27 DIAGNOSIS — E878 Other disorders of electrolyte and fluid balance, not elsewhere classified: Secondary | ICD-10-CM | POA: Diagnosis present

## 2019-06-27 DIAGNOSIS — Z79899 Other long term (current) drug therapy: Secondary | ICD-10-CM | POA: Diagnosis not present

## 2019-06-27 DIAGNOSIS — Z66 Do not resuscitate: Secondary | ICD-10-CM | POA: Diagnosis present

## 2019-06-27 LAB — BASIC METABOLIC PANEL
Anion gap: 12 (ref 5–15)
Anion gap: 10 (ref 5–15)
Anion gap: 9 (ref 5–15)
BUN: 11 mg/dL (ref 8–23)
BUN: 8 mg/dL (ref 8–23)
BUN: 16 mg/dL (ref 8–23)
CO2: 19 mmol/L — ABNORMAL LOW (ref 22–32)
CO2: 20 mmol/L — ABNORMAL LOW (ref 22–32)
CO2: 22 mmol/L (ref 22–32)
Calcium: 8.5 mg/dL — ABNORMAL LOW (ref 8.9–10.3)
Calcium: 8 mg/dL — ABNORMAL LOW (ref 8.9–10.3)
Calcium: 8.7 mg/dL — ABNORMAL LOW (ref 8.9–10.3)
Chloride: 95 mmol/L — ABNORMAL LOW (ref 98–111)
Chloride: 105 mmol/L (ref 98–111)
Chloride: 101 mmol/L (ref 98–111)
Creatinine, Ser: 0.99 mg/dL (ref 0.61–1.24)
Creatinine, Ser: 0.71 mg/dL (ref 0.61–1.24)
Creatinine, Ser: 0.75 mg/dL (ref 0.61–1.24)
GFR calc Af Amer: >60 mL/min (ref >60)
GFR calc Af Amer: >60 mL/min (ref >60)
GFR calc Af Amer: >60 mL/min (ref >60)
GFR calc non Af Amer: >60 mL/min (ref >60)
GFR calc non Af Amer: >60 mL/min (ref >60)
GFR calc non Af Amer: >60 mL/min (ref >60)
Glucose, Bld: 109 mg/dL — ABNORMAL HIGH (ref 70–99)
Glucose, Bld: 113 mg/dL — ABNORMAL HIGH (ref 70–99)
Glucose, Bld: 209 mg/dL — ABNORMAL HIGH (ref 70–99)
Potassium: 3.8 mmol/L (ref 3.5–5.1)
Potassium: 3.5 mmol/L (ref 3.5–5.1)
Potassium: 4.1 mmol/L (ref 3.5–5.1)
Sodium: 126 mmol/L — ABNORMAL LOW (ref 135–145)
Sodium: 135 mmol/L (ref 135–145)
Sodium: 132 mmol/L — ABNORMAL LOW (ref 135–145)

## 2019-06-27 LAB — FERRITIN: Ferritin: 172 ng/mL (ref 24–336)

## 2019-06-27 LAB — FIBRINOGEN: Fibrinogen: 397 mg/dL (ref 210–475)

## 2019-06-27 LAB — TROPONIN I (HIGH SENSITIVITY): Troponin I (High Sensitivity): 12 ng/L (ref <18)

## 2019-06-27 LAB — LACTATE DEHYDROGENASE: LDH: 168 U/L (ref 98–192)

## 2019-06-27 LAB — URINALYSIS, ROUTINE W REFLEX MICROSCOPIC
Bacteria, UA: NONE SEEN
Bilirubin Urine: NEGATIVE
Glucose, UA: NEGATIVE mg/dL
Ketones, ur: 5 mg/dL — AB
Leukocytes,Ua: NEGATIVE
Nitrite: NEGATIVE
Protein, ur: NEGATIVE mg/dL
Specific Gravity, Urine: 1.011 (ref 1.005–1.030)
pH: 6 (ref 5.0–8.0)

## 2019-06-27 LAB — PROCALCITONIN: Procalcitonin: 2.12 ng/mL

## 2019-06-27 LAB — LACTIC ACID, PLASMA: Lactic Acid, Venous: 1.1 mmol/L (ref 0.5–1.9)

## 2019-06-27 LAB — D-DIMER, QUANTITATIVE: D-Dimer, Quant: 2.58 ug/mL-FEU — ABNORMAL HIGH (ref 0.00–0.50)

## 2019-06-27 LAB — OSMOLALITY: Osmolality: 262 mOsm/kg — ABNORMAL LOW (ref 275–295)

## 2019-06-27 LAB — SARS CORONAVIRUS 2 (TAT 6-24 HRS): SARS Coronavirus 2: POSITIVE — AB

## 2019-06-27 LAB — HIV ANTIBODY (ROUTINE TESTING W REFLEX): HIV Screen 4th Generation wRfx: NONREACTIVE

## 2019-06-27 MED ORDER — RAMIPRIL 10 MG PO CAPS
10.0000 mg | ORAL_CAPSULE | Freq: Every day | ORAL | Status: DC
  Filled 2019-06-27: qty 1

## 2019-06-27 MED ORDER — PHENOL 1.4 % MT LIQD
1.0000 | OROMUCOSAL | Status: DC | PRN
  Filled 2019-06-27: qty 177

## 2019-06-27 MED ORDER — ACETAMINOPHEN 325 MG PO TABS
650.0000 mg | ORAL_TABLET | Freq: Four times a day (QID) | ORAL | Status: DC | PRN
  Administered 2019-06-27: 650 mg via ORAL
  Filled 2019-06-27: qty 2

## 2019-06-27 MED ORDER — ACETAMINOPHEN 650 MG RE SUPP: 650.0000 mg | Freq: Four times a day (QID) | RECTAL | Status: DC | PRN

## 2019-06-27 MED ORDER — ALPRAZOLAM 0.5 MG PO TABS
0.5000 mg | ORAL_TABLET | Freq: Every evening | ORAL | Status: DC | PRN
  Administered 2019-06-30: 0.5 mg via ORAL
  Filled 2019-06-27: qty 1

## 2019-06-27 MED ORDER — ADULT MULTIVITAMIN W/MINERALS CH
1.0000 | ORAL_TABLET | Freq: Every day | ORAL | Status: DC
  Administered 2019-06-27 – 2019-07-01 (×5): 1 via ORAL
  Filled 2019-06-27 (×5): qty 1

## 2019-06-27 MED ORDER — FOLIC ACID 1 MG PO TABS
1.0000 mg | ORAL_TABLET | Freq: Every day | ORAL | Status: DC
  Administered 2019-06-27 – 2019-07-01 (×5): 1 mg via ORAL
  Filled 2019-06-27 (×5): qty 1

## 2019-06-27 MED ORDER — LORAZEPAM 2 MG/ML IJ SOLN
1.0000 mg | INTRAMUSCULAR | Status: AC | PRN
  Administered 2019-06-27: 2 mg via INTRAVENOUS
  Administered 2019-06-28 – 2019-06-29 (×2): 1 mg via INTRAVENOUS
  Filled 2019-06-27 (×3): qty 1

## 2019-06-27 MED ORDER — SODIUM CHLORIDE 0.9 % IV SOLN
1.0000 g | INTRAVENOUS | Status: DC
  Administered 2019-06-27: 21:00:00 1 g via INTRAVENOUS
  Filled 2019-06-27: qty 10

## 2019-06-27 MED ORDER — VITAMIN B-1 100 MG PO TABS
100.0000 mg | ORAL_TABLET | Freq: Every day | ORAL | Status: DC
  Administered 2019-06-27 – 2019-07-01 (×5): 100 mg via ORAL
  Filled 2019-06-27 (×5): qty 1

## 2019-06-27 MED ORDER — SERTRALINE HCL 50 MG PO TABS
150.0000 mg | ORAL_TABLET | Freq: Every day | ORAL | Status: DC
  Filled 2019-06-27: qty 1
  Filled 2019-06-27: qty 3

## 2019-06-27 MED ORDER — MENTHOL 3 MG MT LOZG
1.0000 | LOZENGE | OROMUCOSAL | Status: DC | PRN
  Administered 2019-06-27: 3 mg via ORAL
  Filled 2019-06-27: qty 9

## 2019-06-27 MED ORDER — SODIUM CHLORIDE 0.9 % IV SOLN
500.0000 mg | INTRAVENOUS | Status: DC
  Administered 2019-06-27: 500 mg via INTRAVENOUS
  Filled 2019-06-27: qty 500

## 2019-06-27 MED ORDER — AMLODIPINE BESYLATE 10 MG PO TABS
10.0000 mg | ORAL_TABLET | Freq: Every day | ORAL | Status: DC
  Administered 2019-06-27 – 2019-07-01 (×5): 10 mg via ORAL
  Filled 2019-06-27 (×5): qty 1

## 2019-06-27 MED ORDER — ENOXAPARIN SODIUM 40 MG/0.4ML ~~LOC~~ SOLN
40.0000 mg | Freq: Every day | SUBCUTANEOUS | Status: DC
  Administered 2019-06-27 – 2019-07-01 (×5): 40 mg via SUBCUTANEOUS
  Filled 2019-06-27 (×5): qty 0.4

## 2019-06-27 MED ORDER — METOPROLOL SUCCINATE ER 25 MG PO TB24
25.0000 mg | ORAL_TABLET | Freq: Every day | ORAL | Status: DC
  Administered 2019-06-27 – 2019-07-01 (×5): 25 mg via ORAL
  Filled 2019-06-27 (×5): qty 1

## 2019-06-27 MED ORDER — SODIUM CHLORIDE 0.9 % IV SOLN
100.0000 mg | INTRAVENOUS | Status: DC
  Administered 2019-06-28 – 2019-06-30 (×3): 100 mg via INTRAVENOUS
  Filled 2019-06-27 (×4): qty 20

## 2019-06-27 MED ORDER — ATORVASTATIN CALCIUM 40 MG PO TABS
40.0000 mg | ORAL_TABLET | Freq: Every day | ORAL | Status: DC
  Administered 2019-06-27 – 2019-07-01 (×5): 40 mg via ORAL
  Filled 2019-06-27 (×5): qty 1

## 2019-06-27 MED ORDER — LORAZEPAM 1 MG PO TABS: 1.0000 mg | ORAL_TABLET | ORAL | Status: AC | PRN

## 2019-06-27 MED ORDER — CLOPIDOGREL BISULFATE 75 MG PO TABS
75.0000 mg | ORAL_TABLET | Freq: Every day | ORAL | Status: DC
  Administered 2019-06-27 – 2019-07-01 (×5): 75 mg via ORAL
  Filled 2019-06-27 (×5): qty 1

## 2019-06-27 MED ORDER — THIAMINE HCL 100 MG/ML IJ SOLN
100.0000 mg | Freq: Every day | INTRAMUSCULAR | Status: DC
  Filled 2019-06-27 (×5): qty 1

## 2019-06-27 MED ORDER — SODIUM CHLORIDE 0.9 % IV SOLN
200.0000 mg | Freq: Once | INTRAVENOUS | Status: AC
  Administered 2019-06-27: 06:00:00 200 mg via INTRAVENOUS
  Filled 2019-06-27: qty 40

## 2019-06-27 MED ORDER — SODIUM CHLORIDE 0.9 % IV SOLN
INTRAVENOUS | Status: DC
  Administered 2019-06-27: 05:00:00 via INTRAVENOUS

## 2019-06-27 MED ORDER — DEXAMETHASONE SODIUM PHOSPHATE 10 MG/ML IJ SOLN
6.0000 mg | INTRAMUSCULAR | Status: DC
  Administered 2019-06-27 – 2019-07-01 (×5): 6 mg via INTRAVENOUS
  Filled 2019-06-27 (×5): qty 1

## 2019-06-27 MED ORDER — DM-GUAIFENESIN ER 30-600 MG PO TB12
1.0000 | ORAL_TABLET | Freq: Two times a day (BID) | ORAL | Status: DC
  Administered 2019-06-27 – 2019-07-01 (×8): 1 via ORAL
  Filled 2019-06-27 (×8): qty 1

## 2019-06-27 NOTE — ED Notes (Signed)
No call-back from [facility] RN also no missed calls. Was advised by my Agricultural consultant to contact [employer].  On third attempt report given to [name] at [employer].

## 2019-06-27 NOTE — ED Notes (Signed)
[facility] nurse called to get report around 9AM phone was breaking up and hard to hear. I informed [place] nurse our RN was on phone and tried to give her direct number but got disconnected

## 2019-06-27 NOTE — Progress Notes (Addendum)
Patient ID: Terry Kemp, male   DOB: 1946-01-19, 73 y.o.   MRN: [id number] Patient was admitted early this morning for COVID-19 pneumonia and hyponatremia. I have reviewed patient's medical records including this morning's H&P, current labs and medications.  Patient seen at bedside and plan of care discussed with him.  Continue current treatment including iv fluids. Monitor labs. Transfer to [facility] once bed is available.

## 2019-06-27 NOTE — ED Notes (Signed)
Tele   Breakfast ordered  

## 2019-06-27 NOTE — ED Notes (Signed)
Pt states he drinks 2 glasses of wine a day.

## 2019-06-27 NOTE — ED Provider Notes (Signed)
I called report to the hospitalist Dr. Marlowe Sax.  Patient admitted for pneumonia as well as hyponatremia   Ripley Fraise, MD 06/27/19 [phone]

## 2019-06-27 NOTE — ED Notes (Signed)
Lunch Tray Ordered @ 1143. 

## 2019-06-27 NOTE — ED Notes (Signed)
[facility] called for transport to [facility] 

## 2019-06-27 NOTE — ED Notes (Signed)
Attempted report to [facility] RN x2 RN still unable to take report Informed [place] had been called.

## 2019-06-27 NOTE — Progress Notes (Signed)
1610: Patient BP elevated. Did not receive BP meds today. MD made aware. Patient also asking for chloraseptic and PRN anxiety med for sleep.  1638: Patient updated family. No need for this nurse to call.

## 2019-06-27 NOTE — ED Notes (Signed)
Family updated.

## 2019-06-27 NOTE — Plan of Care (Signed)

## 2019-06-27 NOTE — ED Notes (Signed)
Pt has breakfast tray at bedside.  

## 2019-06-27 NOTE — H&P (Addendum)
History and Physical    QUAMIR MALIS [id number] DOB: 1946/08/02 DOA: 06/26/2019  PCP: Lawerance Cruel, MD Patient coming from: Home  Chief Complaint: Fever, fall, altered mental status  HPI: Terry Kemp is a 73 y.o. male with medical history significant of anxiety, arthritis, CAD status post PCI, depression, hypertension, hyperlipidemia, PAD, CVA with mild residual right-sided weakness presenting to the ED for evaluation of fever, fall, and altered mental status. Patient states he has not been feeling well.  He is having shortness of breath and cough but is not sure for how long.  Yesterday when he was trying to take out his heavy trash can, he lost his balance and fell.  He was feeling very weak.  He did not lose consciousness.  Denies chest pain.  Denies nausea, vomiting, abdominal pain, diarrhea.  Per ED provider documentation, wife reported fever of 102 F at home.  ED Course: Temperature 102.2 F.  SARS-CoV-2 test pending.  Hemodynamically stable.  No leukocytosis.  Lactic acid x2 normal.  Absolute lymphocyte count low.  Corrected sodium 122.  Potassium 3.4.  LFTs normal.  Blood culture x2 pending.  UA not suggestive of infection.  Chest x-ray showing patchy right lung base opacity suspicious for pneumonia.  Head CT showing age indeterminate hypodensity/possible lacunar infarct in the left thalamus.  ED provider spoke to Dr. Rory Percy from neurology who reviewed the scan and felt that the lacunar infarct is chronic and not consistent with a new stroke. Patient received ceftriaxone, azithromycin, and 1 L fluid bolus in the ED.  Review of Systems:  All systems reviewed and apart from history of presenting illness, are negative.  Past Medical History:  Diagnosis Date  . Anxiety   . Arthritis   . CAD S/P percutaneous coronary angioplasty 01/2005   ST elevation MI -CATH/PCI-RCA (Cypher DES 3 x 28 - 3.52mm); CATH 2008 FOR INFERIOR ISCHEMIA - 80% dRCA ISR reduced to 40% with NTG -  spasm.  . Depression   . Hyperlipidemia   . Hypertension   . PAD (peripheral artery disease) ([place]) 2000   bilateral iliac arteries PTA  and  stenting in 04/30/1998; In 2006-subsequent  R Common Iliac (RCIA) 100% ISR, 75 % LCIA -- staged Bifuracation Kissing Stent creating new Aortic Carina.-- Overlapping 7 mm x 70mm & 8mm stents RCIA & kissing 7 mm x 29 mm in LCIA.  . Stroke [facility]) 2013; 08/2015   a) Felt to be a TIA;; b) Imaging confirmed CVA - mild residual R-sided weakness    Past Surgical History:  Procedure Laterality Date  . ABDOMINAL AOTROGRAM  07/17/2007   ADDTION WITH CARDIAC CATH---(INFRARENAL WITH BIFEMORAL RUNOFF )----WIDELY PATENT STENTS TO LEFT AND RIGHT COMMON ILIACS  . CARDIAC CATHETERIZATION  01/10/2005   Inf STEMI: dLM 50% - calcified. mRCA 100% (infaract-related vessel) --> DES PCI; AbdAo-Iliac Angio: PAD R Common Iliac (RCIA) 100% ISR, LCIA 75% ISR @ outflow tract of the left iliac. LVEF~ 60%  . CARDIAC CATHETERIZATION  07/17/2007   40% dLM (previously read as 50%); 80% mRCA - distal stent ISR --> reduced to 40% with NTG = due to spasm--widely patent bilaeral common illiacs  . CORONARY ANGIOPLASTY WITH STENT PLACEMENT  01/10/2005   mRCA DES PCI - CYPHER DES 3.0 x 28 mm -> post-dilated to 3.25 mm  . EYE SURGERY Bilateral 2000   cataracts  . lower extremities arterial doppler Bilateral 01/26/2005,09/28/2004   01/26/05-- right ABI -MODERATE,LEFT ABI MILLD -RIGHT CIA STENT OCCLUDED ,RIGHT IIA REVERSIBLE FLOW;LEFT  CIA >70%--09/28/2004- RGT ABI'S 76, LFT ABI'S102  . NM MYOCAR PERF EJECTION FRACTION  06/03/2007   REST/STRESS -- EF 69% MILD INFEROAPICAL ISCHEMIA NOTED - Referred for CATH - RCA PCI  . NM MYOVIEW LTD  11/2014   Low Risk - "Diaphragmatic attenuation", no infarct or ischemia  . peripheral abd aortic cath  04/04/2005   performed by Dr Rollene Fare---  LCIA 75% ISR, 100% RCIA ISR: kissing balloon PTA  --> Kissing STENTs Bilateral CIA (RCIA 2 overlapping 7.0 x 39 mm & 7.0  x 18 mm Gensis stents, LCIA - 42mm x 29 mm Gensis stent (noted to be patent in 2008 Cath)  . peripheral arteries disease  09/1999   status post bilatera iliac stents jan 2001. three stents in the left common iliasc ,1 widely patent stent right common iliac, 2 overlapping stents revealed nio more 10% in-stent restenosis in 2008.   Marland Kitchen SPINAL FUSION  11/2014   SURGERY BY DR Ellene Route  . TRANSTHORACIC ECHOCARDIOGRAM  09/2015   Normal LV size and systolic function, EF 123456. Normal RV size.  No valvular lesions.     reports that he quit smoking about 2 years ago. His smoking use included cigarettes. He has a 38.00 pack-year smoking history. He has never used smokeless tobacco. He reports current alcohol use of about 14.0 standard drinks of alcohol per week. He reports that he does not use drugs.  No Known Allergies  Family History  Problem Relation Age of Onset  . Cancer Mother   . Heart disease Maternal Grandfather     Prior to Admission medications   Medication Sig Start Date End Date Taking? Authorizing Provider  ALPRAZolam Duanne Moron) 1 MG tablet Take 0.5 mg by mouth as needed for anxiety or sleep.  02/16/14  Yes [provider]  amLODipine (NORVASC) 10 MG tablet TAKE 1 TABLET BY MOUTH ONCE DAILY Patient taking differently: Take 10 mg by mouth daily.  09/25/18  Yes Leonie Man, MD  atorvastatin (LIPITOR) 40 MG tablet Take 1 tablet (40 mg total) by mouth daily. 03/14/19  Yes Leonie Man, MD  clopidogrel (PLAVIX) 75 MG tablet Take 1 tablet by mouth daily 02/18/19  Yes Leonie Man, MD  Cyanocobalamin (VITAMIN B-12 CR PO) Take by mouth daily. Reported on 08/26/2015   Yes [provider]  metoprolol succinate (TOPROL XL) 25 MG 24 hr tablet Take 1 tablet (25 mg total) by mouth daily. 06/22/17  Yes Leonie Man, MD  Multiple Vitamin (MULTIVITAMIN) tablet Take 1 tablet by mouth daily.   Yes [provider]  ramipril (ALTACE) 10 MG capsule TAKE 1 CAPSULE BY MOUTH  DAILY 05/14/19  Yes Leonie Man, MD  sertraline (ZOLOFT) 100 MG tablet Take 1.5 tablets (150 mg total) by mouth daily. 04/17/19  Yes Garvin Fila, MD    Physical Exam: Vitals:   06/27/19 0400 06/27/19 0415 06/27/19 0445 06/27/19 0500  BP: (!) 158/86 (!) 155/85  139/81  Pulse: 70 65  71  Resp: 13 11  12   Temp:    99.5 F (37.5 C)  TempSrc:    Oral  SpO2: 100% 99% 92% 96%  Weight:      Height:        Physical Exam  Constitutional: He is oriented to person, place, and time. He appears well-developed and well-nourished.  Very lethargic and ill-appearing  HENT:  Head: Normocephalic.  Eyes: Right eye exhibits no discharge. Left eye exhibits no discharge.  Neck: Neck supple.  Cardiovascular:  Normal rate, regular rhythm and intact distal pulses.  Pulmonary/Chest: Effort normal. No respiratory distress. He has no wheezes. He has no rales.  Abdominal: Soft. Bowel sounds are normal. He exhibits no distension. There is no abdominal tenderness. There is no guarding.  Musculoskeletal:        General: No edema.  Neurological: He is alert and oriented to person, place, and time.  Slow to respond to questions  Skin: Skin is warm and dry. He is not diaphoretic.     Labs on Admission: I have personally reviewed following labs and imaging studies  CBC: Recent Labs  Lab 06/26/19 2234  WBC 8.9  NEUTROABS 7.4  HGB 15.1  HCT 42.1  MCV 87.9  PLT 123456*   Basic Metabolic Panel: Recent Labs  Lab 06/26/19 2234 06/27/19 0330  NA 121* 126*  K 3.4* 3.8  CL 89* 95*  CO2 19* 19*  GLUCOSE 172* 109*  BUN 13 11  CREATININE 0.99 0.99  CALCIUM 8.7* 8.5*   GFR: Estimated Creatinine Clearance: 62.4 mL/min (by C-G formula based on SCr of 0.99 mg/dL). Liver Function Tests: Recent Labs  Lab 06/26/19 2234  AST 24  ALT 20  ALKPHOS 59  BILITOT 0.6  PROT 6.5  ALBUMIN 3.7   No results for input(s): LIPASE, AMYLASE in the last 168 hours. No results for input(s): AMMONIA in the last 168  hours. Coagulation Profile: Recent Labs  Lab 06/26/19 2234  INR 1.0   Cardiac Enzymes: No results for input(s): CKTOTAL, CKMB, CKMBINDEX, TROPONINI in the last 168 hours. BNP (last 3 results) No results for input(s): PROBNP in the last 8760 hours. HbA1C: No results for input(s): HGBA1C in the last 72 hours. CBG: No results for input(s): GLUCAP in the last 168 hours. Lipid Profile: No results for input(s): CHOL, HDL, LDLCALC, TRIG, CHOLHDL, LDLDIRECT in the last 72 hours. Thyroid Function Tests: No results for input(s): TSH, T4TOTAL, FREET4, T3FREE, THYROIDAB in the last 72 hours. Anemia Panel: No results for input(s): VITAMINB12, FOLATE, FERRITIN, TIBC, IRON, RETICCTPCT in the last 72 hours. Urine analysis:    Component Value Date/Time   COLORURINE YELLOW 06/27/2019 0148   APPEARANCEUR CLEAR 06/27/2019 0148   LABSPEC 1.011 06/27/2019 0148   PHURINE 6.0 06/27/2019 0148   GLUCOSEU NEGATIVE 06/27/2019 0148   HGBUR MODERATE (A) 06/27/2019 0148   BILIRUBINUR NEGATIVE 06/27/2019 0148   KETONESUR 5 (A) 06/27/2019 0148   PROTEINUR NEGATIVE 06/27/2019 0148   NITRITE NEGATIVE 06/27/2019 0148   LEUKOCYTESUR NEGATIVE 06/27/2019 0148    Radiological Exams on Admission: Ct Head Wo Contrast  Result Date: 06/26/2019 CLINICAL DATA:  Altered LOC, fever EXAM: CT HEAD WITHOUT CONTRAST TECHNIQUE: Contiguous axial images were obtained from the base of the skull through the vertex without intravenous contrast. COMPARISON:  CT brain 08/16/2015, 09/24/2015, MRI 03/13/2019 FINDINGS: Brain: No hemorrhage or intracranial mass. Chronic left cerebellar lacunar infarct. Chronic lacunar infarcts in the left basal ganglia and white matter. Age indeterminate infarcts in the left thalamus. Atrophy. Mild small vessel ischemic change of the white matter. Stable ventricle size Vascular: No hyperdense vessels.  Carotid vascular calcification Skull: Normal. Negative for fracture or focal lesion. Sinuses/Orbits:  Mucosal thickening in the maxillary, ethmoid and frontal sinuses Other: None. IMPRESSION: 1. Age indeterminate hypodensity/possible lacunar infarct left thalamus. 2. Atrophy and mild small vessel ischemic change of the white matter. Multifocal chronic appearing infarcts involving the left basal ganglia and cerebellum. Electronically Signed   By: Donavan Foil M.D.   On: 06/26/2019 22:42  Dg Chest Port 1 View  Result Date: 06/26/2019 CLINICAL DATA:  Fever. Sepsis. EXAM: PORTABLE CHEST 1 VIEW COMPARISON:  11/04/2014 FINDINGS: Patchy right lung base opacity. Minimal left lower lobe atelectasis. Normal heart size and mediastinal contours. No pulmonary edema, pleural effusion, or pneumothorax. No acute osseous abnormalities are seen. IMPRESSION: 1. Patchy right lung base opacity, suspicious for pneumonia in the setting of fever. 2. Minimal left lower lobe atelectasis. Electronically Signed   By: Keith Rake M.D.   On: 06/26/2019 23:30    EKG: Independently reviewed.  Sinus rhythm, minimal diffuse ST depressions.  Assessment/Plan Principal Problem:   Pneumonia due to COVID-19 virus Active Problems:   Acute on chronic respiratory failure with hypoxia (HCC)   Hyponatremia   Hypokalemia   AMS (altered mental status)   Acute hypoxic respiratory failure secondary to COVID-19 viral pneumonia SARS-CoV-2 test positive. Temperature 102.2 F.  No leukocytosis.  Absolute lymphocyte count low.  Lactic acid x2 normal.  No signs of sepsis. Chest x-ray showing patchy right lung base opacity suspicious for pneumonia.  Oxygen saturation 91 to 93% on room air, currently requiring 2 L supplemental oxygen. -IV Decadron 6 mg daily -Remdesivir -Received ceftriaxone and azithromycin.  Check procalcitonin level.  If not elevated, discontinue antibiotics. -Check inflammatory markers now and monitor daily.  Monitor CBC and CMP daily. -Vitamin C and zinc -Airborne and contact precautions -Continuous pulse ox  -Supplemental oxygen -Blood culture x2  Hyponatremia Corrected sodium 122.  Unclear etiology.  No vomiting or diarrhea to suggest GI loss.  Not on a diuretic.  Currently AAO x3.  No seizures.  Received 1 L normal saline bolus in the ED. Repeat sodium 126. -Continue normal saline at 75 cc/h.  Monitor BMP every 4 hours and adjust rate of fluids accordingly.  Avoid overcorrection. -Check serum osmolarity  Mild hypokalemia Potassium 3.4. -Replete potassium.  Check magnesium level and replete if low.  Continue to monitor electrolytes.  Altered mental status Likely related to acute viral illness and hyponatremia.  Currently AAO x3 but slightly slow to respond to questions.  No focal neuro deficit.  Neurology feels the findings seen on his head CT represent chronic lacunar infarct and not consistent with a new stroke. -Continue to monitor  Physical deconditioning -PT evaluation  History of CAD status post PCI EKG with minimal diffuse ST depressions.  Patient denies chest pain. -Check high-sensitivity troponin level -Cardiac monitoring  Alcohol use No signs of withdrawal at this time. -CIWA protocol; Ativan as needed -Thiamine, folate, multivitamin  DVT prophylaxis: Lovenox Code Status: Patient wishes to be DNR/DNI. Family Communication: No family available. Disposition Plan: Anticipate discharge after clinical improvement. Consults called: None Admission status: It is my clinical opinion that admission to INPATIENT is reasonable and necessary in this 73 y.o. male . presenting with acute hypoxic respiratory failure secondary to COVID-19 viral pneumonia.  High risk of decompensation.  Given the aforementioned, the predictability of an adverse outcome is felt to be significant. I expect that the patient will require at least 2 midnights in the hospital to treat this condition.   The medical decision making on this patient was of high complexity and the patient is at high risk for  clinical deterioration, therefore this is a level 3 visit.  Shela Leff MD [facility] Pager [phone]  If 7PM-7AM, please contact night-coverage [email or web address] Password [email or web address]  06/27/2019, 5:14 AM

## 2019-06-27 NOTE — Progress Notes (Signed)
Pt c/o insomnia and requested Xanax 1-2 mg PO is what pt requested and said takes at home. Ordered is Xanax .50 mg PO HS. Gave pt Ativan 2mg  IV for insomnia. Will continue to monitor

## 2019-06-27 NOTE — Progress Notes (Addendum)
0901: Call back to sending nurse. No answer. Will try again later.  1250: Patient admitted to room 1W130. Patient in chair. Call light in reach. Patient oriented to unit. All questions answered.

## 2019-06-27 NOTE — ED Notes (Signed)
Attempted report x1 RN unable to take report at this time Left callback information

## 2019-06-27 NOTE — Evaluation (Signed)
Physical Therapy Evaluation & Discharge Patient Details Name: Terry Kemp MRN: [id number] DOB: 1945-12-22 Today's Date: 06/27/2019   History of Present Illness  Pt is a 73 y.o. male admitted 06/26/19 with fever and generalized weakness. Found to be COVID (+). Head CT with chronic lacunar infarct. Also with hyponatremia. PMH includes CVA x2 (2013, 2016), CAD, HTN, HLD, PAD, depression, anxiety.    Clinical Impression  Patient evaluated by Physical Therapy with no further acute PT needs identified. PTA, pt independent and lives with ex-wife. Today, pt independent with ambulation and ADL tasks. SpO2 94-95% on RA; no DOE/SOB noted with activity. Educ re: current condition, energy conservation strategies, importance of continued mobility. All education has been completed and the patient has no further questions. Acute PT is signing off. Thank you for this referral.    Follow Up Recommendations No PT follow up    Equipment Recommendations  None recommended by PT    Recommendations for Other Services       Precautions / Restrictions Restrictions Weight Bearing Restrictions: No      Mobility  Bed Mobility Overal bed mobility: Independent                Transfers Overall transfer level: Independent                  Ambulation/Gait Ambulation/Gait assistance: Independent Gait Distance (Feet): 500 Feet Assistive device: None Gait Pattern/deviations: Step-through pattern;Decreased stride length;Decreased dorsiflexion - left;Drifts right/left   Gait velocity interpretation: >2.62 ft/sec, indicative of community ambulatory General Gait Details: Pt aware of deficits with gait mechanics, including shuffling feet, able to correct with cues. No overt instability or LOB with higher level balance tasks  Stairs            Wheelchair Mobility    Modified Rankin (Stroke Patients Only)       Balance Overall balance assessment: Needs assistance   Sitting  balance-Leahy Scale: Normal       Standing balance-Leahy Scale: Good               High level balance activites: Side stepping;Backward walking;Direction changes;Turns;Sudden stops;Head turns High Level Balance Comments: No overt instability or LOB with higher level balance tasks             Pertinent Vitals/Pain Pain Assessment: No/denies pain    Home Living Family/patient expects to be discharged to:: Private residence Living Arrangements: Spouse/significant other(ex wife) Available Help at Discharge: Family;Available 24 hours/day Type of Home: House Home Access: Stairs to enter Entrance Stairs-Rails: Right Entrance Stairs-Number of Steps: 2-3 Home Layout: Two level;Laundry or work area in basement   Additional Comments: 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 Ex-wife active and cooks    Prior Function Level of Independence: Independent         Comments: Drives local distances. Mows yard. Reports h/o short-term memory deficits since strokes, indep to manage medications. Ex-wife does majority of cooking/household tasks     Hand Dominance        Extremity/Trunk Assessment   Upper Extremity Assessment Upper Extremity Assessment: Overall WFL for tasks assessed    Lower Extremity Assessment Lower Extremity Assessment: Generalized weakness(pt reports chronic RLE weakness, including foot drop, since stroke)       Communication   Communication: No difficulties  Cognition Arousal/Alertness: Awake/alert Behavior During Therapy: WFL for tasks assessed/performed Overall Cognitive Status: History of cognitive impairments - at baseline Area of Impairment: Attention;Memory  Current Attention Level: Selective;Alternating Memory: Decreased short-term memory         General Comments: Reports impaired cognition since stroke, specifically name recall/short-term memory deficits. Pt interacting appropriately; some  poor attention noted but easily redirected, likely baseline cognition. Not formally tested      General Comments General comments (skin integrity, edema, etc.): SpO2 94-95% on RA    Exercises     Assessment/Plan    PT Assessment Patent does not need any further PT services  PT Problem List         PT Treatment Interventions      PT Goals (Current goals can be found in the Care Plan section)  Acute Rehab PT Goals PT Goal Formulation: All assessment and education complete, DC therapy    Frequency     Barriers to discharge        Co-evaluation               AM-PAC PT "6 Clicks" Mobility  Outcome Measure Help needed turning from your back to your side while in a flat bed without using bedrails?: None Help needed moving from lying on your back to sitting on the side of a flat bed without using bedrails?: None Help needed moving to and from a bed to a chair (including a wheelchair)?: None Help needed standing up from a chair using your arms (e.g., wheelchair or bedside chair)?: None   Help needed climbing 3-5 steps with a railing? : None 6 Click Score: 20    End of Session   Activity Tolerance: Patient tolerated treatment well Patient left: in chair;with call bell/phone within reach Nurse Communication: Mobility status PT Visit Diagnosis: Other abnormalities of gait and mobility (R26.89)    Time: [id number] PT Time Calculation (min) (ACUTE ONLY): 30 min   Charges:   PT Evaluation $PT Eval Low Complexity: 1 Low PT Treatments $Gait Training: 8-22 mins   Terry Kemp, PT, DPT Acute Rehabilitation Services  Pager [phone] Office [phone]  Derry Lory 06/27/2019, 1:15 PM

## 2019-06-28 LAB — COMPREHENSIVE METABOLIC PANEL
ALT: 18 U/L (ref 0–44)
AST: 20 U/L (ref 15–41)
Albumin: 3.6 g/dL (ref 3.5–5.0)
Alkaline Phosphatase: 56 U/L (ref 38–126)
Anion gap: 9 (ref 5–15)
BUN: 16 mg/dL (ref 8–23)
CO2: 23 mmol/L (ref 22–32)
Calcium: 8.5 mg/dL — ABNORMAL LOW (ref 8.9–10.3)
Chloride: 99 mmol/L (ref 98–111)
Creatinine, Ser: 0.47 mg/dL — ABNORMAL LOW (ref 0.61–1.24)
GFR calc Af Amer: >60 mL/min (ref >60)
GFR calc non Af Amer: >60 mL/min (ref >60)
Glucose, Bld: 117 mg/dL — ABNORMAL HIGH (ref 70–99)
Potassium: 4 mmol/L (ref 3.5–5.1)
Sodium: 131 mmol/L — ABNORMAL LOW (ref 135–145)
Total Bilirubin: 0.6 mg/dL (ref 0.3–1.2)
Total Protein: 6.4 g/dL — ABNORMAL LOW (ref 6.5–8.1)

## 2019-06-28 LAB — CBC WITH DIFFERENTIAL/PLATELET
Abs Immature Granulocytes: 0.04 10*3/uL (ref 0.00–0.07)
Basophils Absolute: 0 10*3/uL (ref 0.0–0.1)
Basophils Relative: 0 %
Eosinophils Absolute: 0 10*3/uL (ref 0.0–0.5)
Eosinophils Relative: 0 %
HCT: 40.1 % (ref 39.0–52.0)
Hemoglobin: 13.7 g/dL (ref 13.0–17.0)
Immature Granulocytes: 1 %
Lymphocytes Relative: 12 %
Lymphs Abs: 1 10*3/uL (ref 0.7–4.0)
MCH: 30.4 pg (ref 26.0–34.0)
MCHC: 34.2 g/dL (ref 30.0–36.0)
MCV: 88.9 fL (ref 80.0–100.0)
Monocytes Absolute: 0.8 10*3/uL (ref 0.1–1.0)
Monocytes Relative: 10 %
Neutro Abs: 6.2 10*3/uL (ref 1.7–7.7)
Neutrophils Relative %: 77 %
Platelets: 152 10*3/uL (ref 150–400)
RBC: 4.51 MIL/uL (ref 4.22–5.81)
RDW: 12.2 % (ref 11.5–15.5)
WBC: 8 10*3/uL (ref 4.0–10.5)
nRBC: 0 % (ref 0.0–0.2)

## 2019-06-28 LAB — URIC ACID: Uric Acid, Serum: 4.1 mg/dL (ref 3.7–8.6)

## 2019-06-28 LAB — OSMOLALITY: Osmolality: 278 mOsm/kg (ref 275–295)

## 2019-06-28 LAB — FERRITIN: Ferritin: 216 ng/mL (ref 24–336)

## 2019-06-28 LAB — PROCALCITONIN: Procalcitonin: 1.3 ng/mL

## 2019-06-28 LAB — C-REACTIVE PROTEIN: CRP: 14.4 mg/dL — ABNORMAL HIGH (ref <1.0)

## 2019-06-28 LAB — D-DIMER, QUANTITATIVE: D-Dimer, Quant: 1.16 ug/mL-FEU — ABNORMAL HIGH (ref 0.00–0.50)

## 2019-06-28 MED ORDER — LACTATED RINGERS IV SOLN
INTRAVENOUS | Status: AC
  Administered 2019-06-28: 16:00:00 via INTRAVENOUS

## 2019-06-28 NOTE — Progress Notes (Signed)
PROGRESS NOTE                                                                                                                                                                                                             Patient Demographics:    Terry Kemp, is a 73 y.o. male, DOB - [birth year], HWE:[id number]  Outpatient Primary MD for the patient is Lawerance Cruel, MD    LOS - 1  Admit date - 06/26/2019    Chief Complaint  Patient presents with  . Fall  . Altered Mental Status       Brief Narrative - Terry Kemp is a 73 y.o. male with medical history significant of anxiety, arthritis, CAD status post PCI, depression, hypertension, hyperlipidemia, PAD, CVA with mild residual right-sided weakness presenting to the ED for evaluation of fever, fall, and altered mental status.  Further work-up showed that he had COVID-19 pneumonia.   Subjective:    Terry Kemp today has, No headache, No chest pain, No abdominal pain - No Nausea, No new weakness tingling or numbness, mild cough but no shortness of breath.   Assessment  & Plan :     1. Acute Hypoxic Resp. Failure due to Acute Covid 19 Viral Pneumonitis during the ongoing 2020 Covid 19 Pandemic - although his CRP is quite elevated he appears to be in no distress and currently stable on 1 L nasal cannula oxygen.  He is in no distress.  Continue IV steroids and remdesivir course.  Monitor closely.  Encouraged to sit up in chair and daytime use flutter valve and I-S for pulmonary toiletry and prone when in bed.   Actemra off label use - patient was told that if COVID-19 pneumonitis gets worse we might potentially use Actemra off label, she denies any known history of tuberculosis or hepatitis, understands the risks and benefits and wants to proceed with Actemra treatment if required.    COVID-19 Labs  Recent Labs    06/27/19 0448 06/27/19 0900 06/28/19  0231  DDIMER 2.58*  --  1.16*  FERRITIN  --  172 216  LDH 168  --   --   CRP  --   --  14.4*    Lab Results  Component Value Date   SARSCOV2NAA POSITIVE (A) 06/26/2019     SpO2: 96 % O2  Flow Rate (L/min): 2 L/min  Hepatic Function Latest Ref Rng & Units 06/28/2019 06/26/2019 04/11/2018  Total Protein 6.5 - 8.1 g/dL 6.4(L) 6.5 6.9  Albumin 3.5 - 5.0 g/dL 3.6 3.7 3.9  AST 15 - 41 U/L 20 24 23   ALT 0 - 44 U/L 18 20 22   Alk Phosphatase 38 - 126 U/L 56 59 83  Total Bilirubin 0.3 - 1.2 mg/dL 0.6 0.6 0.8     No results found for: BNP    2.  ETOH use - says he drinks 2 glasses of wine, which should be fine.  No signs of DTs monitor.  3.  History of anxiety.  On home dose Xanax.  4.  Fall at home.  History of previous CVA with mild right-sided weakness.  Stable.  CT head nonacute.  PT OT eval.  Continue home medications which include Plavix and statin for secondary prevention.  5.  CAD s/p PCI.  On combination of beta-blocker, Plavix and statin for secondary prevention.  No acute issues.  6.  Toxic encephalopathy upon admission.  Resolved.  At risk for delirium.  Minimize narcotics and benzodiazepines.  Use Haldol if needed at night.  7.  Hyponatremia.  Due to dehydration improving with gentle hydration.  Continue IV fluids for another 10 hours.    Condition - Fair  Family Communication  : Called wife's listed cell phone number at 10:25 AM on 06/28/2019 with no response.  Code Status :  Full  Diet :   Diet Order            Diet Heart Room service appropriate? Yes; Fluid consistency: Thin  Diet effective now               Disposition Plan  : Home once remdesivir course is finished on 07/01/2019  Consults  :  None  Procedures  :     PUD Prophylaxis :    DVT Prophylaxis  :  Lovenox    Lab Results  Component Value Date   PLT 152 06/28/2019    Inpatient Medications  Scheduled Meds: . amLODipine  10 mg Oral Daily  . atorvastatin  40 mg Oral Daily  .  clopidogrel  75 mg Oral Daily  . dexamethasone (DECADRON) injection  6 mg Intravenous Q24H  . dextromethorphan-guaiFENesin  1 tablet Oral BID  . enoxaparin (LOVENOX) injection  40 mg Subcutaneous Daily  . folic acid  1 mg Oral Daily  . metoprolol succinate  25 mg Oral Daily  . multivitamin with minerals  1 tablet Oral Daily  . thiamine  100 mg Oral Daily   Or  . thiamine  100 mg Intravenous Daily   Continuous Infusions: . remdesivir 100 mg in NS 250 mL     PRN Meds:.acetaminophen **OR** [DISCONTINUED] acetaminophen, ALPRAZolam, LORazepam **OR** LORazepam, menthol-cetylpyridinium, phenol  Antibiotics  :    Anti-infectives (From admission, onward)   Start     Dose/Rate Route Frequency Ordered Stop   06/28/19 1000  remdesivir 100 mg in sodium chloride 0.9 % 250 mL IVPB     100 mg 500 mL/hr over 30 Minutes Intravenous Every 24 hours 06/27/19 0503 07/02/19 0959   06/27/19 2200  cefTRIAXone (ROCEPHIN) 1 g in sodium chloride 0.9 % 100 mL IVPB  Status:  Discontinued     1 g 200 mL/hr over 30 Minutes Intravenous Every 24 hours 06/27/19 0252 06/28/19 0732   06/27/19 2200  azithromycin (ZITHROMAX) 500 mg in sodium chloride 0.9 % 250 mL IVPB  Status:  Discontinued     500 mg 250 mL/hr over 60 Minutes Intravenous Every 24 hours 06/27/19 0252 06/28/19 0732   06/27/19 0530  remdesivir 200 mg in sodium chloride 0.9 % 250 mL IVPB     200 mg 500 mL/hr over 30 Minutes Intravenous Once 06/27/19 0503 06/27/19 0714   06/26/19 2345  cefTRIAXone (ROCEPHIN) 1 g in sodium chloride 0.9 % 100 mL IVPB     1 g 200 mL/hr over 30 Minutes Intravenous  Once 06/26/19 2343 06/27/19 0110   06/26/19 2345  azithromycin (ZITHROMAX) 500 mg in sodium chloride 0.9 % 250 mL IVPB     500 mg 250 mL/hr over 60 Minutes Intravenous  Once 06/26/19 2343 06/27/19 0110       Time Spent in minutes  30   Lala Lund M.D on 06/28/2019 at 10:19 AM  To page go to [email or web address] - password [facility]  [facility] -  Office   [phone]    See all Orders from today for further details    Objective:   Vitals:   06/27/19 1254 06/27/19 1600 06/27/19 1921 06/28/19 0723  BP: (!) 161/83 (!) 158/108 (!) 149/82 (!) 157/89  Pulse: 68 77 76 67  Resp: 13 10 12 11   Temp: 98.7 F (37.1 C) 99.3 F (37.4 C) 99.2 F (37.3 C) 98.4 F (36.9 C)  TempSrc: Oral Oral Oral Oral  SpO2: 95%  94% 96%  Weight: 63.5 kg     Height: 5' 6"  (1.676 m)       Wt Readings from Last 3 Encounters:  06/27/19 63.5 kg  04/17/19 65.8 kg  06/05/18 64.5 kg     Intake/Output Summary (Last 24 hours) at 06/28/2019 1019 Last data filed at 06/28/2019 0936 Gross per 24 hour  Intake 1596.72 ml  Output -  Net 1596.72 ml     Physical Exam  Awake Alert, Oriented X 3, No new F.N deficits, Normal affect [place].AT,PERRAL Supple Neck,No JVD, No cervical lymphadenopathy appriciated.  Symmetrical Chest wall movement, Good air movement bilaterally, CTAB RRR,No Gallops,Rubs or new Murmurs, No Parasternal Heave +ve B.Sounds, Abd Soft, No tenderness, No organomegaly appriciated, No rebound - guarding or rigidity. No Cyanosis, Clubbing or edema, No new Rash or bruise       Data Review:    CBC Recent Labs  Lab 06/26/19 2234 06/28/19 0231  WBC 8.9 8.0  HGB 15.1 13.7  HCT 42.1 40.1  PLT 144* 152  MCV 87.9 88.9  MCH 31.5 30.4  MCHC 35.9 34.2  RDW 11.9 12.2  LYMPHSABS 0.5* 1.0  MONOABS 1.0 0.8  EOSABS 0.0 0.0  BASOSABS 0.0 0.0    Chemistries  Recent Labs  Lab 06/26/19 2234 06/27/19 0330 06/27/19 0900 06/27/19 1601 06/28/19 0231  NA 121* 126* 135 132* 131*  K 3.4* 3.8 3.5 4.1 4.0  CL 89* 95* 105 101 99  CO2 19* 19* 20* 22 23  GLUCOSE 172* 109* 113* 209* 117*  BUN 13 11 8 16 16   CREATININE 0.99 0.99 0.71 0.75 0.47*  CALCIUM 8.7* 8.5* 8.0* 8.7* 8.5*  AST 24  --   --   --  20  ALT 20  --   --   --  18  ALKPHOS 59  --   --   --  56  BILITOT 0.6  --   --   --  0.6    ------------------------------------------------------------------------------------------------------------------ No results for input(s): CHOL, HDL, LDLCALC, TRIG, CHOLHDL, LDLDIRECT in the last 72 hours.  No  results found for: HGBA1C ------------------------------------------------------------------------------------------------------------------ No results for input(s): TSH, T4TOTAL, T3FREE, THYROIDAB in the last 72 hours.  Invalid input(s): FREET3  Cardiac Enzymes No results for input(s): CKMB, TROPONINI, MYOGLOBIN in the last 168 hours.  Invalid input(s): CK ------------------------------------------------------------------------------------------------------------------ No results found for: BNP  Micro Results Recent Results (from the past 240 hour(s))  SARS CORONAVIRUS 2 (TAT 6-24 HRS) Nasopharyngeal Nasopharyngeal Swab     Status: Abnormal   Collection Time: 06/26/19 10:24 PM   Specimen: Nasopharyngeal Swab  Result Value Ref Range Status   SARS Coronavirus 2 POSITIVE (A) NEGATIVE Final    Comment: RESULT CALLED TO, READ BACK BY AND VERIFIED WITH: MARSHALL C, RN AT [phone] ON 06/27/2019 BY SAINVILUS S (NOTE) SARS-CoV-2 target nucleic acids are DETECTED. The SARS-CoV-2 RNA is generally detectable in upper and lower respiratory specimens during the acute phase of infection. Positive results are indicative of active infection with SARS-CoV-2. Clinical  correlation with patient history and other diagnostic information is necessary to determine patient infection status. Positive results do  not rule out bacterial infection or co-infection with other viruses. The expected result is Negative. Fact Sheet for Patients: SugarRoll.be Fact Sheet for Healthcare Providers: https://www.woods-mathews.com/ This test is not yet approved or cleared by the [place] FDA and  has been authorized for detection and/or diagnosis of SARS-CoV-2 by FDA  under an Emergency Use Authorization (EUA). This EUA will remain  in effect (meaning this test ca n be used) for the duration of the COVID-19 declaration under Section 564(b)(1) of the Act, 21 U.S.C. section 360bbb-3(b)(1), unless the authorization is terminated or revoked sooner. Performed at [facility], [place] [address] [place], [place] [address]   Blood Culture (routine x 2)     Status: None (Preliminary result)   Collection Time: 06/26/19 10:33 PM   Specimen: BLOOD RIGHT FOREARM  Result Value Ref Range Status   Specimen Description BLOOD RIGHT FOREARM  Final   Special Requests   Final    BOTTLES DRAWN AEROBIC AND ANAEROBIC Blood Culture adequate volume   Culture   Final    NO GROWTH 2 DAYS Performed at [facility], [place] [address] [place], [place] [address]    Report Status PENDING  Incomplete  Blood Culture (routine x 2)     Status: None (Preliminary result)   Collection Time: 06/26/19 11:24 PM   Specimen: BLOOD LEFT FOREARM  Result Value Ref Range Status   Specimen Description BLOOD LEFT FOREARM  Final   Special Requests   Final    BOTTLES DRAWN AEROBIC AND ANAEROBIC Blood Culture adequate volume   Culture   Final    NO GROWTH 1 DAY Performed at [facility], [place] [address], [place], [place] [address]    Report Status PENDING  Incomplete  Urine culture     Status: None (Preliminary result)   Collection Time: 06/27/19  1:52 AM   Specimen: In/Out Cath Urine  Result Value Ref Range Status   Specimen Description IN/OUT CATH URINE  Final   Special Requests NONE  Final   Culture   Final    CULTURE REINCUBATED FOR BETTER GROWTH Performed at [facility], [place] [address], [place], [place] [address]    Report Status PENDING  Incomplete    Radiology Reports Ct Head Wo Contrast  Result Date: 06/26/2019 CLINICAL DATA:  Altered LOC, fever EXAM: CT HEAD WITHOUT CONTRAST TECHNIQUE: Contiguous axial images were obtained from the base of the skull  through  the vertex without intravenous contrast. COMPARISON:  CT brain 08/16/2015, 09/24/2015, MRI 03/13/2019 FINDINGS: Brain: No hemorrhage or intracranial mass. Chronic left cerebellar lacunar infarct. Chronic lacunar infarcts in the left basal ganglia and white matter. Age indeterminate infarcts in the left thalamus. Atrophy. Mild small vessel ischemic change of the white matter. Stable ventricle size Vascular: No hyperdense vessels.  Carotid vascular calcification Skull: Normal. Negative for fracture or focal lesion. Sinuses/Orbits: Mucosal thickening in the maxillary, ethmoid and frontal sinuses Other: None. IMPRESSION: 1. Age indeterminate hypodensity/possible lacunar infarct left thalamus. 2. Atrophy and mild small vessel ischemic change of the white matter. Multifocal chronic appearing infarcts involving the left basal ganglia and cerebellum. Electronically Signed   By: Donavan Foil M.D.   On: 06/26/2019 22:42   Dg Chest Port 1 View  Result Date: 06/26/2019 CLINICAL DATA:  Fever. Sepsis. EXAM: PORTABLE CHEST 1 VIEW COMPARISON:  11/04/2014 FINDINGS: Patchy right lung base opacity. Minimal left lower lobe atelectasis. Normal heart size and mediastinal contours. No pulmonary edema, pleural effusion, or pneumothorax. No acute osseous abnormalities are seen. IMPRESSION: 1. Patchy right lung base opacity, suspicious for pneumonia in the setting of fever. 2. Minimal left lower lobe atelectasis. Electronically Signed   By: Keith Rake M.D.   On: 06/26/2019 23:30

## 2019-06-28 NOTE — Progress Notes (Signed)
PT Cancellation Note  Patient Details Name: Terry Kemp MRN: [id number] DOB: March 09, 1946   Cancelled Treatment:     Pt was evaluated by physical therapist 10/23 and was found to be at baseline independent level.  Patient evaluated by Physical Therapy with no further acute PT needs identified. PTA, pt independent and lives with ex-wife. Today, pt independent with ambulation and ADL tasks. SpO2 94-95% on RA; no DOE/SOB noted with activity. Educ re: current condition, energy conservation strategies, importance of continued mobility. All education has been completed and the patient has no further questions. Acute PT is signing off. Thank you for this referral.    At this time no further skilled acute care level PT is indicated. Thank you for this referral.    Delford Field 06/28/2019, 8:52 AM

## 2019-06-29 LAB — COMPREHENSIVE METABOLIC PANEL
ALT: 21 U/L (ref 0–44)
AST: 22 U/L (ref 15–41)
Albumin: 3.5 g/dL (ref 3.5–5.0)
Alkaline Phosphatase: 63 U/L (ref 38–126)
Anion gap: 10 (ref 5–15)
BUN: 17 mg/dL (ref 8–23)
CO2: 24 mmol/L (ref 22–32)
Calcium: 8.7 mg/dL — ABNORMAL LOW (ref 8.9–10.3)
Chloride: 97 mmol/L — ABNORMAL LOW (ref 98–111)
Creatinine, Ser: 0.68 mg/dL (ref 0.61–1.24)
GFR calc Af Amer: >60 mL/min (ref >60)
GFR calc non Af Amer: >60 mL/min (ref >60)
Glucose, Bld: 115 mg/dL — ABNORMAL HIGH (ref 70–99)
Potassium: 3.9 mmol/L (ref 3.5–5.1)
Sodium: 131 mmol/L — ABNORMAL LOW (ref 135–145)
Total Bilirubin: 0.6 mg/dL (ref 0.3–1.2)
Total Protein: 6.3 g/dL — ABNORMAL LOW (ref 6.5–8.1)

## 2019-06-29 LAB — URINE CULTURE: Culture: <10000 — AB

## 2019-06-29 LAB — CBC WITH DIFFERENTIAL/PLATELET
Abs Immature Granulocytes: 0.03 10*3/uL (ref 0.00–0.07)
Basophils Absolute: 0 10*3/uL (ref 0.0–0.1)
Basophils Relative: 0 %
Eosinophils Absolute: 0 10*3/uL (ref 0.0–0.5)
Eosinophils Relative: 0 %
HCT: 39.2 % (ref 39.0–52.0)
Hemoglobin: 13.7 g/dL (ref 13.0–17.0)
Immature Granulocytes: 0 %
Lymphocytes Relative: 12 %
Lymphs Abs: 1.2 10*3/uL (ref 0.7–4.0)
MCH: 30.9 pg (ref 26.0–34.0)
MCHC: 34.9 g/dL (ref 30.0–36.0)
MCV: 88.5 fL (ref 80.0–100.0)
Monocytes Absolute: 0.8 10*3/uL (ref 0.1–1.0)
Monocytes Relative: 8 %
Neutro Abs: 8 10*3/uL — ABNORMAL HIGH (ref 1.7–7.7)
Neutrophils Relative %: 80 %
Platelets: 175 10*3/uL (ref 150–400)
RBC: 4.43 MIL/uL (ref 4.22–5.81)
RDW: 11.9 % (ref 11.5–15.5)
WBC: 10 10*3/uL (ref 4.0–10.5)
nRBC: 0 % (ref 0.0–0.2)

## 2019-06-29 LAB — PROCALCITONIN: Procalcitonin: 0.77 ng/mL

## 2019-06-29 LAB — D-DIMER, QUANTITATIVE: D-Dimer, Quant: 0.75 ug/mL-FEU — ABNORMAL HIGH (ref 0.00–0.50)

## 2019-06-29 LAB — C-REACTIVE PROTEIN: CRP: 6.7 mg/dL — ABNORMAL HIGH (ref <1.0)

## 2019-06-29 NOTE — Progress Notes (Signed)
PROGRESS NOTE                                                                                                                                                                                                             Patient Demographics:    Terry Kemp, is a 73 y.o. male, DOB - 03-09-1946, VOH:[id number]  Outpatient Primary MD for the patient is Lawerance Cruel, MD    LOS - 2  Admit date - 06/26/2019    Chief Complaint  Patient presents with  . Fall  . Altered Mental Status       Brief Narrative - Terry Kemp is a 73 y.o. male with medical history significant of anxiety, arthritis, CAD status post PCI, depression, hypertension, hyperlipidemia, PAD, CVA with mild residual right-sided weakness presenting to the ED for evaluation of fever, fall, and altered mental status.  Further work-up showed that he had COVID-19 pneumonia.   Subjective:   Patient in bed, appears comfortable, denies any headache, no fever, no chest pain or pressure, no shortness of breath , no abdominal pain. No focal weakness.   Assessment  & Plan :     1. Acute Hypoxic Resp. Failure due to Acute Covid 19 Viral Pneumonitis during the ongoing 2020 Covid 19 Pandemic - he is clinically stable and improving, in no respiratory distress, currently on room air, started on IV steroids and remdesivir which will be continued.  Inflammatory markers are improving, he has been encouraged to sit in chair and daytime use I-S and flutter valve for pulmonary toiletry.  Prone at night when in bed.    COVID-19 Labs  Recent Labs    06/27/19 0448 06/27/19 0900 06/28/19 0231 06/29/19 0006  DDIMER 2.58*  --  1.16* 0.75*  FERRITIN  --  172 216  --   LDH 168  --   --   --   CRP  --   --  14.4* 6.7*    Lab Results  Component Value Date   SARSCOV2NAA POSITIVE (A) 06/26/2019     SpO2: 95 % O2 Flow Rate (L/min): 2 L/min  Hepatic Function  Latest Ref Rng & Units 06/29/2019 06/28/2019 06/26/2019  Total Protein 6.5 - 8.1 g/dL 6.3(L) 6.4(L) 6.5  Albumin 3.5 - 5.0 g/dL 3.5 3.6 3.7  AST 15 - 41 U/L 22 20 24  ALT 0 - 44 U/L 21 18 20   Alk Phosphatase 38 - 126 U/L 63 56 59  Total Bilirubin 0.3 - 1.2 mg/dL 0.6 0.6 0.6     No results found for: BNP    2.  ETOH use - says he drinks 2 glasses of wine, which should be fine.  No signs of DTs monitor.  3.  History of anxiety.  On home dose Xanax.  Of note patient in no distress but continuously asks for more Xanax saying that his home Xanax dose has been doubled, we have no evidence of that.  He is on his home dose Xanax.  4.  Fall at home.  History of previous CVA with mild right-sided weakness.  Stable.  CT head nonacute.  PT OT eval.  Continue home medications which include Plavix and statin for secondary prevention.  5.  CAD s/p PCI.  On combination of beta-blocker, Plavix and statin for secondary prevention.  No acute issues.  6.  Toxic encephalopathy upon admission.  Resolved.  At risk for delirium.  Minimize narcotics and benzodiazepines.  Use Haldol if needed at night.  7.  Essential hypertension.  Placed on Norvasc.   8.  Hyponatremia.  Due to dehydration improving with gentle hydration.  Continue IV fluids for another 10 hours.    Condition - Fair  Family Communication  : Called wife's listed cell phone number at 10:25 AM on 06/28/2019 with no response.  Code Status :  Full  Diet :   Diet Order            Diet Heart Room service appropriate? Yes; Fluid consistency: Thin  Diet effective now               Disposition Plan  : Home once remdesivir course is finished on 07/01/2019  Consults  :  None  Procedures  :     PUD Prophylaxis :    DVT Prophylaxis  :  Lovenox    Lab Results  Component Value Date   PLT 175 06/29/2019    Inpatient Medications  Scheduled Meds: . amLODipine  10 mg Oral Daily  . atorvastatin  40 mg Oral Daily  . clopidogrel  75  mg Oral Daily  . dexamethasone (DECADRON) injection  6 mg Intravenous Q24H  . dextromethorphan-guaiFENesin  1 tablet Oral BID  . enoxaparin (LOVENOX) injection  40 mg Subcutaneous Daily  . folic acid  1 mg Oral Daily  . metoprolol succinate  25 mg Oral Daily  . multivitamin with minerals  1 tablet Oral Daily  . thiamine  100 mg Oral Daily   Or  . thiamine  100 mg Intravenous Daily   Continuous Infusions: . remdesivir 100 mg in NS 250 mL 100 mg (06/28/19 1117)   PRN Meds:.acetaminophen **OR** [DISCONTINUED] acetaminophen, ALPRAZolam, LORazepam **OR** LORazepam, menthol-cetylpyridinium, phenol  Antibiotics  :    Anti-infectives (From admission, onward)   Start     Dose/Rate Route Frequency Ordered Stop   06/28/19 1000  remdesivir 100 mg in sodium chloride 0.9 % 250 mL IVPB     100 mg 500 mL/hr over 30 Minutes Intravenous Every 24 hours 06/27/19 0503 07/02/19 0959   06/27/19 2200  cefTRIAXone (ROCEPHIN) 1 g in sodium chloride 0.9 % 100 mL IVPB  Status:  Discontinued     1 g 200 mL/hr over 30 Minutes Intravenous Every 24 hours 06/27/19 0252 06/28/19 0732   06/27/19 2200  azithromycin (ZITHROMAX) 500 mg in sodium chloride 0.9 % 250  mL IVPB  Status:  Discontinued     500 mg 250 mL/hr over 60 Minutes Intravenous Every 24 hours 06/27/19 0252 06/28/19 0732   06/27/19 0530  remdesivir 200 mg in sodium chloride 0.9 % 250 mL IVPB     200 mg 500 mL/hr over 30 Minutes Intravenous Once 06/27/19 0503 06/27/19 0714   06/26/19 2345  cefTRIAXone (ROCEPHIN) 1 g in sodium chloride 0.9 % 100 mL IVPB     1 g 200 mL/hr over 30 Minutes Intravenous  Once 06/26/19 2343 06/27/19 0110   06/26/19 2345  azithromycin (ZITHROMAX) 500 mg in sodium chloride 0.9 % 250 mL IVPB     500 mg 250 mL/hr over 60 Minutes Intravenous  Once 06/26/19 2343 06/27/19 0110       Time Spent in minutes  30   Lala Lund M.D on 06/29/2019 at 10:18 AM  To page go to [email or web address] - password [facility]  [facility] -   Office  [phone]    See all Orders from today for further details    Objective:   Vitals:   06/28/19 1600 06/28/19 2000 06/29/19 0500 06/29/19 0724  BP: 140/89 (!) 169/87 (!) 162/90 (!) 155/84  Pulse: 64   66  Resp: 16  14 10   Temp: 98.2 F (36.8 C) 98.3 F (36.8 C) 98.2 F (36.8 C) 98.4 F (36.9 C)  TempSrc: Oral Oral Oral Oral  SpO2: 95%   95%  Weight:      Height:        Wt Readings from Last 3 Encounters:  06/27/19 63.5 kg  04/17/19 65.8 kg  06/05/18 64.5 kg     Intake/Output Summary (Last 24 hours) at 06/29/2019 1018 Last data filed at 06/29/2019 0700 Gross per 24 hour  Intake 1330 ml  Output 1450 ml  Net -120 ml     Physical Exam  Awake Alert,   No new F.N deficits, Normal affect [place].AT,PERRAL Supple Neck,No JVD, No cervical lymphadenopathy appriciated.  Symmetrical Chest wall movement, Good air movement bilaterally, CTAB RRR,No Gallops, Rubs or new Murmurs, No Parasternal Heave +ve B.Sounds, Abd Soft, No tenderness, No organomegaly appriciated, No rebound - guarding or rigidity. No Cyanosis, Clubbing or edema, No new Rash or bruise      Data Review:    CBC Recent Labs  Lab 06/26/19 2234 06/28/19 0231 06/29/19 0006  WBC 8.9 8.0 10.0  HGB 15.1 13.7 13.7  HCT 42.1 40.1 39.2  PLT 144* 152 175  MCV 87.9 88.9 88.5  MCH 31.5 30.4 30.9  MCHC 35.9 34.2 34.9  RDW 11.9 12.2 11.9  LYMPHSABS 0.5* 1.0 1.2  MONOABS 1.0 0.8 0.8  EOSABS 0.0 0.0 0.0  BASOSABS 0.0 0.0 0.0    Chemistries  Recent Labs  Lab 06/26/19 2234 06/27/19 0330 06/27/19 0900 06/27/19 1601 06/28/19 0231 06/29/19 0006  NA 121* 126* 135 132* 131* 131*  K 3.4* 3.8 3.5 4.1 4.0 3.9  CL 89* 95* 105 101 99 97*  CO2 19* 19* 20* 22 23 24   GLUCOSE 172* 109* 113* 209* 117* 115*  BUN 13 11 8 16 16 17   CREATININE 0.99 0.99 0.71 0.75 0.47* 0.68  CALCIUM 8.7* 8.5* 8.0* 8.7* 8.5* 8.7*  AST 24  --   --   --  20 22  ALT 20  --   --   --  18 21  ALKPHOS 59  --   --   --  56 63   BILITOT 0.6  --   --   --  0.6 0.6   ------------------------------------------------------------------------------------------------------------------ No results for input(s): CHOL, HDL, LDLCALC, TRIG, CHOLHDL, LDLDIRECT in the last 72 hours.  No results found for: HGBA1C ------------------------------------------------------------------------------------------------------------------ No results for input(s): TSH, T4TOTAL, T3FREE, THYROIDAB in the last 72 hours.  Invalid input(s): FREET3  Cardiac Enzymes No results for input(s): CKMB, TROPONINI, MYOGLOBIN in the last 168 hours.  Invalid input(s): CK ------------------------------------------------------------------------------------------------------------------ No results found for: BNP  Micro Results Recent Results (from the past 240 hour(s))  SARS CORONAVIRUS 2 (TAT 6-24 HRS) Nasopharyngeal Nasopharyngeal Swab     Status: Abnormal   Collection Time: 06/26/19 10:24 PM   Specimen: Nasopharyngeal Swab  Result Value Ref Range Status   SARS Coronavirus 2 POSITIVE (A) NEGATIVE Final    Comment: RESULT CALLED TO, READ BACK BY AND VERIFIED WITH: MARSHALL C, RN AT [phone] ON 06/27/2019 BY SAINVILUS S (NOTE) SARS-CoV-2 target nucleic acids are DETECTED. The SARS-CoV-2 RNA is generally detectable in upper and lower respiratory specimens during the acute phase of infection. Positive results are indicative of active infection with SARS-CoV-2. Clinical  correlation with patient history and other diagnostic information is necessary to determine patient infection status. Positive results do  not rule out bacterial infection or co-infection with other viruses. The expected result is Negative. Fact Sheet for Patients: SugarRoll.be Fact Sheet for Healthcare Providers: https://www.woods-mathews.com/ This test is not yet approved or cleared by the [place] FDA and  has been authorized for detection  and/or diagnosis of SARS-CoV-2 by FDA under an Emergency Use Authorization (EUA). This EUA will remain  in effect (meaning this test ca n be used) for the duration of the COVID-19 declaration under Section 564(b)(1) of the Act, 21 U.S.C. section 360bbb-3(b)(1), unless the authorization is terminated or revoked sooner. Performed at [facility], [place] [address], [place], [place] [address]   Blood Culture (routine x 2)     Status: None (Preliminary result)   Collection Time: 06/26/19 10:33 PM   Specimen: BLOOD RIGHT FOREARM  Result Value Ref Range Status   Specimen Description BLOOD RIGHT FOREARM  Final   Special Requests   Final    BOTTLES DRAWN AEROBIC AND ANAEROBIC Blood Culture adequate volume   Culture   Final    NO GROWTH 2 DAYS Performed at [facility], [place] [address] [place], [place] [address]    Report Status PENDING  Incomplete  Blood Culture (routine x 2)     Status: None (Preliminary result)   Collection Time: 06/26/19 11:24 PM   Specimen: BLOOD LEFT FOREARM  Result Value Ref Range Status   Specimen Description BLOOD LEFT FOREARM  Final   Special Requests   Final    BOTTLES DRAWN AEROBIC AND ANAEROBIC Blood Culture adequate volume   Culture   Final    NO GROWTH 1 DAY Performed at [facility], [place] [address] [place], [place] [address]    Report Status PENDING  Incomplete  Urine culture     Status: Abnormal   Collection Time: 06/27/19  1:52 AM   Specimen: In/Out Cath Urine  Result Value Ref Range Status   Specimen Description IN/OUT CATH URINE  Final   Special Requests NONE  Final   Culture (A)  Final    <10,000 COLONIES/mL INSIGNIFICANT GROWTH Performed at [facility], [place] [address] [place], [place] [address]    Report Status 06/29/2019 FINAL  Final    Radiology Reports Ct Head Wo Contrast  Result Date: 06/26/2019 CLINICAL DATA:  Altered LOC,  fever EXAM: CT HEAD WITHOUT CONTRAST TECHNIQUE: Contiguous axial images were obtained  from the base of the skull through the vertex without intravenous contrast. COMPARISON:  CT brain 08/16/2015, 09/24/2015, MRI 03/13/2019 FINDINGS: Brain: No hemorrhage or intracranial mass. Chronic left cerebellar lacunar infarct. Chronic lacunar infarcts in the left basal ganglia and white matter. Age indeterminate infarcts in the left thalamus. Atrophy. Mild small vessel ischemic change of the white matter. Stable ventricle size Vascular: No hyperdense vessels.  Carotid vascular calcification Skull: Normal. Negative for fracture or focal lesion. Sinuses/Orbits: Mucosal thickening in the maxillary, ethmoid and frontal sinuses Other: None. IMPRESSION: 1. Age indeterminate hypodensity/possible lacunar infarct left thalamus. 2. Atrophy and mild small vessel ischemic change of the white matter. Multifocal chronic appearing infarcts involving the left basal ganglia and cerebellum. Electronically Signed   By: Donavan Foil M.D.   On: 06/26/2019 22:42   Dg Chest Port 1 View  Result Date: 06/26/2019 CLINICAL DATA:  Fever. Sepsis. EXAM: PORTABLE CHEST 1 VIEW COMPARISON:  11/04/2014 FINDINGS: Patchy right lung base opacity. Minimal left lower lobe atelectasis. Normal heart size and mediastinal contours. No pulmonary edema, pleural effusion, or pneumothorax. No acute osseous abnormalities are seen. IMPRESSION: 1. Patchy right lung base opacity, suspicious for pneumonia in the setting of fever. 2. Minimal left lower lobe atelectasis. Electronically Signed   By: Keith Rake M.D.   On: 06/26/2019 23:30

## 2019-06-30 LAB — COMPREHENSIVE METABOLIC PANEL
ALT: 20 U/L (ref 0–44)
AST: 17 U/L (ref 15–41)
Albumin: 3.5 g/dL (ref 3.5–5.0)
Alkaline Phosphatase: 61 U/L (ref 38–126)
Anion gap: 11 (ref 5–15)
BUN: 18 mg/dL (ref 8–23)
CO2: 24 mmol/L (ref 22–32)
Calcium: 8.8 mg/dL — ABNORMAL LOW (ref 8.9–10.3)
Chloride: 95 mmol/L — ABNORMAL LOW (ref 98–111)
Creatinine, Ser: 0.67 mg/dL (ref 0.61–1.24)
GFR calc Af Amer: >60 mL/min (ref >60)
GFR calc non Af Amer: >60 mL/min (ref >60)
Glucose, Bld: 105 mg/dL — ABNORMAL HIGH (ref 70–99)
Potassium: 3.6 mmol/L (ref 3.5–5.1)
Sodium: 130 mmol/L — ABNORMAL LOW (ref 135–145)
Total Bilirubin: 0.6 mg/dL (ref 0.3–1.2)
Total Protein: 6.3 g/dL — ABNORMAL LOW (ref 6.5–8.1)

## 2019-06-30 LAB — CBC WITH DIFFERENTIAL/PLATELET
Abs Immature Granulocytes: 0.05 10*3/uL (ref 0.00–0.07)
Basophils Absolute: 0 10*3/uL (ref 0.0–0.1)
Basophils Relative: 0 %
Eosinophils Absolute: 0 10*3/uL (ref 0.0–0.5)
Eosinophils Relative: 0 %
HCT: 41.2 % (ref 39.0–52.0)
Hemoglobin: 14.2 g/dL (ref 13.0–17.0)
Immature Granulocytes: 1 %
Lymphocytes Relative: 17 %
Lymphs Abs: 1.6 10*3/uL (ref 0.7–4.0)
MCH: 30.7 pg (ref 26.0–34.0)
MCHC: 34.5 g/dL (ref 30.0–36.0)
MCV: 89 fL (ref 80.0–100.0)
Monocytes Absolute: 0.8 10*3/uL (ref 0.1–1.0)
Monocytes Relative: 8 %
Neutro Abs: 7 10*3/uL (ref 1.7–7.7)
Neutrophils Relative %: 74 %
Platelets: 201 10*3/uL (ref 150–400)
RBC: 4.63 MIL/uL (ref 4.22–5.81)
RDW: 12 % (ref 11.5–15.5)
WBC: 9.4 10*3/uL (ref 4.0–10.5)
nRBC: 0 % (ref 0.0–0.2)

## 2019-06-30 LAB — PROCALCITONIN: Procalcitonin: 0.32 ng/mL

## 2019-06-30 LAB — C-REACTIVE PROTEIN: CRP: 2.6 mg/dL — ABNORMAL HIGH (ref <1.0)

## 2019-06-30 LAB — D-DIMER, QUANTITATIVE: D-Dimer, Quant: 0.72 ug/mL-FEU — ABNORMAL HIGH (ref 0.00–0.50)

## 2019-06-30 MED ORDER — FUROSEMIDE 10 MG/ML IJ SOLN
20.0000 mg | Freq: Once | INTRAMUSCULAR | Status: AC
  Administered 2019-06-30: 20 mg via INTRAVENOUS
  Filled 2019-06-30: qty 2

## 2019-06-30 MED ORDER — HYDRALAZINE HCL 50 MG PO TABS
50.0000 mg | ORAL_TABLET | Freq: Three times a day (TID) | ORAL | Status: DC
  Administered 2019-06-30 – 2019-07-01 (×4): 50 mg via ORAL
  Filled 2019-06-30 (×4): qty 1

## 2019-06-30 MED ORDER — ALPRAZOLAM 0.5 MG PO TABS: 1.0000 mg | ORAL_TABLET | Freq: Every evening | ORAL | Status: DC | PRN

## 2019-06-30 NOTE — Progress Notes (Signed)
PROGRESS NOTE                                                                                                                                                                                                             Patient Demographics:    Terry Kemp, is a 73 y.o. male, DOB - [birth year], FHL:[id number]  Outpatient Primary MD for the patient is Lawerance Cruel, MD    LOS - 3  Admit date - 06/26/2019    Chief Complaint  Patient presents with  . Fall  . Altered Mental Status       Brief Narrative - Terry Kemp is a 73 y.o. male with medical history significant of anxiety, arthritis, CAD status post PCI, depression, hypertension, hyperlipidemia, PAD, CVA with mild residual right-sided weakness presenting to the ED for evaluation of fever, fall, and altered mental status.  Further work-up showed that he had COVID-19 pneumonia.   Subjective:   Patient in bed, appears comfortable, denies any headache, no fever, no chest pain or pressure, no shortness of breath , no abdominal pain. No focal weakness.   Assessment  & Plan :     1. Acute Hypoxic Resp. Failure due to Acute Covid 19 Viral Pneumonitis during the ongoing 2020 Covid 19 Pandemic - he is clinically stable and improving, in no respiratory distress, currently on room air, started on IV steroids and remdesivir which will be continued.  Inflammatory markers are improving, he has been encouraged to sit in chair and daytime use I-S and flutter valve for pulmonary toiletry.  Prone at night when in bed.    COVID-19 Labs  Recent Labs    06/28/19 0231 06/29/19 0006 06/30/19 0200  DDIMER 1.16* 0.75* 0.72*  FERRITIN 216  --   --   CRP 14.4* 6.7* 2.6*    Lab Results  Component Value Date   SARSCOV2NAA POSITIVE (A) 06/26/2019     SpO2: 97 % O2 Flow Rate (L/min): 2 L/min  Hepatic Function Latest Ref Rng & Units 06/30/2019 06/29/2019 06/28/2019   Total Protein 6.5 - 8.1 g/dL 6.3(L) 6.3(L) 6.4(L)  Albumin 3.5 - 5.0 g/dL 3.5 3.5 3.6  AST 15 - 41 U/L 17 22 20   ALT 0 - 44 U/L 20 21 18   Alk Phosphatase 38 - 126 U/L 61 63 56  Total Bilirubin 0.3 -  1.2 mg/dL 0.6 0.6 0.6     No results found for: BNP    2.  ETOH use - says he drinks 2 glasses of wine, which should be fine.  No signs of DTs monitor.  3.  History of anxiety.  On home dose Xanax.  Of note patient in no distress but continuously asks for more Xanax saying that his home Xanax dose has been doubled, we have no evidence of that.  He is on his home dose Xanax.  4.  Fall at home.  History of previous CVA with mild right-sided weakness.  Stable.  CT head nonacute. Stable on PT eval here..  Continue home medications which include Plavix and statin for secondary prevention.  5.  CAD s/p PCI.  On combination of beta-blocker, Plavix and statin for secondary prevention.  No acute issues.  6.  Toxic encephalopathy upon admission.  Resolved.  At risk for delirium.  Minimize narcotics and benzodiazepines.  Use Haldol if needed at night.  7.  Essential hypertension.  Placed on Norvasc, added hydralazine for better control.   8.  Hyponatremia.  Initially thought to be due to dehydration but now looks more like SIADH, urine lites are pending, will give a trial of low-dose Lasix and monitor.    Condition - Fair  Family Communication  : Called wife's listed cell phone number at 10:25 AM on 06/28/2019 with no response.  Code Status :  Full  Diet :   Diet Order            Diet Heart Room service appropriate? Yes; Fluid consistency: Thin  Diet effective now               Disposition Plan  : Home once remdesivir course is finished on 07/01/2019  Consults  :  None  Procedures  :     PUD Prophylaxis :    DVT Prophylaxis  :  Lovenox    Lab Results  Component Value Date   PLT 201 06/30/2019    Inpatient Medications  Scheduled Meds: . amLODipine  10 mg Oral Daily  .  atorvastatin  40 mg Oral Daily  . clopidogrel  75 mg Oral Daily  . dexamethasone (DECADRON) injection  6 mg Intravenous Q24H  . dextromethorphan-guaiFENesin  1 tablet Oral BID  . enoxaparin (LOVENOX) injection  40 mg Subcutaneous Daily  . folic acid  1 mg Oral Daily  . metoprolol succinate  25 mg Oral Daily  . multivitamin with minerals  1 tablet Oral Daily  . thiamine  100 mg Oral Daily   Or  . thiamine  100 mg Intravenous Daily   Continuous Infusions: . remdesivir 100 mg in NS 250 mL 100 mg (06/29/19 1113)   PRN Meds:.acetaminophen **OR** [DISCONTINUED] acetaminophen, ALPRAZolam, menthol-cetylpyridinium, phenol  Antibiotics  :    Anti-infectives (From admission, onward)   Start     Dose/Rate Route Frequency Ordered Stop   06/28/19 1000  remdesivir 100 mg in sodium chloride 0.9 % 250 mL IVPB     100 mg 500 mL/hr over 30 Minutes Intravenous Every 24 hours 06/27/19 0503 07/02/19 0959   06/27/19 2200  cefTRIAXone (ROCEPHIN) 1 g in sodium chloride 0.9 % 100 mL IVPB  Status:  Discontinued     1 g 200 mL/hr over 30 Minutes Intravenous Every 24 hours 06/27/19 0252 06/28/19 0732   06/27/19 2200  azithromycin (ZITHROMAX) 500 mg in sodium chloride 0.9 % 250 mL IVPB  Status:  Discontinued  500 mg 250 mL/hr over 60 Minutes Intravenous Every 24 hours 06/27/19 0252 06/28/19 0732   06/27/19 0530  remdesivir 200 mg in sodium chloride 0.9 % 250 mL IVPB     200 mg 500 mL/hr over 30 Minutes Intravenous Once 06/27/19 0503 06/27/19 0714   06/26/19 2345  cefTRIAXone (ROCEPHIN) 1 g in sodium chloride 0.9 % 100 mL IVPB     1 g 200 mL/hr over 30 Minutes Intravenous  Once 06/26/19 2343 06/27/19 0110   06/26/19 2345  azithromycin (ZITHROMAX) 500 mg in sodium chloride 0.9 % 250 mL IVPB     500 mg 250 mL/hr over 60 Minutes Intravenous  Once 06/26/19 2343 06/27/19 0110       Time Spent in minutes  30   Lala Lund M.D on 06/30/2019 at 9:16 AM  To page go to [email or web address] - password [email or web address]   [facility] -  Office  [phone]    See all Orders from today for further details    Objective:   Vitals:   06/29/19 1946 06/29/19 2000 06/30/19 0450 06/30/19 0750  BP:  (!) 169/93 (!) 147/81 (!) 156/81  Pulse:   62 64  Resp:   15 16  Temp: 98.1 F (36.7 C)  98.4 F (36.9 C) 98.8 F (37.1 C)  TempSrc: Oral  Oral Oral  SpO2:   96% 97%  Weight:      Height:        Wt Readings from Last 3 Encounters:  06/27/19 63.5 kg  04/17/19 65.8 kg  06/05/18 64.5 kg     Intake/Output Summary (Last 24 hours) at 06/30/2019 0916 Last data filed at 06/30/2019 0450 Gross per 24 hour  Intake 1210 ml  Output 1600 ml  Net -390 ml     Physical Exam  Awake Alert,   No new F.N deficits, Normal affect [place].AT,PERRAL Supple Neck,No JVD, No cervical lymphadenopathy appriciated.  Symmetrical Chest wall movement, Good air movement bilaterally, CTAB RRR,No Gallops, Rubs or new Murmurs, No Parasternal Heave +ve B.Sounds, Abd Soft, No tenderness, No organomegaly appriciated, No rebound - guarding or rigidity. No Cyanosis, Clubbing or edema, No new Rash or bruise   Data Review:    CBC Recent Labs  Lab 06/26/19 2234 06/28/19 0231 06/29/19 0006 06/30/19 0200  WBC 8.9 8.0 10.0 9.4  HGB 15.1 13.7 13.7 14.2  HCT 42.1 40.1 39.2 41.2  PLT 144* 152 175 201  MCV 87.9 88.9 88.5 89.0  MCH 31.5 30.4 30.9 30.7  MCHC 35.9 34.2 34.9 34.5  RDW 11.9 12.2 11.9 12.0  LYMPHSABS 0.5* 1.0 1.2 1.6  MONOABS 1.0 0.8 0.8 0.8  EOSABS 0.0 0.0 0.0 0.0  BASOSABS 0.0 0.0 0.0 0.0    Chemistries  Recent Labs  Lab 06/26/19 2234  06/27/19 0900 06/27/19 1601 06/28/19 0231 06/29/19 0006 06/30/19 0200  NA 121*   < > 135 132* 131* 131* 130*  K 3.4*   < > 3.5 4.1 4.0 3.9 3.6  CL 89*   < > 105 101 99 97* 95*  CO2 19*   < > 20* 22 23 24 24   GLUCOSE 172*   < > 113* 209* 117* 115* 105*  BUN 13   < > 8 16 16 17 18   CREATININE 0.99   < > 0.71 0.75 0.47* 0.68 0.67  CALCIUM 8.7*   < > 8.0* 8.7* 8.5*  8.7* 8.8*  AST 24  --   --   --  20 22 17   ALT 20  --   --   --  18 21 20   ALKPHOS 59  --   --   --  56 63 61  BILITOT 0.6  --   --   --  0.6 0.6 0.6   < > = values in this interval not displayed.   ------------------------------------------------------------------------------------------------------------------ No results for input(s): CHOL, HDL, LDLCALC, TRIG, CHOLHDL, LDLDIRECT in the last 72 hours.  No results found for: HGBA1C ------------------------------------------------------------------------------------------------------------------ No results for input(s): TSH, T4TOTAL, T3FREE, THYROIDAB in the last 72 hours.  Invalid input(s): FREET3  Cardiac Enzymes No results for input(s): CKMB, TROPONINI, MYOGLOBIN in the last 168 hours.  Invalid input(s): CK ------------------------------------------------------------------------------------------------------------------ No results found for: BNP  Micro Results Recent Results (from the past 240 hour(s))  SARS CORONAVIRUS 2 (TAT 6-24 HRS) Nasopharyngeal Nasopharyngeal Swab     Status: Abnormal   Collection Time: 06/26/19 10:24 PM   Specimen: Nasopharyngeal Swab  Result Value Ref Range Status   SARS Coronavirus 2 POSITIVE (A) NEGATIVE Final    Comment: RESULT CALLED TO, READ BACK BY AND VERIFIED WITH: MARSHALL C, RN AT [phone] ON 06/27/2019 BY SAINVILUS S (NOTE) SARS-CoV-2 target nucleic acids are DETECTED. The SARS-CoV-2 RNA is generally detectable in upper and lower respiratory specimens during the acute phase of infection. Positive results are indicative of active infection with SARS-CoV-2. Clinical  correlation with patient history and other diagnostic information is necessary to determine patient infection status. Positive results do  not rule out bacterial infection or co-infection with other viruses. The expected result is Negative. Fact Sheet for Patients: SugarRoll.be Fact Sheet for  Healthcare Providers: https://www.woods-mathews.com/ This test is not yet approved or cleared by the [place] FDA and  has been authorized for detection and/or diagnosis of SARS-CoV-2 by FDA under an Emergency Use Authorization (EUA). This EUA will remain  in effect (meaning this test ca n be used) for the duration of the COVID-19 declaration under Section 564(b)(1) of the Act, 21 U.S.C. section 360bbb-3(b)(1), unless the authorization is terminated or revoked sooner. Performed at [facility], [place] [address], [place], [place] [address]   Blood Culture (routine x 2)     Status: None (Preliminary result)   Collection Time: 06/26/19 10:33 PM   Specimen: BLOOD RIGHT FOREARM  Result Value Ref Range Status   Specimen Description BLOOD RIGHT FOREARM  Final   Special Requests   Final    BOTTLES DRAWN AEROBIC AND ANAEROBIC Blood Culture adequate volume   Culture   Final    NO GROWTH 4 DAYS Performed at [facility], [place] [address] [place], [place] [address]    Report Status PENDING  Incomplete  Blood Culture (routine x 2)     Status: None (Preliminary result)   Collection Time: 06/26/19 11:24 PM   Specimen: BLOOD LEFT FOREARM  Result Value Ref Range Status   Specimen Description BLOOD LEFT FOREARM  Final   Special Requests   Final    BOTTLES DRAWN AEROBIC AND ANAEROBIC Blood Culture adequate volume   Culture   Final    NO GROWTH 3 DAYS Performed at [facility], [place] [address], [place], [place] [address]    Report Status PENDING  Incomplete  Urine culture     Status: Abnormal   Collection Time: 06/27/19  1:52 AM   Specimen: In/Out Cath Urine  Result Value Ref Range Status   Specimen Description IN/OUT CATH URINE  Final   Special Requests NONE  Final   Culture (A)  Final    <10,000 COLONIES/mL  INSIGNIFICANT GROWTH Performed at [facility], [place] [address], [place], [place] [address]    Report Status 06/29/2019 FINAL  Final    Radiology  Reports Ct Head Wo Contrast  Result Date: 06/26/2019 CLINICAL DATA:  Altered LOC, fever EXAM: CT HEAD WITHOUT CONTRAST TECHNIQUE: Contiguous axial images were obtained from the base of the skull through the vertex without intravenous contrast. COMPARISON:  CT brain 08/16/2015, 09/24/2015, MRI 03/13/2019 FINDINGS: Brain: No hemorrhage or intracranial mass. Chronic left cerebellar lacunar infarct. Chronic lacunar infarcts in the left basal ganglia and white matter. Age indeterminate infarcts in the left thalamus. Atrophy. Mild small vessel ischemic change of the white matter. Stable ventricle size Vascular: No hyperdense vessels.  Carotid vascular calcification Skull: Normal. Negative for fracture or focal lesion. Sinuses/Orbits: Mucosal thickening in the maxillary, ethmoid and frontal sinuses Other: None. IMPRESSION: 1. Age indeterminate hypodensity/possible lacunar infarct left thalamus. 2. Atrophy and mild small vessel ischemic change of the white matter. Multifocal chronic appearing infarcts involving the left basal ganglia and cerebellum. Electronically Signed   By: Donavan Foil M.D.   On: 06/26/2019 22:42   Dg Chest Port 1 View  Result Date: 06/26/2019 CLINICAL DATA:  Fever. Sepsis. EXAM: PORTABLE CHEST 1 VIEW COMPARISON:  11/04/2014 FINDINGS: Patchy right lung base opacity. Minimal left lower lobe atelectasis. Normal heart size and mediastinal contours. No pulmonary edema, pleural effusion, or pneumothorax. No acute osseous abnormalities are seen. IMPRESSION: 1. Patchy right lung base opacity, suspicious for pneumonia in the setting of fever. 2. Minimal left lower lobe atelectasis. Electronically Signed   By: Keith Rake M.D.   On: 06/26/2019 23:30

## 2019-06-30 NOTE — Plan of Care (Signed)

## 2019-07-01 LAB — CULTURE, BLOOD (ROUTINE X 2)
Culture: NO GROWTH
Special Requests: ADEQUATE

## 2019-07-01 LAB — C-REACTIVE PROTEIN: CRP: 1.1 mg/dL — ABNORMAL HIGH (ref <1.0)

## 2019-07-01 MED ORDER — SODIUM CHLORIDE 0.9 % IV SOLN
100.0000 mg | INTRAVENOUS | Status: AC
  Administered 2019-07-01: 08:00:00 100 mg via INTRAVENOUS

## 2019-07-01 NOTE — Discharge Instructions (Signed)
Follow with Primary MD Lawerance Cruel, MD in 7 days   Get CBC, CMP, 2 view Chest X ray -  checked next visit within 1 week by Primary MD   Activity: As tolerated with Full fall precautions use walker/cane & assistance as needed  Disposition Home    Diet: Heart Healthy    Special Instructions: If you have smoked or chewed Tobacco  in the last 2 yrs please stop smoking, stop any regular Alcohol  and or any Recreational drug use.  On your next visit with your primary care physician please Get Medicines reviewed and adjusted.  Please request your Prim.MD to go over all Hospital Tests and Procedure/Radiological results at the follow up, please get all Hospital records sent to your Prim MD by signing hospital release before you go home.  If you experience worsening of your admission symptoms, develop shortness of breath, life threatening emergency, suicidal or homicidal thoughts you must seek medical attention immediately by calling 911 or calling your MD immediately  if symptoms less severe.  You Must read complete instructions/literature along with all the possible adverse reactions/side effects for all the Medicines you take and that have been prescribed to you. Take any new Medicines after you have completely understood and accpet all the possible adverse reactions/side effects.       Person Under Monitoring Name: Terry Kemp  Location: [address] [place] [address]   Infection Prevention Recommendations for Individuals Confirmed to have, or Being Evaluated for, 2019 Novel Coronavirus (COVID-19) Infection Who Receive Care at Home  Individuals who are confirmed to have, or are being evaluated for, COVID-19 should follow the prevention steps below until a healthcare provider or local or state health department says they can return to normal activities.  Stay home except to get medical care You should restrict activities outside your home, except for getting medical  care. Do not go to work, school, or public areas, and do not use public transportation or taxis.  Call ahead before visiting your doctor Before your medical appointment, call the healthcare provider and tell them that you have, or are being evaluated for, COVID-19 infection. This will help the healthcare providers office take steps to keep other people from getting infected. Ask your healthcare provider to call the local or state health department.  Monitor your symptoms Seek prompt medical attention if your illness is worsening (e.g., difficulty breathing). Before going to your medical appointment, call the healthcare provider and tell them that you have, or are being evaluated for, COVID-19 infection. Ask your healthcare provider to call the local or state health department.  Wear a facemask You should wear a facemask that covers your nose and mouth when you are in the same room with other people and when you visit a healthcare provider. People who live with or visit you should also wear a facemask while they are in the same room with you.  Separate yourself from other people in your home As much as possible, you should stay in a different room from other people in your home. Also, you should use a separate bathroom, if available.  Avoid sharing household items You should not share dishes, drinking glasses, cups, eating utensils, towels, bedding, or other items with other people in your home. After using these items, you should wash them thoroughly with soap and water.  Cover your coughs and sneezes Cover your mouth and nose with a tissue when you cough or sneeze, or you can cough or  sneeze into your sleeve. Throw used tissues in a lined trash can, and immediately wash your hands with soap and water for at least 20 seconds or use an alcohol-based hand rub.  Wash your [facility] your hands often and thoroughly with soap and water for at least 20 seconds. You can use an alcohol-based  hand sanitizer if soap and water are not available and if your hands are not visibly dirty. Avoid touching your eyes, nose, and mouth with unwashed hands.   Prevention Steps for Caregivers and Household Members of Individuals Confirmed to have, or Being Evaluated for, COVID-19 Infection Being Cared for in the Home  If you live with, or provide care at home for, a person confirmed to have, or being evaluated for, COVID-19 infection please follow these guidelines to prevent infection:  Follow healthcare providers instructions Make sure that you understand and can help the patient follow any healthcare provider instructions for all care.  Provide for the patients basic needs You should help the patient with basic needs in the home and provide support for getting groceries, prescriptions, and other personal needs.  Monitor the patients symptoms If they are getting sicker, call his or her medical provider and tell them that the patient has, or is being evaluated for, COVID-19 infection. This will help the healthcare providers office take steps to keep other people from getting infected. Ask the healthcare provider to call the local or state health department.  Limit the number of people who have contact with the patient  If possible, have only one caregiver for the patient.  Other household members should stay in another home or place of residence. If this is not possible, they should stay  in another room, or be separated from the patient as much as possible. Use a separate bathroom, if available.  Restrict visitors who do not have an essential need to be in the home.  Keep older adults, very young children, and other sick people away from the patient Keep older adults, very young children, and those who have compromised immune systems or chronic health conditions away from the patient. This includes people with chronic heart, lung, or kidney conditions, diabetes, and  cancer.  Ensure good ventilation Make sure that shared spaces in the home have good air flow, such as from an air conditioner or an opened window, weather permitting.  Wash your hands often  Wash your hands often and thoroughly with soap and water for at least 20 seconds. You can use an alcohol based hand sanitizer if soap and water are not available and if your hands are not visibly dirty.  Avoid touching your eyes, nose, and mouth with unwashed hands.  Use disposable paper towels to dry your hands. If not available, use dedicated cloth towels and replace them when they become wet.  Wear a facemask and gloves  Wear a disposable facemask at all times in the room and gloves when you touch or have contact with the patients blood, body fluids, and/or secretions or excretions, such as sweat, saliva, sputum, nasal mucus, vomit, urine, or feces.  Ensure the mask fits over your nose and mouth tightly, and do not touch it during use.  Throw out disposable facemasks and gloves after using them. Do not reuse.  Wash your hands immediately after removing your facemask and gloves.  If your personal clothing becomes contaminated, carefully remove clothing and launder. Wash your hands after handling contaminated clothing.  Place all used disposable facemasks, gloves, and other waste  in a lined container before disposing them with other household waste.  Remove gloves and wash your hands immediately after handling these items.  Do not share dishes, glasses, or other household items with the patient  Avoid sharing household items. You should not share dishes, drinking glasses, cups, eating utensils, towels, bedding, or other items with a patient who is confirmed to have, or being evaluated for, COVID-19 infection.  After the person uses these items, you should wash them thoroughly with soap and water.  Wash laundry thoroughly  Immediately remove and wash clothes or bedding that have blood, body  fluids, and/or secretions or excretions, such as sweat, saliva, sputum, nasal mucus, vomit, urine, or feces, on them.  Wear gloves when handling laundry from the patient.  Read and follow directions on labels of laundry or clothing items and detergent. In general, wash and dry with the warmest temperatures recommended on the label.  Clean all areas the individual has used often  Clean all touchable surfaces, such as counters, tabletops, doorknobs, bathroom fixtures, toilets, phones, keyboards, tablets, and bedside tables, every day. Also, clean any surfaces that may have blood, body fluids, and/or secretions or excretions on them.  Wear gloves when cleaning surfaces the patient has come in contact with.  Use a diluted bleach solution (e.g., dilute bleach with 1 part bleach and 10 parts water) or a household disinfectant with a label that says EPA-registered for coronaviruses. To make a bleach solution at home, add 1 tablespoon of bleach to 1 quart (4 cups) of water. For a larger supply, add  cup of bleach to 1 gallon (16 cups) of water.  Read labels of cleaning products and follow recommendations provided on product labels. Labels contain instructions for safe and effective use of the cleaning product including precautions you should take when applying the product, such as wearing gloves or eye protection and making sure you have good ventilation during use of the product.  Remove gloves and wash hands immediately after cleaning.  Monitor yourself for signs and symptoms of illness Caregivers and household members are considered close contacts, should monitor their health, and will be asked to limit movement outside of the home to the extent possible. Follow the monitoring steps for close contacts listed on the symptom monitoring form.   ? If you have additional questions, contact your local health department or call the epidemiologist on call at [phone] (available 24/7). ? This  guidance is subject to change. For the most up-to-date guidance from [facility], please refer to their website: YouBlogs.pl

## 2019-07-01 NOTE — Discharge Summary (Signed)
Terry Kemp [id number] DOB: 10/15/1945 DOA: 06/26/2019  PCP: Lawerance Cruel, MD  Admit date: 06/26/2019  Discharge date: 07/01/2019  Admitted From: Home   Disposition:  Home   Recommendations for Outpatient Follow-up:   Follow up with PCP in 1-2 weeks  PCP Please obtain BMP/CBC, 2 view CXR in 1week,  (see Discharge instructions)   PCP Please follow up on the following pending results:    Home Health: None   Equipment/Devices: None  Consultations: None  Discharge Condition: Stable    CODE STATUS: Full    Diet Recommendation: Heart Healthy     Chief Complaint  Patient presents with  . Fall  . Altered Mental Status     Brief history of present illness from the day of admission and additional interim summary    Terry A Schneideris a 73 y.o.malewith medical history significant ofanxiety, arthritis, CAD status post PCI, depression, hypertension, hyperlipidemia, PAD, CVA with mild residual right-sided weakness presenting to the ED for evaluation of fever, fall, and altered mental status.  Further work-up showed that he had COVID-19 pneumonia.                                                                 Hospital Course    1.  Acute Hypoxic Resp. Failure due to Acute Covid 19 Viral Pneumonitis during the ongoing 2020 Covid 19 Pandemic - he is clinically stable and improving, in no respiratory distress, remains symptom free on room air x 2 days, will finish his IV steroids and remdesivir and DC'd home with PCP follow up.  COVID-19 Labs  Recent Labs    06/29/19 0006 06/30/19 0200 07/01/19 0345  DDIMER 0.75* 0.72*  --   CRP 6.7* 2.6* 1.1*    Lab Results  Component Value Date   SARSCOV2NAA POSITIVE (A) 06/26/2019      2.  ETOH use - says he drinks 2 glasses of wine, which  should be fine.  No signs of DTs .  3.  History of anxiety.  On home dose Xanax.  Of note patient in no distress but continuously asks for more Xanax saying that his home Xanax dose has been doubled, we have no evidence of that.  He is on his home dose Xanax.  4.  Fall at home.  History of previous CVA with mild right-sided weakness.  Stable.  CT head nonacute. Stable on PT eval here..  Continue home medications which include Plavix and statin for secondary prevention.  5.  CAD s/p PCI.  On combination of beta-blocker, Plavix and statin for secondary prevention.  No acute issues.  6.  Toxic encephalopathy upon admission.  Resolved. Stable CT head.  7.  Essential hypertension.Stable on home Rx.   8.  Hyponatremia. Due to dehydration vs Mild SIADH improved with  IVF, PCP to recheck in 2 weeks.  Discharge diagnosis     Principal Problem:   Pneumonia due to COVID-19 virus Active Problems:   Acute on chronic respiratory failure with hypoxia (HCC)   Hyponatremia   Hypokalemia   AMS (altered mental status)    Discharge instructions    Discharge Instructions    Diet - low sodium heart healthy   Complete by: As directed    Discharge instructions   Complete by: As directed    Follow with Primary MD Lawerance Cruel, MD in 7 days   Get CBC, CMP, 2 view Chest X ray -  checked next visit within 1 week by Primary MD   Activity: As tolerated with Full fall precautions use walker/cane & assistance as needed  Disposition Home    Diet: Heart Healthy    Special Instructions: If you have smoked or chewed Tobacco  in the last 2 yrs please stop smoking, stop any regular Alcohol  and or any Recreational drug use.  On your next visit with your primary care physician please Get Medicines reviewed and adjusted.  Please request your Prim.MD to go over all Hospital Tests and Procedure/Radiological results at the follow up, please get all Hospital records sent to your Prim MD by signing  hospital release before you go home.  If you experience worsening of your admission symptoms, develop shortness of breath, life threatening emergency, suicidal or homicidal thoughts you must seek medical attention immediately by calling 911 or calling your MD immediately  if symptoms less severe.  You Must read complete instructions/literature along with all the possible adverse reactions/side effects for all the Medicines you take and that have been prescribed to you. Take any new Medicines after you have completely understood and accpet all the possible adverse reactions/side effects.   Increase activity slowly   Complete by: As directed    MyChart COVID-19 home monitoring program   Complete by: Jul 01, 2019    Is the patient willing to use the [facility] for home monitoring?: Yes   Temperature monitoring   Complete by: Jul 01, 2019    After how many days would you like to receive a notification of this patient's flowsheet entries?: 1      Discharge Medications   Allergies as of 07/01/2019      Reactions   Zoloft [sertraline Hcl] Other (See Comments)   Psychosis per patient.      Medication List    TAKE these medications   ALPRAZolam 1 MG tablet Commonly known as: XANAX Take 0.5 mg by mouth as needed for anxiety or sleep.   amLODipine 10 MG tablet Commonly known as: NORVASC TAKE 1 TABLET BY MOUTH ONCE DAILY   atorvastatin 40 MG tablet Commonly known as: LIPITOR Take 1 tablet (40 mg total) by mouth daily.   clopidogrel 75 MG tablet Commonly known as: PLAVIX Take 1 tablet by mouth daily   metoprolol succinate 25 MG 24 hr tablet Commonly known as: Toprol XL Take 1 tablet (25 mg total) by mouth daily.   multivitamin tablet Take 1 tablet by mouth daily.   ramipril 10 MG capsule Commonly known as: ALTACE TAKE 1 CAPSULE BY MOUTH DAILY   sertraline 100 MG tablet Commonly known as: ZOLOFT Take 1.5 tablets (150 mg total) by mouth daily.   VITAMIN B-12 CR PO  Take by mouth daily. Reported on 08/26/2015       Follow-up Information    Lawerance Cruel, MD. Schedule  an appointment as soon as possible for a visit in 1 week(s).   Specialty: Family Medicine Contact information: [id number] [place] [address] [phone]           Major procedures and Radiology Reports - PLEASE review detailed and final reports thoroughly  -       Ct Head Wo Contrast  Result Date: 06/26/2019 CLINICAL DATA:  Altered LOC, fever EXAM: CT HEAD WITHOUT CONTRAST TECHNIQUE: Contiguous axial images were obtained from the base of the skull through the vertex without intravenous contrast. COMPARISON:  CT brain 08/16/2015, 09/24/2015, MRI 03/13/2019 FINDINGS: Brain: No hemorrhage or intracranial mass. Chronic left cerebellar lacunar infarct. Chronic lacunar infarcts in the left basal ganglia and white matter. Age indeterminate infarcts in the left thalamus. Atrophy. Mild small vessel ischemic change of the white matter. Stable ventricle size Vascular: No hyperdense vessels.  Carotid vascular calcification Skull: Normal. Negative for fracture or focal lesion. Sinuses/Orbits: Mucosal thickening in the maxillary, ethmoid and frontal sinuses Other: None. IMPRESSION: 1. Age indeterminate hypodensity/possible lacunar infarct left thalamus. 2. Atrophy and mild small vessel ischemic change of the white matter. Multifocal chronic appearing infarcts involving the left basal ganglia and cerebellum. Electronically Signed   By: Donavan Foil M.D.   On: 06/26/2019 22:42   Dg Chest Port 1 View  Result Date: 06/26/2019 CLINICAL DATA:  Fever. Sepsis. EXAM: PORTABLE CHEST 1 VIEW COMPARISON:  11/04/2014 FINDINGS: Patchy right lung base opacity. Minimal left lower lobe atelectasis. Normal heart size and mediastinal contours. No pulmonary edema, pleural effusion, or pneumothorax. No acute osseous abnormalities are seen. IMPRESSION: 1. Patchy right lung base opacity, suspicious for  pneumonia in the setting of fever. 2. Minimal left lower lobe atelectasis. Electronically Signed   By: Keith Rake M.D.   On: 06/26/2019 23:30    Micro Results     Recent Results (from the past 240 hour(s))  SARS CORONAVIRUS 2 (TAT 6-24 HRS) Nasopharyngeal Nasopharyngeal Swab     Status: Abnormal   Collection Time: 06/26/19 10:24 PM   Specimen: Nasopharyngeal Swab  Result Value Ref Range Status   SARS Coronavirus 2 POSITIVE (A) NEGATIVE Final    Comment: RESULT CALLED TO, READ BACK BY AND VERIFIED WITH: MARSHALL C, RN AT [phone] ON 06/27/2019 BY SAINVILUS S (NOTE) SARS-CoV-2 target nucleic acids are DETECTED. The SARS-CoV-2 RNA is generally detectable in upper and lower respiratory specimens during the acute phase of infection. Positive results are indicative of active infection with SARS-CoV-2. Clinical  correlation with patient history and other diagnostic information is necessary to determine patient infection status. Positive results do  not rule out bacterial infection or co-infection with other viruses. The expected result is Negative. Fact Sheet for Patients: SugarRoll.be Fact Sheet for Healthcare Providers: https://www.woods-mathews.com/ This test is not yet approved or cleared by the [place] FDA and  has been authorized for detection and/or diagnosis of SARS-CoV-2 by FDA under an Emergency Use Authorization (EUA). This EUA will remain  in effect (meaning this test ca n be used) for the duration of the COVID-19 declaration under Section 564(b)(1) of the Act, 21 U.S.C. section 360bbb-3(b)(1), unless the authorization is terminated or revoked sooner. Performed at [facility], [place] [address], [place], [place] [address]   Blood Culture (routine x 2)     Status: None   Collection Time: 06/26/19 10:33 PM   Specimen: BLOOD RIGHT FOREARM  Result Value Ref Range Status   Specimen Description BLOOD RIGHT FOREARM  Final  Special Requests   Final    BOTTLES DRAWN AEROBIC AND ANAEROBIC Blood Culture adequate volume   Culture   Final    NO GROWTH 5 DAYS Performed at [facility], [place] [address], [place], [place] [address]    Report Status 07/01/2019 FINAL  Final  Blood Culture (routine x 2)     Status: None (Preliminary result)   Collection Time: 06/26/19 11:24 PM   Specimen: BLOOD LEFT FOREARM  Result Value Ref Range Status   Specimen Description BLOOD LEFT FOREARM  Final   Special Requests   Final    BOTTLES DRAWN AEROBIC AND ANAEROBIC Blood Culture adequate volume   Culture   Final    NO GROWTH 4 DAYS Performed at [facility], [place] [address] [place], [place] [address]    Report Status PENDING  Incomplete  Urine culture     Status: Abnormal   Collection Time: 06/27/19  1:52 AM   Specimen: In/Out Cath Urine  Result Value Ref Range Status   Specimen Description IN/OUT CATH URINE  Final   Special Requests NONE  Final   Culture (A)  Final    <10,000 COLONIES/mL INSIGNIFICANT GROWTH Performed at [facility], [place] [address] [place], [place] [address]    Report Status 06/29/2019 FINAL  Final    Today   Subjective    [place] today has no headache,no chest abdominal pain,no new weakness tingling or numbness, feels much better wants to go home today.     Objective   Blood pressure 140/88, pulse 68, temperature 97.7 F (36.5 C), temperature source Oral, resp. rate 20, height 5\' 6"  (1.676 m), weight 63.5 kg, SpO2 97 %.   Intake/Output Summary (Last 24 hours) at 07/01/2019 0857 Last data filed at 07/01/2019 [id number] Gross per 24 hour  Intake 850 ml  Output 1650 ml  Net -800 ml    Exam Awake Alert,  No new F.N deficits, Normal affect [place].AT,PERRAL Supple Neck,No JVD, No cervical lymphadenopathy appriciated.  Symmetrical Chest wall movement, Good air movement bilaterally, CTAB RRR,No Gallops,Rubs or new Murmurs, No Parasternal Heave +ve B.Sounds, Abd Soft, Non  tender, No organomegaly appriciated, No rebound -guarding or rigidity. No Cyanosis, Clubbing or edema, No new Rash or bruise   Data Review   CBC w Diff:  Lab Results  Component Value Date   WBC 9.4 06/30/2019   HGB 14.2 06/30/2019   HCT 41.2 06/30/2019   PLT 201 06/30/2019   LYMPHOPCT 17 06/30/2019   MONOPCT 8 06/30/2019   EOSPCT 0 06/30/2019   BASOPCT 0 06/30/2019    CMP:  Lab Results  Component Value Date   NA 130 (L) 06/30/2019   NA 134 04/22/2014   K 3.6 06/30/2019   CL 95 (L) 06/30/2019   CO2 24 06/30/2019   BUN 18 06/30/2019   BUN 7 (L) 04/22/2014   CREATININE 0.67 06/30/2019   CREATININE 0.76 05/21/2015   PROT 6.3 (L) 06/30/2019   PROT 6.8 04/22/2014   ALBUMIN 3.5 06/30/2019   ALBUMIN 4.6 04/22/2014   BILITOT 0.6 06/30/2019   ALKPHOS 61 06/30/2019   AST 17 06/30/2019   ALT 20 06/30/2019  .   Total Time in preparing paper work, data evaluation and todays exam - 44 minutes  Lala Lund M.D on 07/01/2019 at 8:57 AM  [facility]   Office  [phone]

## 2019-07-02 LAB — CULTURE, BLOOD (ROUTINE X 2)
Culture: NO GROWTH
Special Requests: ADEQUATE

## 2019-07-17 DIAGNOSIS — I2510 Atherosclerotic heart disease of native coronary artery without angina pectoris: Secondary | ICD-10-CM | POA: Diagnosis not present

## 2019-07-17 DIAGNOSIS — E7800 Pure hypercholesterolemia, unspecified: Secondary | ICD-10-CM | POA: Diagnosis not present

## 2019-07-17 DIAGNOSIS — F324 Major depressive disorder, single episode, in partial remission: Secondary | ICD-10-CM | POA: Diagnosis not present

## 2019-07-17 DIAGNOSIS — I10 Essential (primary) hypertension: Secondary | ICD-10-CM | POA: Diagnosis not present

## 2019-07-17 DIAGNOSIS — J449 Chronic obstructive pulmonary disease, unspecified: Secondary | ICD-10-CM | POA: Diagnosis not present

## 2019-07-24 DIAGNOSIS — B342 Coronavirus infection, unspecified: Secondary | ICD-10-CM | POA: Diagnosis not present

## 2019-07-24 DIAGNOSIS — I10 Essential (primary) hypertension: Secondary | ICD-10-CM | POA: Diagnosis not present

## 2019-07-24 DIAGNOSIS — J189 Pneumonia, unspecified organism: Secondary | ICD-10-CM | POA: Diagnosis not present

## 2019-07-24 DIAGNOSIS — Z09 Encounter for follow-up examination after completed treatment for conditions other than malignant neoplasm: Secondary | ICD-10-CM | POA: Diagnosis not present

## 2019-10-17 DIAGNOSIS — I2510 Atherosclerotic heart disease of native coronary artery without angina pectoris: Secondary | ICD-10-CM | POA: Diagnosis not present

## 2019-10-17 DIAGNOSIS — F324 Major depressive disorder, single episode, in partial remission: Secondary | ICD-10-CM | POA: Diagnosis not present

## 2019-10-17 DIAGNOSIS — I10 Essential (primary) hypertension: Secondary | ICD-10-CM | POA: Diagnosis not present

## 2019-10-17 DIAGNOSIS — E7800 Pure hypercholesterolemia, unspecified: Secondary | ICD-10-CM | POA: Diagnosis not present

## 2019-10-17 DIAGNOSIS — J449 Chronic obstructive pulmonary disease, unspecified: Secondary | ICD-10-CM | POA: Diagnosis not present

## 2019-11-18 DIAGNOSIS — F324 Major depressive disorder, single episode, in partial remission: Secondary | ICD-10-CM | POA: Diagnosis not present

## 2019-11-18 DIAGNOSIS — R5381 Other malaise: Secondary | ICD-10-CM | POA: Diagnosis not present

## 2019-12-02 DIAGNOSIS — R262 Difficulty in walking, not elsewhere classified: Secondary | ICD-10-CM | POA: Diagnosis not present

## 2019-12-02 DIAGNOSIS — R531 Weakness: Secondary | ICD-10-CM | POA: Diagnosis not present

## 2019-12-02 DIAGNOSIS — R5381 Other malaise: Secondary | ICD-10-CM | POA: Diagnosis not present

## 2019-12-09 DIAGNOSIS — R531 Weakness: Secondary | ICD-10-CM | POA: Diagnosis not present

## 2019-12-09 DIAGNOSIS — R5381 Other malaise: Secondary | ICD-10-CM | POA: Diagnosis not present

## 2019-12-09 DIAGNOSIS — R262 Difficulty in walking, not elsewhere classified: Secondary | ICD-10-CM | POA: Diagnosis not present

## 2019-12-11 DIAGNOSIS — R531 Weakness: Secondary | ICD-10-CM | POA: Diagnosis not present

## 2019-12-11 DIAGNOSIS — R262 Difficulty in walking, not elsewhere classified: Secondary | ICD-10-CM | POA: Diagnosis not present

## 2019-12-11 DIAGNOSIS — R5381 Other malaise: Secondary | ICD-10-CM | POA: Diagnosis not present

## 2019-12-16 DIAGNOSIS — R262 Difficulty in walking, not elsewhere classified: Secondary | ICD-10-CM | POA: Diagnosis not present

## 2019-12-16 DIAGNOSIS — R5381 Other malaise: Secondary | ICD-10-CM | POA: Diagnosis not present

## 2019-12-16 DIAGNOSIS — R531 Weakness: Secondary | ICD-10-CM | POA: Diagnosis not present

## 2019-12-17 DIAGNOSIS — F411 Generalized anxiety disorder: Secondary | ICD-10-CM | POA: Diagnosis not present

## 2019-12-17 DIAGNOSIS — F331 Major depressive disorder, recurrent, moderate: Secondary | ICD-10-CM | POA: Diagnosis not present

## 2019-12-17 DIAGNOSIS — E559 Vitamin D deficiency, unspecified: Secondary | ICD-10-CM | POA: Diagnosis not present

## 2019-12-18 DIAGNOSIS — R262 Difficulty in walking, not elsewhere classified: Secondary | ICD-10-CM | POA: Diagnosis not present

## 2019-12-18 DIAGNOSIS — R531 Weakness: Secondary | ICD-10-CM | POA: Diagnosis not present

## 2019-12-18 DIAGNOSIS — R5381 Other malaise: Secondary | ICD-10-CM | POA: Diagnosis not present

## 2019-12-23 DIAGNOSIS — R262 Difficulty in walking, not elsewhere classified: Secondary | ICD-10-CM | POA: Diagnosis not present

## 2019-12-23 DIAGNOSIS — R531 Weakness: Secondary | ICD-10-CM | POA: Diagnosis not present

## 2019-12-23 DIAGNOSIS — R5381 Other malaise: Secondary | ICD-10-CM | POA: Diagnosis not present

## 2019-12-25 DIAGNOSIS — R5381 Other malaise: Secondary | ICD-10-CM | POA: Diagnosis not present

## 2019-12-25 DIAGNOSIS — R262 Difficulty in walking, not elsewhere classified: Secondary | ICD-10-CM | POA: Diagnosis not present

## 2019-12-25 DIAGNOSIS — R531 Weakness: Secondary | ICD-10-CM | POA: Diagnosis not present

## 2019-12-30 DIAGNOSIS — R5381 Other malaise: Secondary | ICD-10-CM | POA: Diagnosis not present

## 2019-12-30 DIAGNOSIS — R531 Weakness: Secondary | ICD-10-CM | POA: Diagnosis not present

## 2019-12-30 DIAGNOSIS — R262 Difficulty in walking, not elsewhere classified: Secondary | ICD-10-CM | POA: Diagnosis not present

## 2020-01-05 DIAGNOSIS — R531 Weakness: Secondary | ICD-10-CM | POA: Diagnosis not present

## 2020-01-05 DIAGNOSIS — R262 Difficulty in walking, not elsewhere classified: Secondary | ICD-10-CM | POA: Diagnosis not present

## 2020-01-05 DIAGNOSIS — R5381 Other malaise: Secondary | ICD-10-CM | POA: Diagnosis not present

## 2020-01-08 DIAGNOSIS — R262 Difficulty in walking, not elsewhere classified: Secondary | ICD-10-CM | POA: Diagnosis not present

## 2020-01-08 DIAGNOSIS — R531 Weakness: Secondary | ICD-10-CM | POA: Diagnosis not present

## 2020-01-08 DIAGNOSIS — R5381 Other malaise: Secondary | ICD-10-CM | POA: Diagnosis not present

## 2020-01-13 DIAGNOSIS — R262 Difficulty in walking, not elsewhere classified: Secondary | ICD-10-CM | POA: Diagnosis not present

## 2020-01-13 DIAGNOSIS — R5381 Other malaise: Secondary | ICD-10-CM | POA: Diagnosis not present

## 2020-01-13 DIAGNOSIS — R531 Weakness: Secondary | ICD-10-CM | POA: Diagnosis not present

## 2020-01-15 DIAGNOSIS — R5381 Other malaise: Secondary | ICD-10-CM | POA: Diagnosis not present

## 2020-01-15 DIAGNOSIS — R262 Difficulty in walking, not elsewhere classified: Secondary | ICD-10-CM | POA: Diagnosis not present

## 2020-01-15 DIAGNOSIS — R531 Weakness: Secondary | ICD-10-CM | POA: Diagnosis not present

## 2020-01-19 DIAGNOSIS — E291 Testicular hypofunction: Secondary | ICD-10-CM | POA: Diagnosis not present

## 2020-01-19 DIAGNOSIS — F324 Major depressive disorder, single episode, in partial remission: Secondary | ICD-10-CM | POA: Diagnosis not present

## 2020-01-19 DIAGNOSIS — F411 Generalized anxiety disorder: Secondary | ICD-10-CM | POA: Diagnosis not present

## 2020-01-19 DIAGNOSIS — J449 Chronic obstructive pulmonary disease, unspecified: Secondary | ICD-10-CM | POA: Diagnosis not present

## 2020-01-19 DIAGNOSIS — I69359 Hemiplegia and hemiparesis following cerebral infarction affecting unspecified side: Secondary | ICD-10-CM | POA: Diagnosis not present

## 2020-01-29 DIAGNOSIS — J449 Chronic obstructive pulmonary disease, unspecified: Secondary | ICD-10-CM | POA: Diagnosis not present

## 2020-01-29 DIAGNOSIS — I69359 Hemiplegia and hemiparesis following cerebral infarction affecting unspecified side: Secondary | ICD-10-CM | POA: Diagnosis not present

## 2020-01-29 DIAGNOSIS — F411 Generalized anxiety disorder: Secondary | ICD-10-CM | POA: Diagnosis not present

## 2020-01-29 DIAGNOSIS — E291 Testicular hypofunction: Secondary | ICD-10-CM | POA: Diagnosis not present

## 2020-01-29 DIAGNOSIS — F324 Major depressive disorder, single episode, in partial remission: Secondary | ICD-10-CM | POA: Diagnosis not present

## 2020-02-12 ENCOUNTER — Other Ambulatory Visit: Payer: Self-pay | Admitting: Cardiology

## 2020-02-12 DIAGNOSIS — I10 Essential (primary) hypertension: Secondary | ICD-10-CM | POA: Diagnosis not present

## 2020-02-12 DIAGNOSIS — E291 Testicular hypofunction: Secondary | ICD-10-CM | POA: Diagnosis not present

## 2020-04-02 DIAGNOSIS — F331 Major depressive disorder, recurrent, moderate: Secondary | ICD-10-CM | POA: Diagnosis not present

## 2020-04-13 DIAGNOSIS — F331 Major depressive disorder, recurrent, moderate: Secondary | ICD-10-CM | POA: Diagnosis not present

## 2020-04-26 DIAGNOSIS — E291 Testicular hypofunction: Secondary | ICD-10-CM | POA: Diagnosis not present

## 2020-04-26 DIAGNOSIS — F411 Generalized anxiety disorder: Secondary | ICD-10-CM | POA: Diagnosis not present

## 2020-04-26 DIAGNOSIS — R2689 Other abnormalities of gait and mobility: Secondary | ICD-10-CM | POA: Diagnosis not present

## 2020-04-27 DIAGNOSIS — F331 Major depressive disorder, recurrent, moderate: Secondary | ICD-10-CM | POA: Diagnosis not present

## 2020-05-03 IMAGING — CT CT HEAD W/O CM
3 series · 16 of 47 positions shown, 19 images · non-contrast
Comparison: CT brain 08/16/2015, 09/24/2015, MRI 03/13/2019

CLINICAL DATA: Altered LOC, fever

EXAM:
CT HEAD WITHOUT CONTRAST
TECHNIQUE: Contiguous axial images were obtained from the base of the skull
through the vertex without intravenous contrast.

[Series 3: head 5.0 h30s · axial · 0.44mm/px · z∈[-130,+10]mm · 10 of 34 slices shown, 13 images]
[im 3/34  brain]
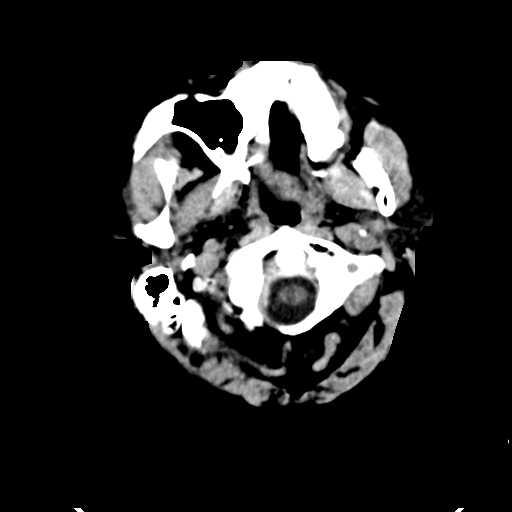
[im 3/34  bone]
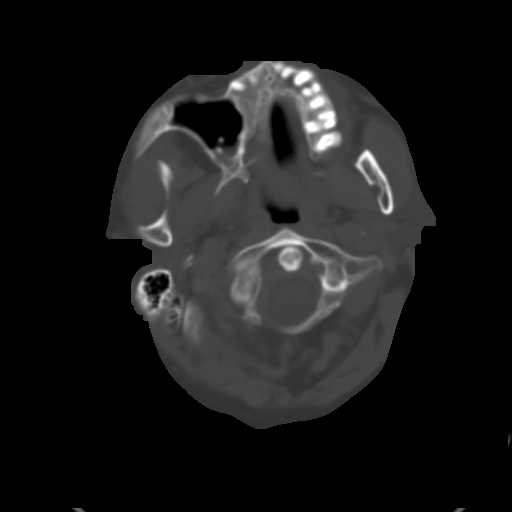
[im 6/34  brain]
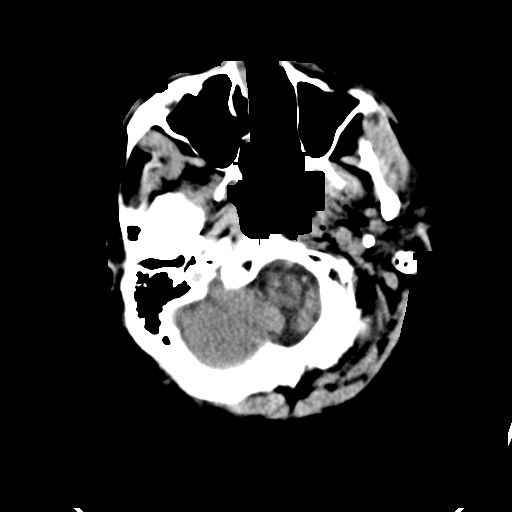
[im 10/34  brain]
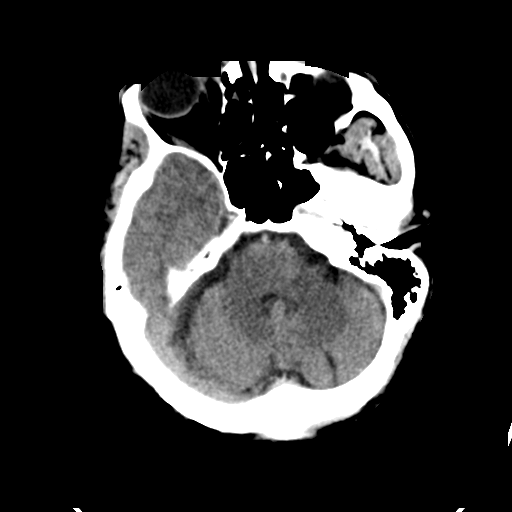
[im 12/34  brain]
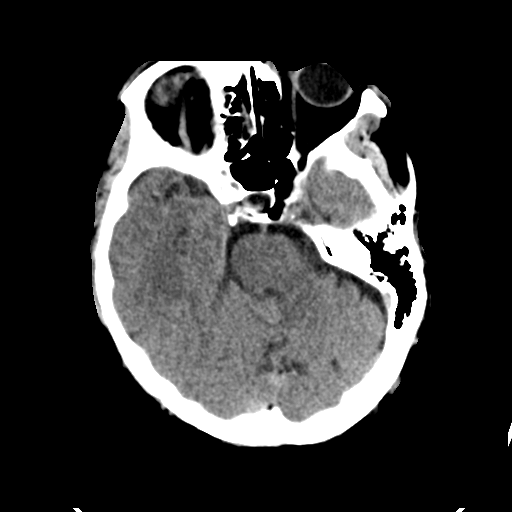
[im 15/34  brain]
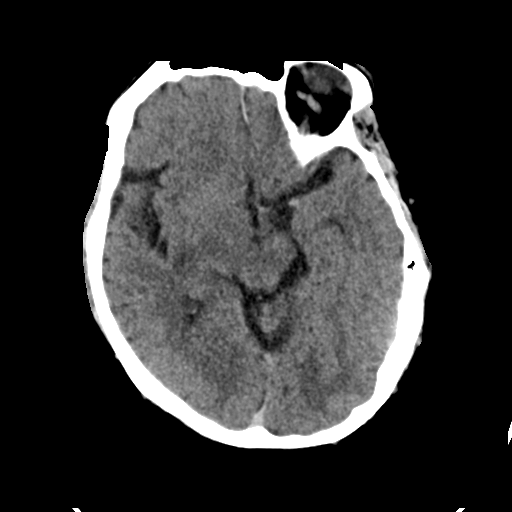
[im 15/34  bone]
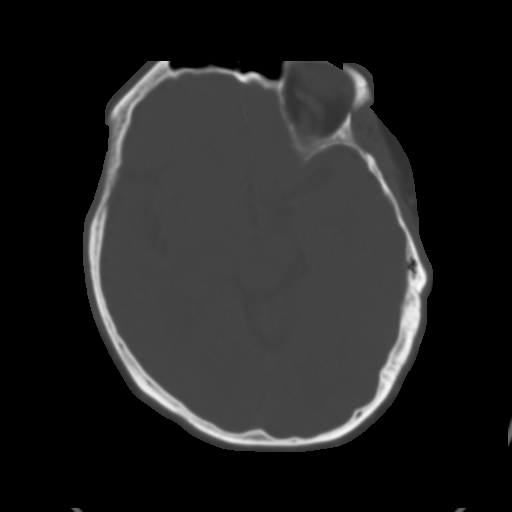
[im 19/34  brain]
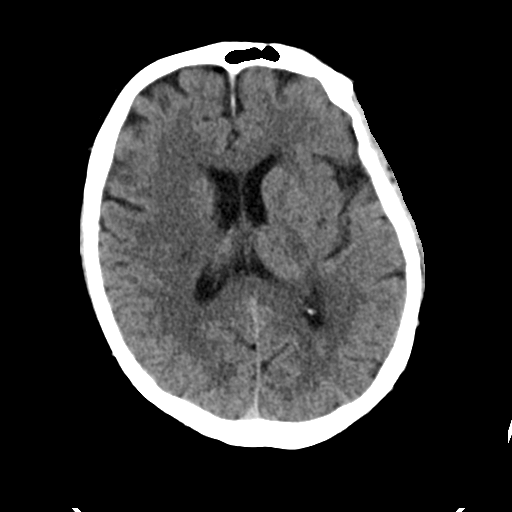
[im 22/34  brain]
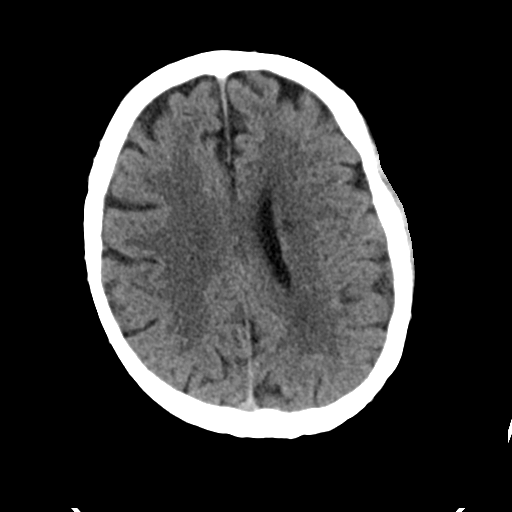
[im 26/34  brain]
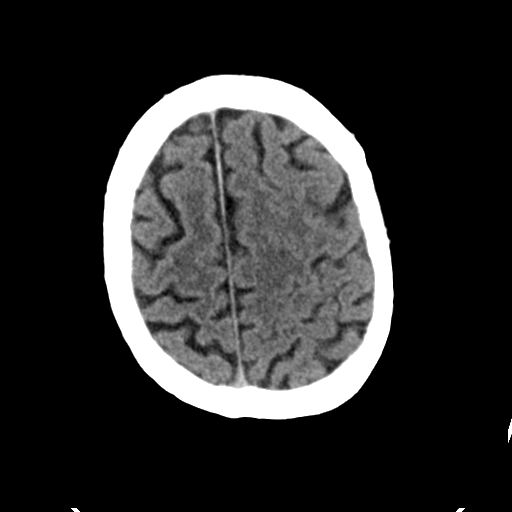
[im 28/34  brain]
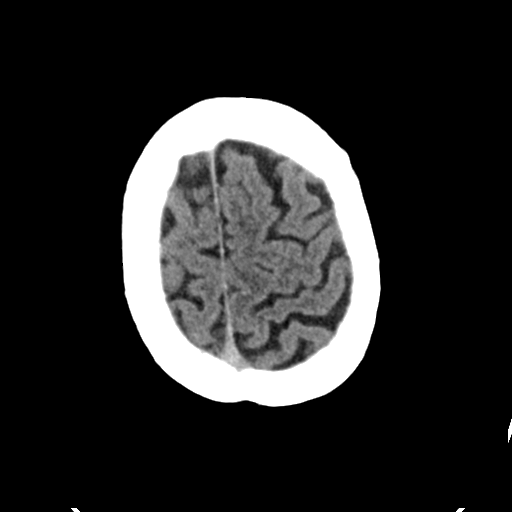
[im 28/34  bone]
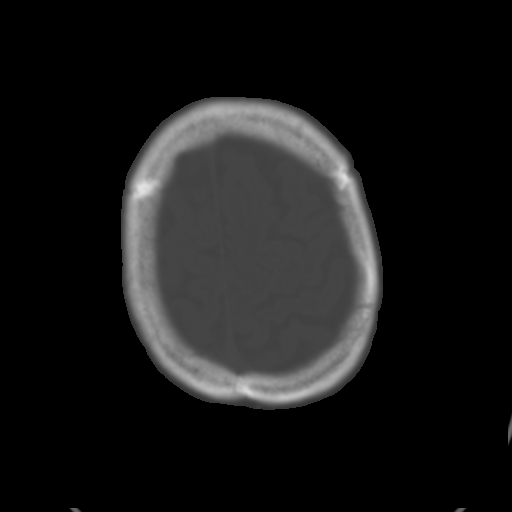
[im 31/34  brain]
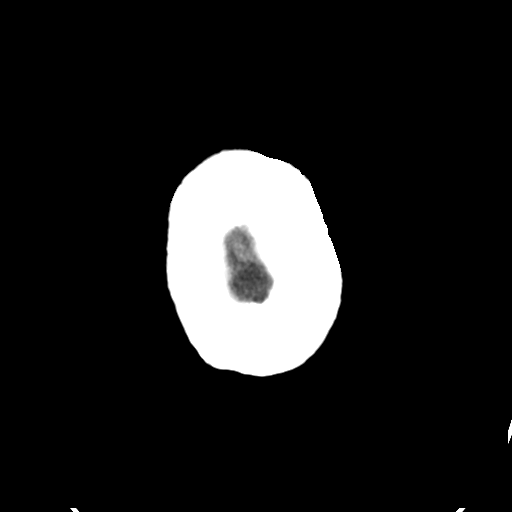

[Series 5: head 3.0 mpr cor · coronal · 0.34mm/px · 3 of 70 slices shown]
[im 24/70  brain]
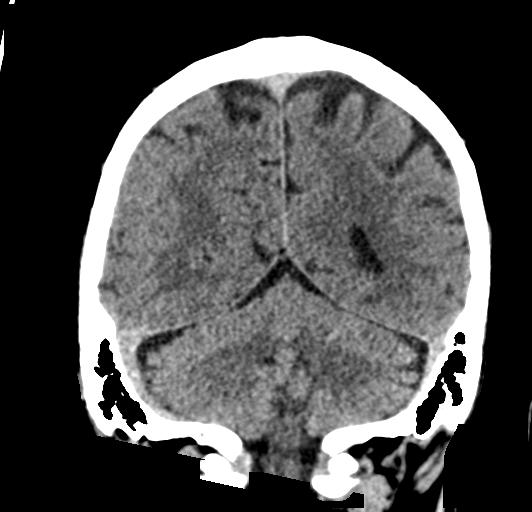
[im 31/70  brain]
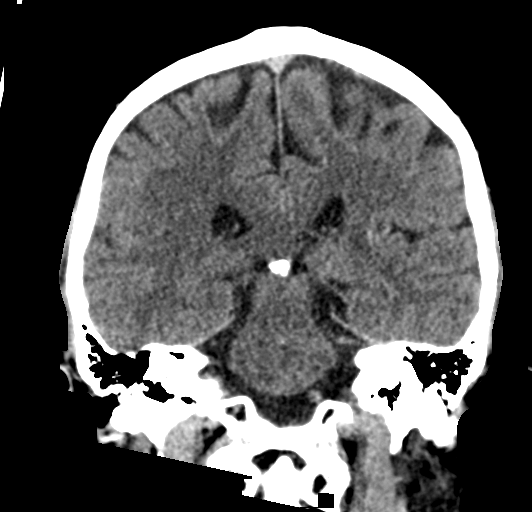
[im 39/70  brain]
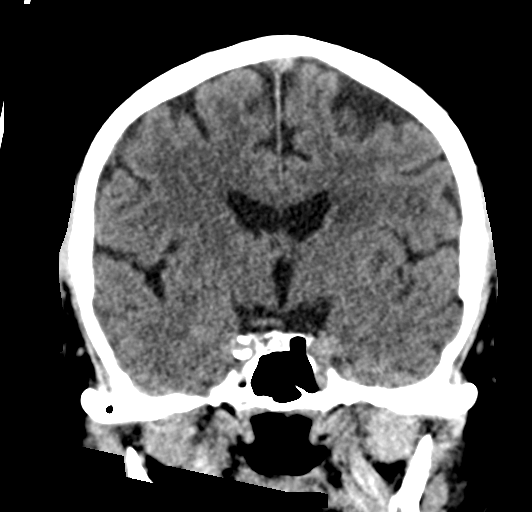

[Series 6: head 3.0 mpr sag · sagittal · 0.35mm/px · 3 of 62 slices shown]
[im 26/62  brain]
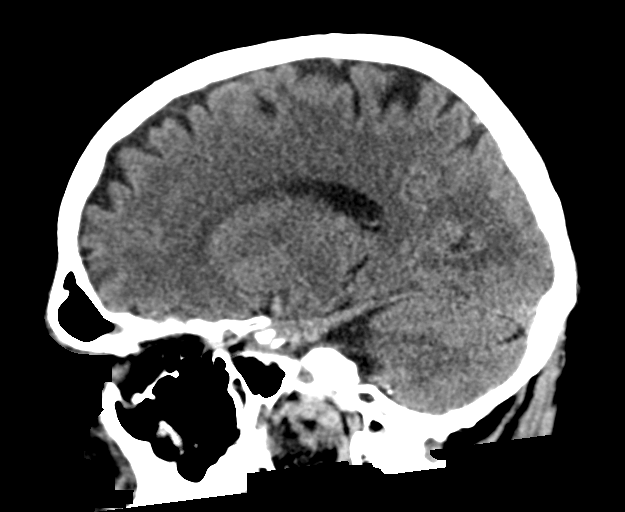
[im 31/62  brain]
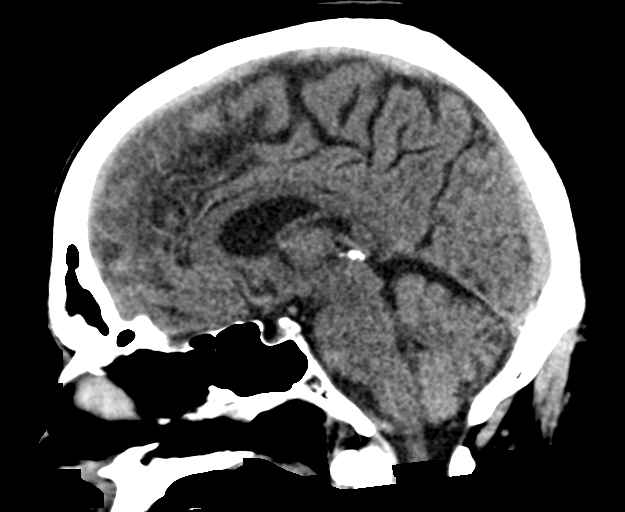
[im 36/62  brain]
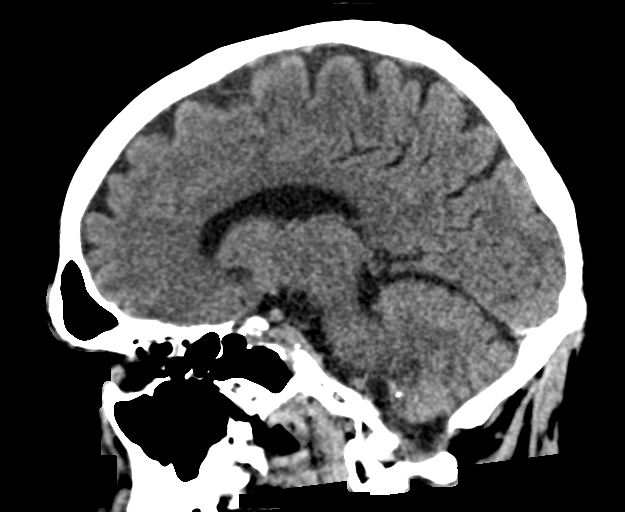

[16 of 47 positions shown; findings below may reference images not displayed]

FINDINGS: Brain: No hemorrhage or intracranial mass. Chronic left cerebellar
lacunar infarct. Chronic lacunar infarcts in the left basal ganglia
and white matter. Age indeterminate infarcts in the left thalamus.
Atrophy. Mild small vessel ischemic change of the white matter.
Stable ventricle size

Vascular: No hyperdense vessels.  Carotid vascular calcification

Skull: Normal. Negative for fracture or focal lesion.

Sinuses/Orbits: Mucosal thickening in the maxillary, ethmoid and
frontal sinuses

Other: None.
IMPRESSION: 1. Age indeterminate hypodensity/possible lacunar infarct left
thalamus.
2. Atrophy and mild small vessel ischemic change of the white
matter. Multifocal chronic appearing infarcts involving the left
basal ganglia and cerebellum.

## 2020-05-03 IMAGING — DX DG CHEST 1V PORT
1 series · 1 of 1 positions shown · non-contrast
Comparison: 11/04/2014

CLINICAL DATA: Fever. Sepsis.

EXAM:
PORTABLE CHEST 1 VIEW

[chest]
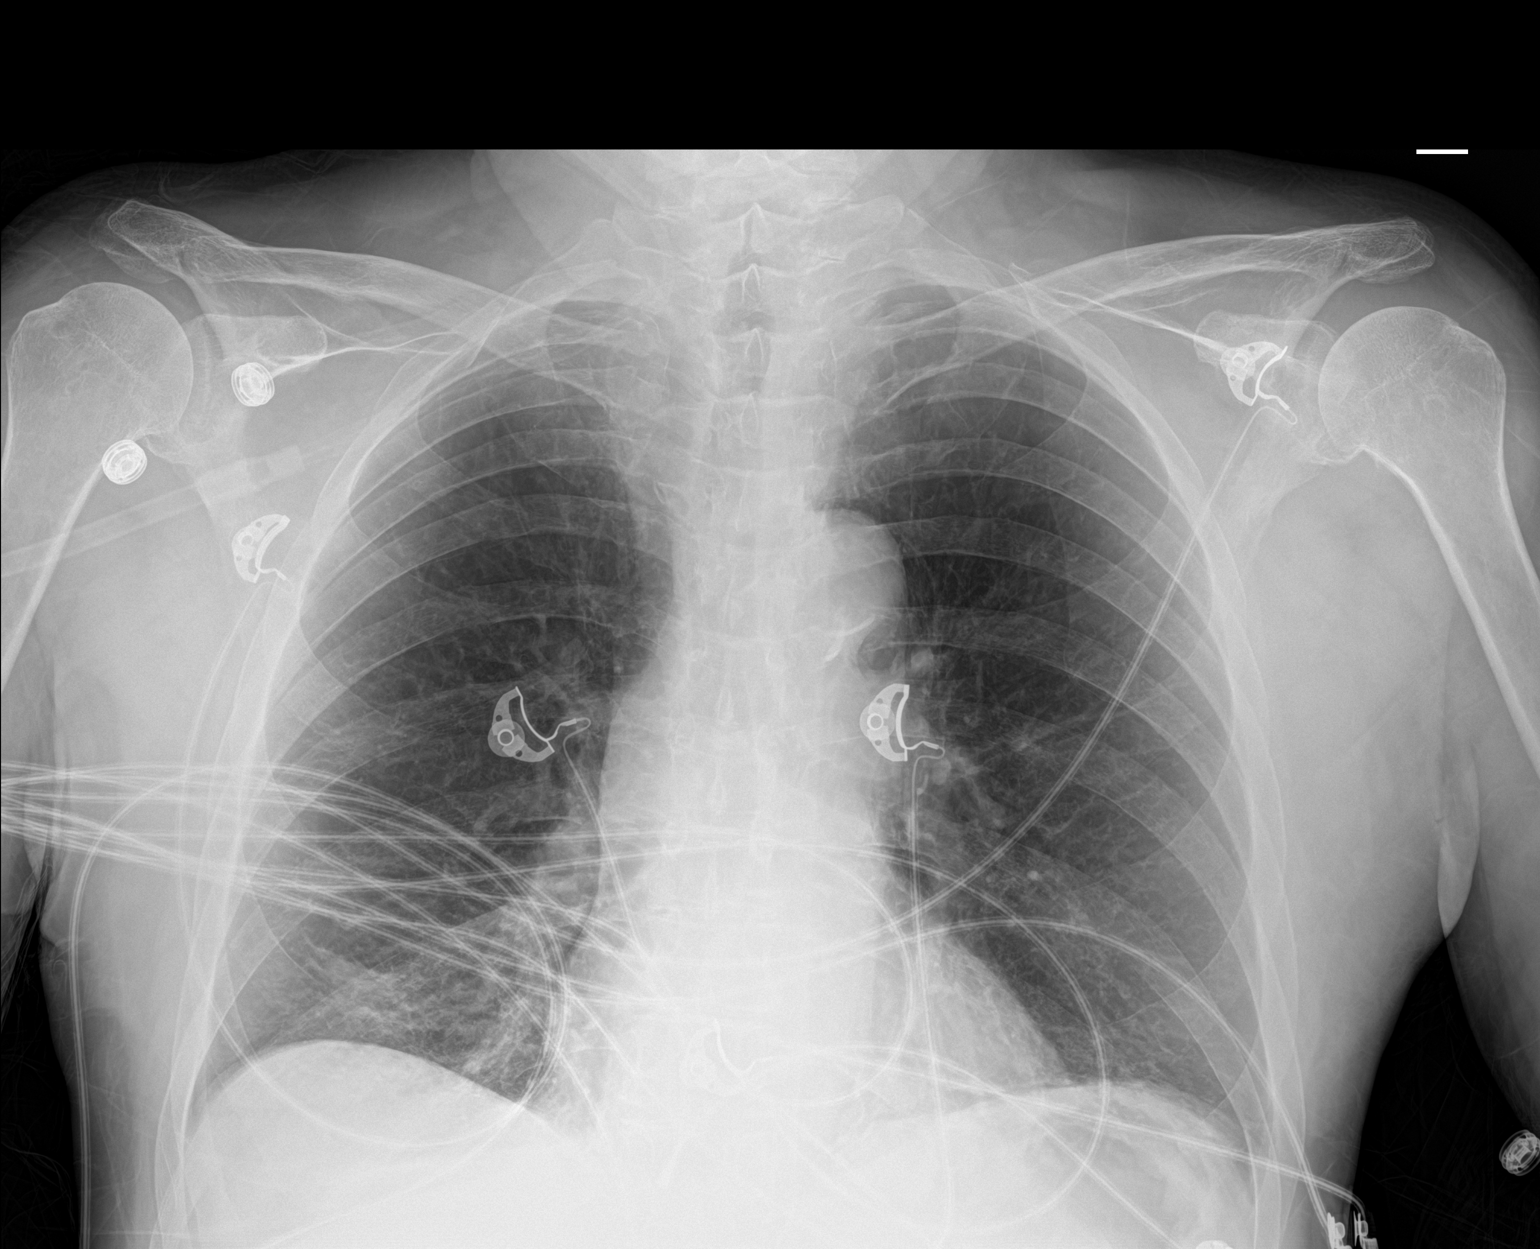

[1 of 1 positions shown; findings below may reference images not displayed]

FINDINGS: Patchy right lung base opacity. Minimal left lower lobe atelectasis.
Normal heart size and mediastinal contours. No pulmonary edema,
pleural effusion, or pneumothorax. No acute osseous abnormalities
are seen.
IMPRESSION: 1. Patchy right lung base opacity, suspicious for pneumonia in the
setting of fever.
2. Minimal left lower lobe atelectasis.

## 2020-05-11 DIAGNOSIS — F331 Major depressive disorder, recurrent, moderate: Secondary | ICD-10-CM | POA: Diagnosis not present

## 2020-05-26 DIAGNOSIS — F331 Major depressive disorder, recurrent, moderate: Secondary | ICD-10-CM | POA: Diagnosis not present

## 2020-06-04 DIAGNOSIS — Z23 Encounter for immunization: Secondary | ICD-10-CM | POA: Diagnosis not present

## 2020-06-09 DIAGNOSIS — F331 Major depressive disorder, recurrent, moderate: Secondary | ICD-10-CM | POA: Diagnosis not present

## 2020-06-09 DIAGNOSIS — F411 Generalized anxiety disorder: Secondary | ICD-10-CM | POA: Diagnosis not present

## 2020-06-16 DIAGNOSIS — F331 Major depressive disorder, recurrent, moderate: Secondary | ICD-10-CM | POA: Diagnosis not present

## 2020-07-13 DIAGNOSIS — F411 Generalized anxiety disorder: Secondary | ICD-10-CM | POA: Diagnosis not present

## 2020-07-13 DIAGNOSIS — F331 Major depressive disorder, recurrent, moderate: Secondary | ICD-10-CM | POA: Diagnosis not present

## 2020-08-05 DIAGNOSIS — F411 Generalized anxiety disorder: Secondary | ICD-10-CM | POA: Diagnosis not present

## 2020-08-05 DIAGNOSIS — F331 Major depressive disorder, recurrent, moderate: Secondary | ICD-10-CM | POA: Diagnosis not present

## 2020-08-11 ENCOUNTER — Ambulatory Visit: Payer: Self-pay

## 2020-08-20 DIAGNOSIS — I679 Cerebrovascular disease, unspecified: Secondary | ICD-10-CM | POA: Diagnosis not present

## 2020-08-20 DIAGNOSIS — R413 Other amnesia: Secondary | ICD-10-CM | POA: Diagnosis not present

## 2020-08-20 DIAGNOSIS — D729 Disorder of white blood cells, unspecified: Secondary | ICD-10-CM | POA: Diagnosis not present

## 2020-08-20 DIAGNOSIS — Z125 Encounter for screening for malignant neoplasm of prostate: Secondary | ICD-10-CM | POA: Diagnosis not present

## 2020-08-20 DIAGNOSIS — J449 Chronic obstructive pulmonary disease, unspecified: Secondary | ICD-10-CM | POA: Diagnosis not present

## 2020-08-20 DIAGNOSIS — E559 Vitamin D deficiency, unspecified: Secondary | ICD-10-CM | POA: Diagnosis not present

## 2020-08-20 DIAGNOSIS — F331 Major depressive disorder, recurrent, moderate: Secondary | ICD-10-CM | POA: Diagnosis not present

## 2020-08-20 DIAGNOSIS — I69359 Hemiplegia and hemiparesis following cerebral infarction affecting unspecified side: Secondary | ICD-10-CM | POA: Diagnosis not present

## 2020-08-20 DIAGNOSIS — I10 Essential (primary) hypertension: Secondary | ICD-10-CM | POA: Diagnosis not present

## 2020-08-20 DIAGNOSIS — H9390 Unspecified disorder of ear, unspecified ear: Secondary | ICD-10-CM | POA: Diagnosis not present

## 2020-08-20 DIAGNOSIS — E7800 Pure hypercholesterolemia, unspecified: Secondary | ICD-10-CM | POA: Diagnosis not present

## 2020-08-20 DIAGNOSIS — Z0000 Encounter for general adult medical examination without abnormal findings: Secondary | ICD-10-CM | POA: Diagnosis not present

## 2020-08-23 DIAGNOSIS — F411 Generalized anxiety disorder: Secondary | ICD-10-CM | POA: Diagnosis not present

## 2020-08-23 DIAGNOSIS — F331 Major depressive disorder, recurrent, moderate: Secondary | ICD-10-CM | POA: Diagnosis not present

## 2020-09-05 ENCOUNTER — Other Ambulatory Visit: Payer: Self-pay | Admitting: Cardiology

## 2020-09-17 DIAGNOSIS — H61813 Exostosis of external canal, bilateral: Secondary | ICD-10-CM | POA: Diagnosis not present

## 2020-09-24 DIAGNOSIS — F411 Generalized anxiety disorder: Secondary | ICD-10-CM | POA: Diagnosis not present

## 2020-09-24 DIAGNOSIS — F331 Major depressive disorder, recurrent, moderate: Secondary | ICD-10-CM | POA: Diagnosis not present

## 2020-09-29 DIAGNOSIS — J449 Chronic obstructive pulmonary disease, unspecified: Secondary | ICD-10-CM | POA: Diagnosis not present

## 2020-09-29 DIAGNOSIS — F331 Major depressive disorder, recurrent, moderate: Secondary | ICD-10-CM | POA: Diagnosis not present

## 2020-09-29 DIAGNOSIS — G4700 Insomnia, unspecified: Secondary | ICD-10-CM | POA: Diagnosis not present

## 2020-09-29 DIAGNOSIS — I2510 Atherosclerotic heart disease of native coronary artery without angina pectoris: Secondary | ICD-10-CM | POA: Diagnosis not present

## 2020-09-29 DIAGNOSIS — I10 Essential (primary) hypertension: Secondary | ICD-10-CM | POA: Diagnosis not present

## 2020-09-29 DIAGNOSIS — E7800 Pure hypercholesterolemia, unspecified: Secondary | ICD-10-CM | POA: Diagnosis not present

## 2020-10-14 DIAGNOSIS — F331 Major depressive disorder, recurrent, moderate: Secondary | ICD-10-CM | POA: Diagnosis not present

## 2020-10-14 DIAGNOSIS — F411 Generalized anxiety disorder: Secondary | ICD-10-CM | POA: Diagnosis not present

## 2020-10-20 DIAGNOSIS — G4700 Insomnia, unspecified: Secondary | ICD-10-CM | POA: Diagnosis not present

## 2020-10-20 DIAGNOSIS — J449 Chronic obstructive pulmonary disease, unspecified: Secondary | ICD-10-CM | POA: Diagnosis not present

## 2020-10-20 DIAGNOSIS — E7800 Pure hypercholesterolemia, unspecified: Secondary | ICD-10-CM | POA: Diagnosis not present

## 2020-10-20 DIAGNOSIS — F324 Major depressive disorder, single episode, in partial remission: Secondary | ICD-10-CM | POA: Diagnosis not present

## 2020-10-20 DIAGNOSIS — I2510 Atherosclerotic heart disease of native coronary artery without angina pectoris: Secondary | ICD-10-CM | POA: Diagnosis not present

## 2020-10-20 DIAGNOSIS — R413 Other amnesia: Secondary | ICD-10-CM | POA: Diagnosis not present

## 2020-10-20 DIAGNOSIS — I10 Essential (primary) hypertension: Secondary | ICD-10-CM | POA: Diagnosis not present

## 2020-10-25 ENCOUNTER — Institutional Professional Consult (permissible substitution): Payer: Medicare Other | Admitting: Neurology

## 2020-10-26 ENCOUNTER — Ambulatory Visit (INDEPENDENT_AMBULATORY_CARE_PROVIDER_SITE_OTHER): Payer: Medicare Other | Admitting: Neurology

## 2020-10-26 ENCOUNTER — Other Ambulatory Visit: Payer: Self-pay

## 2020-10-26 ENCOUNTER — Encounter: Payer: Self-pay | Admitting: Neurology

## 2020-10-26 VITALS — BP 152/84 | HR 62 | Ht 65.0 in | Wt 145.0 lb

## 2020-10-26 DIAGNOSIS — R413 Other amnesia: Secondary | ICD-10-CM | POA: Diagnosis not present

## 2020-10-26 DIAGNOSIS — I6990 Unspecified sequelae of unspecified cerebrovascular disease: Secondary | ICD-10-CM

## 2020-10-26 MED ORDER — ASPIRIN EC 81 MG PO TBEC: 81.0000 mg | DELAYED_RELEASE_TABLET | Freq: Every day | ORAL | 11 refills | Status: AC

## 2020-10-26 NOTE — Progress Notes (Signed)
[facility] [address] [place]. [place] [address] [phone]       OFFICE FOLLOW-UP NOTE  [name] Date of Birth:  1946/01/15 Medical Record Number:  [id number]   HPI:  Virtual video visit 02/19/2019 : Terry Kemp is seen today for virtual video consultation visit for evaluation for new complaints of memory loss.He is a pleasant 74 year old Caucasian male with past medical history of hypertension, hyperlipidemia, coronary artery disease with MI status post stenting in August 2006, peripheral arterial disease status post stenting of her iliac arteries, COPD, tobacco dependence, benign prostatic hypertrophy, osteoarthritis, TIAs and lacunar infarcts in the past.  He states following his last lacunar infarct in 2017 he is noticed memory difficulties which he feels have been slowly progressive.  Patient states that he is having trouble remembering mostly short-term issues.  He is no longer able to care for himself and had to reluctantly moving to live with his ex-wife.  He is having trouble cooking and doing stuff at home.  He tried hiring caregiver but it did not work out.  He can still do a few things around the house for himself and he can look after his own needs but cannot manage house.  He states he gets upset very easily though he has not had any violent outbursts.  He has longstanding history of anxiety depression and is been on multiple medications but feels that is under control.  On inquiry admits to occasional word finding difficulties and trouble completing sentences and having to stop and midsentence.  He denies any headaches, blurred vision, double vision, gait or balance problems.  He has had no recent stroke or TIA symptoms.  He denies any prior history of significant head injury with loss of consciousness or seizures.  He has not had any recent cognitive evaluation or brain imaging study done.  There is no family history of Alzheimer's dementia though  he was adopted he was able to establish contact with his birth mother but he does not know the rest of his family very well Update 04/17/2019 : He returns for follow-up after last visit 2 months ago.  He is accompanied by his ex-wife.  He states he continues to have memory difficulties.  He has not been compliant with his medications and has not been taking them.  He initially said he was not taking his Zoloft but subsequently told me he was taking 100 mg daily.  He has longstanding history of depression and anxiety and was previously seen at [facility] psychiatry and more recently has just followed with primary physician Dr. Harrington Challenger.  He did undergo MRI scan of the brain which had ordered on 03/14/2019 which shows mild changes of small vessel disease and generalized atrophy which appear progressed compared to previous scan but no acute infarct.  I had also ordered lab work for reversible causes of memory loss as well as EEG which have not yet been done.  Patient has no new complaints but states now that he will be willing to take his Zoloft and follow-up with his physician as he wants to get better.  On Mini-Mental status exam today he scored 30/30 but on geriatric depression scale he scored 12 suggestive of moderate to severe depression Update 10/26/2020 : He returns for follow-up after last visit 6 months ago.  Is accompanied by his wife.  He states he had an episode 6 months ago of transient speech difficulties with he had trouble coming up with words and talking.  This lasted about 2 hours.  He waited for it to get better and did not seek medical help.  Patient was started on Zoloft by me at last visit for depression but he did not like the side effects and stopped it.  Patient states he is quite sensitive to different psychoactive medications and has tried Wellbutrin and Chantix in the past.  He is also seen 2 different psychiatrist but did not like them.  He is currently seeing a counselor every 3 weeks and seems  to like him.  Continues to have short-term memory difficulties particularly he is very poor with names and he feels he cannot carry out good conversations with people and hence does not socially fit and does not like to go out.  He also cannot do verbal confrontation and gets tired very easily.  His blood pressure is usually well controlled today it is slightly elevated 152/84.  He is tolerating Plavix well but does have quite easy and frequent bruising. ROS:   14 system review of systems is positive for memory loss, depression, lack of initiative, bruising and all other systems negative  PMH:  Past Medical History:  Diagnosis Date  . Anxiety   . Arthritis   . CAD S/P percutaneous coronary angioplasty 01/2005   ST elevation MI -CATH/PCI-RCA (Cypher DES 3 x 28 - 3.60mm); CATH 2008 FOR INFERIOR ISCHEMIA - 80% dRCA ISR reduced to 40% with NTG - spasm.  . Depression   . Hyperlipidemia   . Hypertension   . Myocardial infarction ([place])    2 strokes  . PAD (peripheral artery disease) ([place]) 2000   bilateral iliac arteries PTA  and  stenting in 04/30/1998; In 2006-subsequent  R Common Iliac (RCIA) 100% ISR, 75 % LCIA -- staged Bifuracation Kissing Stent creating new Aortic Carina.-- Overlapping 7 mm x 30mm & 61mm stents RCIA & kissing 7 mm x 29 mm in LCIA.  . Stroke [facility]) 2013; 08/2015   a) Felt to be a TIA;; b) Imaging confirmed CVA - mild residual R-sided weakness    Social History:  Social History   Socioeconomic History  . Marital status: Married    Spouse name: Hisako  . Number of children: 2  . Years of education: college  . Highest education level: Not on file  Occupational History  . Not on file  Tobacco Use  . Smoking status: Former Smoker    Packs/day: 1.00    Years: 38.00    Pack years: 38.00    Types: Cigarettes    Quit date: 01/01/2017    Years since quitting: 3.8  . Smokeless tobacco: Never Used  Vaping Use  . Vaping Use: Never used  Substance and Sexual Activity  .  Alcohol use: Yes    Alcohol/week: 14.0 standard drinks    Types: 14 Glasses of wine per week  . Drug use: No  . Sexual activity: Not on file  Other Topics Concern  . Not on file  Social History Narrative   Lives with wife   Right handed   Drinks 2-3 cups caffeine daily   Social Determinants of Health   Financial Resource Strain: Not on file  Food Insecurity: Not on file  Transportation Needs: Not on file  Physical Activity: Not on file  Stress: Not on file  Social Connections: Not on file  Intimate Partner Violence: Not on file    Medications:   Current Outpatient Medications on File Prior to Visit  Medication Sig Dispense Refill  .  ALPRAZolam (XANAX) 1 MG tablet Take 0.5 mg by mouth as needed for anxiety or sleep.     Marland Kitchen amLODipine (NORVASC) 10 MG tablet TAKE 1 TABLET BY MOUTH ONCE DAILY (Patient taking differently: Take 10 mg by mouth daily.) 90 tablet 2  . atorvastatin (LIPITOR) 40 MG tablet Take 1 tablet by mouth once daily 60 tablet 0  . Cyanocobalamin (VITAMIN B-12 CR PO) Take by mouth daily. Reported on 08/26/2015    . metoprolol succinate (TOPROL XL) 25 MG 24 hr tablet Take 1 tablet (25 mg total) by mouth daily. 90 tablet 3  . Multiple Vitamin (MULTIVITAMIN) tablet Take 1 tablet by mouth daily.    . ramipril (ALTACE) 10 MG capsule TAKE 1 CAPSULE BY MOUTH DAILY 90 capsule 0  . sertraline (ZOLOFT) 100 MG tablet Take 1.5 tablets (150 mg total) by mouth daily. 45 tablet 1   No current facility-administered medications on file prior to visit.    Allergies:   Allergies  Allergen Reactions  . Zoloft [Sertraline Hcl] Other (See Comments)    Psychosis per patient.    Physical Exam General: well developed, well nourished elderly Caucasian male, seated, in no evident distress Head: head normocephalic and atraumatic.  Neck: supple with no carotid or supraclavicular bruits Cardiovascular: regular rate and rhythm, no murmurs Musculoskeletal: no deformity Skin:  no rash but  bilateral forearms scattered petichiae Vascular:  Normal pulses all extremities Vitals:   10/26/20 1350  BP: (!) 152/84  Pulse: 62   Neurologic Exam Mental Status: Awake and fully alert. Oriented to place and time. Recent and remote memory intact. Attention span, concentration and fund of knowledge appropriate. Mood and affect appear depressed.  Diminished recall.Mini-Mental status exam not done but has depressive affect. Cranial Nerves: Fundoscopic exam not done. Pupils equal, briskly reactive to light. Extraocular movements full without nystagmus. Visual fields full to confrontation. Hearing intact. Facial sensation intact. Face, tongue, palate moves normally and symmetrically.  Motor: Normal bulk and tone. Normal strength in all tested extremity muscles. Sensory.: intact to touch ,pinprick .position and vibratory sensation.  Coordination: Rapid alternating movements normal in all extremities. Finger-to-nose and heel-to-shin performed accurately bilaterally. Gait and Station: Arises from chair without difficulty. Stance is normal. Gait demonstrates normal stride length and balance . Able to heel, toe and tandem walk with moderate difficulty.  Reflexes: 1+ and symmetric. Toes downgoing.       ASSESSMENT:75 year old Caucasian male with past medical history of MRI negative brainstem infarct in 2012, TIA in 2013 and silent left internal capsule infarct and left subcortical infarct in December 2016 from small vessel disease with vascular risk factors of obesity, hypertension, hyperlipidemia and cerebrovascular disease and remote smoking.  He is complaining of memory difficulties following his last stroke which have been slowly progressive likely mild cognitive impairment with underlying suboptimally treated depression which appear unchanged.  New episode of transient speech and expressive language difficulties 6 months ago possibly left hemispheric TIA.     PLAN:I had a long discussion with the  patient and his wife regarding his episode of transient speech disturbance 6 months ago which probably represents a TIA.  He is having a lot of bruising on aspirin and Plavix and so recommend we discontinue Plavix and stay on aspirin alone.  Maintain aggressive risk factor modification with strict control of hypertension with blood pressure goal below 130/90, lipids with LDL cholesterol goal below 70 mg percent and diabetes with hemoglobin A1c goal below 6.5%.  He was also encouraged to see  his counselor to manage his depression as well as see a psychiatrist and seek alternative medications versus [place] treatment for his refractory depression.  He will return for follow-up in the future in 6 months with my nurse practitioner Janett Billow or call earlier if necessary. Greater than 50% of time during this 25 minute visit was spent on counseling,explanation of diagnosis, planning of further management, discussion with patient and family and coordination of care Antony Contras, MD  [facility] [address] [place], [place] [address]  Phone [phone] Fax [phone] Note: This document was prepared with digital dictation and possible smart phrase technology. Any transcriptional errors that result from this process are unintentional

## 2020-10-26 NOTE — Patient Instructions (Signed)
I had a long discussion with the patient and his wife regarding his episode of transient speech disturbance 6 months ago which probably represents a TIA.  He is having a lot of bruising on aspirin and Plavix and so recommend we discontinue Plavix and stay on aspirin alone.  Maintain aggressive risk factor modification with strict control of hypertension with blood pressure goal below 130/90, lipids with LDL cholesterol goal below 70 mg percent and diabetes with hemoglobin A1c goal below 6.5%.  He was also encouraged to see his counselor to manage his depression as well as see a psychiatrist and seek alternative medications versus [place] treatment for his refractory depression.  He will return for follow-up in the future in 6 months with my nurse practitioner Janett Billow or call earlier if necessary.

## 2020-11-04 DIAGNOSIS — F331 Major depressive disorder, recurrent, moderate: Secondary | ICD-10-CM | POA: Diagnosis not present

## 2020-11-04 DIAGNOSIS — F411 Generalized anxiety disorder: Secondary | ICD-10-CM | POA: Diagnosis not present

## 2020-11-16 DIAGNOSIS — F331 Major depressive disorder, recurrent, moderate: Secondary | ICD-10-CM | POA: Diagnosis not present

## 2020-11-16 DIAGNOSIS — E7800 Pure hypercholesterolemia, unspecified: Secondary | ICD-10-CM | POA: Diagnosis not present

## 2020-11-16 DIAGNOSIS — F324 Major depressive disorder, single episode, in partial remission: Secondary | ICD-10-CM | POA: Diagnosis not present

## 2020-11-16 DIAGNOSIS — J449 Chronic obstructive pulmonary disease, unspecified: Secondary | ICD-10-CM | POA: Diagnosis not present

## 2020-11-16 DIAGNOSIS — I10 Essential (primary) hypertension: Secondary | ICD-10-CM | POA: Diagnosis not present

## 2020-11-16 DIAGNOSIS — G4700 Insomnia, unspecified: Secondary | ICD-10-CM | POA: Diagnosis not present

## 2020-11-16 DIAGNOSIS — I2510 Atherosclerotic heart disease of native coronary artery without angina pectoris: Secondary | ICD-10-CM | POA: Diagnosis not present

## 2020-11-24 ENCOUNTER — Telehealth: Payer: Self-pay | Admitting: Neurology

## 2020-11-24 ENCOUNTER — Institutional Professional Consult (permissible substitution): Payer: Medicare Other | Admitting: Neurology

## 2020-11-24 DIAGNOSIS — F332 Major depressive disorder, recurrent severe without psychotic features: Secondary | ICD-10-CM | POA: Diagnosis not present

## 2020-11-24 NOTE — Telephone Encounter (Signed)
Is this something you would recommend and right a letter for?

## 2020-11-24 NOTE — Telephone Encounter (Signed)
Pt called stating that his doctor has recommended [facility] but informed him that he would need the ok from his neurologist first. Pt states they would need a faxed letter stating that it is ok for him to receive this therapy. Letter needs to be faxed to [facility] at [phone] Please advise.

## 2020-11-24 NOTE — Telephone Encounter (Signed)
Ok to do letter

## 2020-11-25 ENCOUNTER — Encounter: Payer: Self-pay | Admitting: *Deleted

## 2020-11-25 NOTE — Telephone Encounter (Signed)
Letter printed/awaiting MD signature.

## 2020-11-25 NOTE — Telephone Encounter (Signed)
Called pt and informed him that letter has been sent. He verbalized understanding and appreciation.

## 2020-11-25 NOTE — Telephone Encounter (Signed)
Faxed signed letter to # below, received fax confirmation.

## 2020-12-01 DIAGNOSIS — F332 Major depressive disorder, recurrent severe without psychotic features: Secondary | ICD-10-CM | POA: Diagnosis not present

## 2020-12-02 DIAGNOSIS — F332 Major depressive disorder, recurrent severe without psychotic features: Secondary | ICD-10-CM | POA: Diagnosis not present

## 2020-12-03 DIAGNOSIS — F332 Major depressive disorder, recurrent severe without psychotic features: Secondary | ICD-10-CM | POA: Diagnosis not present

## 2020-12-07 DIAGNOSIS — F332 Major depressive disorder, recurrent severe without psychotic features: Secondary | ICD-10-CM | POA: Diagnosis not present

## 2020-12-08 DIAGNOSIS — F332 Major depressive disorder, recurrent severe without psychotic features: Secondary | ICD-10-CM | POA: Diagnosis not present

## 2020-12-09 DIAGNOSIS — F332 Major depressive disorder, recurrent severe without psychotic features: Secondary | ICD-10-CM | POA: Diagnosis not present

## 2020-12-10 DIAGNOSIS — F332 Major depressive disorder, recurrent severe without psychotic features: Secondary | ICD-10-CM | POA: Diagnosis not present

## 2020-12-13 DIAGNOSIS — F332 Major depressive disorder, recurrent severe without psychotic features: Secondary | ICD-10-CM | POA: Diagnosis not present

## 2020-12-14 DIAGNOSIS — F332 Major depressive disorder, recurrent severe without psychotic features: Secondary | ICD-10-CM | POA: Diagnosis not present

## 2020-12-15 DIAGNOSIS — F332 Major depressive disorder, recurrent severe without psychotic features: Secondary | ICD-10-CM | POA: Diagnosis not present

## 2020-12-16 DIAGNOSIS — F332 Major depressive disorder, recurrent severe without psychotic features: Secondary | ICD-10-CM | POA: Diagnosis not present

## 2020-12-17 DIAGNOSIS — F332 Major depressive disorder, recurrent severe without psychotic features: Secondary | ICD-10-CM | POA: Diagnosis not present

## 2020-12-20 DIAGNOSIS — I10 Essential (primary) hypertension: Secondary | ICD-10-CM | POA: Diagnosis not present

## 2020-12-20 DIAGNOSIS — F331 Major depressive disorder, recurrent, moderate: Secondary | ICD-10-CM | POA: Diagnosis not present

## 2020-12-20 DIAGNOSIS — G4700 Insomnia, unspecified: Secondary | ICD-10-CM | POA: Diagnosis not present

## 2020-12-20 DIAGNOSIS — I2510 Atherosclerotic heart disease of native coronary artery without angina pectoris: Secondary | ICD-10-CM | POA: Diagnosis not present

## 2020-12-20 DIAGNOSIS — F324 Major depressive disorder, single episode, in partial remission: Secondary | ICD-10-CM | POA: Diagnosis not present

## 2020-12-20 DIAGNOSIS — J449 Chronic obstructive pulmonary disease, unspecified: Secondary | ICD-10-CM | POA: Diagnosis not present

## 2020-12-20 DIAGNOSIS — F332 Major depressive disorder, recurrent severe without psychotic features: Secondary | ICD-10-CM | POA: Diagnosis not present

## 2020-12-20 DIAGNOSIS — E7800 Pure hypercholesterolemia, unspecified: Secondary | ICD-10-CM | POA: Diagnosis not present

## 2020-12-21 DIAGNOSIS — F332 Major depressive disorder, recurrent severe without psychotic features: Secondary | ICD-10-CM | POA: Diagnosis not present

## 2020-12-22 DIAGNOSIS — F332 Major depressive disorder, recurrent severe without psychotic features: Secondary | ICD-10-CM | POA: Diagnosis not present

## 2020-12-23 DIAGNOSIS — F332 Major depressive disorder, recurrent severe without psychotic features: Secondary | ICD-10-CM | POA: Diagnosis not present

## 2020-12-24 DIAGNOSIS — F332 Major depressive disorder, recurrent severe without psychotic features: Secondary | ICD-10-CM | POA: Diagnosis not present

## 2020-12-27 DIAGNOSIS — F332 Major depressive disorder, recurrent severe without psychotic features: Secondary | ICD-10-CM | POA: Diagnosis not present

## 2020-12-28 DIAGNOSIS — F332 Major depressive disorder, recurrent severe without psychotic features: Secondary | ICD-10-CM | POA: Diagnosis not present

## 2020-12-29 DIAGNOSIS — F332 Major depressive disorder, recurrent severe without psychotic features: Secondary | ICD-10-CM | POA: Diagnosis not present

## 2020-12-30 DIAGNOSIS — F332 Major depressive disorder, recurrent severe without psychotic features: Secondary | ICD-10-CM | POA: Diagnosis not present

## 2020-12-31 DIAGNOSIS — F332 Major depressive disorder, recurrent severe without psychotic features: Secondary | ICD-10-CM | POA: Diagnosis not present

## 2021-01-03 DIAGNOSIS — F332 Major depressive disorder, recurrent severe without psychotic features: Secondary | ICD-10-CM | POA: Diagnosis not present

## 2021-01-04 DIAGNOSIS — F331 Major depressive disorder, recurrent, moderate: Secondary | ICD-10-CM | POA: Diagnosis not present

## 2021-01-04 DIAGNOSIS — F332 Major depressive disorder, recurrent severe without psychotic features: Secondary | ICD-10-CM | POA: Diagnosis not present

## 2021-01-05 DIAGNOSIS — L089 Local infection of the skin and subcutaneous tissue, unspecified: Secondary | ICD-10-CM | POA: Diagnosis not present

## 2021-01-05 DIAGNOSIS — R5383 Other fatigue: Secondary | ICD-10-CM | POA: Diagnosis not present

## 2021-01-05 DIAGNOSIS — L03032 Cellulitis of left toe: Secondary | ICD-10-CM | POA: Diagnosis not present

## 2021-01-05 DIAGNOSIS — F332 Major depressive disorder, recurrent severe without psychotic features: Secondary | ICD-10-CM | POA: Diagnosis not present

## 2021-01-06 DIAGNOSIS — F332 Major depressive disorder, recurrent severe without psychotic features: Secondary | ICD-10-CM | POA: Diagnosis not present

## 2021-01-07 DIAGNOSIS — F332 Major depressive disorder, recurrent severe without psychotic features: Secondary | ICD-10-CM | POA: Diagnosis not present

## 2021-01-10 DIAGNOSIS — F332 Major depressive disorder, recurrent severe without psychotic features: Secondary | ICD-10-CM | POA: Diagnosis not present

## 2021-01-11 DIAGNOSIS — F332 Major depressive disorder, recurrent severe without psychotic features: Secondary | ICD-10-CM | POA: Diagnosis not present

## 2021-01-12 DIAGNOSIS — F332 Major depressive disorder, recurrent severe without psychotic features: Secondary | ICD-10-CM | POA: Diagnosis not present

## 2021-01-14 DIAGNOSIS — E7800 Pure hypercholesterolemia, unspecified: Secondary | ICD-10-CM | POA: Diagnosis not present

## 2021-01-14 DIAGNOSIS — F324 Major depressive disorder, single episode, in partial remission: Secondary | ICD-10-CM | POA: Diagnosis not present

## 2021-01-14 DIAGNOSIS — F331 Major depressive disorder, recurrent, moderate: Secondary | ICD-10-CM | POA: Diagnosis not present

## 2021-01-14 DIAGNOSIS — G4700 Insomnia, unspecified: Secondary | ICD-10-CM | POA: Diagnosis not present

## 2021-01-14 DIAGNOSIS — I2510 Atherosclerotic heart disease of native coronary artery without angina pectoris: Secondary | ICD-10-CM | POA: Diagnosis not present

## 2021-01-14 DIAGNOSIS — J449 Chronic obstructive pulmonary disease, unspecified: Secondary | ICD-10-CM | POA: Diagnosis not present

## 2021-01-14 DIAGNOSIS — F332 Major depressive disorder, recurrent severe without psychotic features: Secondary | ICD-10-CM | POA: Diagnosis not present

## 2021-01-14 DIAGNOSIS — I10 Essential (primary) hypertension: Secondary | ICD-10-CM | POA: Diagnosis not present

## 2021-01-17 DIAGNOSIS — F332 Major depressive disorder, recurrent severe without psychotic features: Secondary | ICD-10-CM | POA: Diagnosis not present

## 2021-01-19 DIAGNOSIS — F332 Major depressive disorder, recurrent severe without psychotic features: Secondary | ICD-10-CM | POA: Diagnosis not present

## 2021-01-21 DIAGNOSIS — F332 Major depressive disorder, recurrent severe without psychotic features: Secondary | ICD-10-CM | POA: Diagnosis not present

## 2021-01-24 DIAGNOSIS — F332 Major depressive disorder, recurrent severe without psychotic features: Secondary | ICD-10-CM | POA: Diagnosis not present

## 2021-01-26 DIAGNOSIS — F332 Major depressive disorder, recurrent severe without psychotic features: Secondary | ICD-10-CM | POA: Diagnosis not present

## 2021-02-23 DIAGNOSIS — E7800 Pure hypercholesterolemia, unspecified: Secondary | ICD-10-CM | POA: Diagnosis not present

## 2021-02-23 DIAGNOSIS — F331 Major depressive disorder, recurrent, moderate: Secondary | ICD-10-CM | POA: Diagnosis not present

## 2021-02-23 DIAGNOSIS — G4700 Insomnia, unspecified: Secondary | ICD-10-CM | POA: Diagnosis not present

## 2021-02-23 DIAGNOSIS — F324 Major depressive disorder, single episode, in partial remission: Secondary | ICD-10-CM | POA: Diagnosis not present

## 2021-02-23 DIAGNOSIS — J449 Chronic obstructive pulmonary disease, unspecified: Secondary | ICD-10-CM | POA: Diagnosis not present

## 2021-02-23 DIAGNOSIS — I10 Essential (primary) hypertension: Secondary | ICD-10-CM | POA: Diagnosis not present

## 2021-02-23 DIAGNOSIS — I2510 Atherosclerotic heart disease of native coronary artery without angina pectoris: Secondary | ICD-10-CM | POA: Diagnosis not present

## 2021-03-09 DIAGNOSIS — F332 Major depressive disorder, recurrent severe without psychotic features: Secondary | ICD-10-CM | POA: Diagnosis not present

## 2021-03-25 DIAGNOSIS — G4700 Insomnia, unspecified: Secondary | ICD-10-CM | POA: Diagnosis not present

## 2021-03-25 DIAGNOSIS — I2510 Atherosclerotic heart disease of native coronary artery without angina pectoris: Secondary | ICD-10-CM | POA: Diagnosis not present

## 2021-03-25 DIAGNOSIS — I10 Essential (primary) hypertension: Secondary | ICD-10-CM | POA: Diagnosis not present

## 2021-03-25 DIAGNOSIS — E7800 Pure hypercholesterolemia, unspecified: Secondary | ICD-10-CM | POA: Diagnosis not present

## 2021-03-25 DIAGNOSIS — J449 Chronic obstructive pulmonary disease, unspecified: Secondary | ICD-10-CM | POA: Diagnosis not present

## 2021-03-25 DIAGNOSIS — F331 Major depressive disorder, recurrent, moderate: Secondary | ICD-10-CM | POA: Diagnosis not present

## 2021-03-25 DIAGNOSIS — F324 Major depressive disorder, single episode, in partial remission: Secondary | ICD-10-CM | POA: Diagnosis not present

## 2021-04-05 DIAGNOSIS — F331 Major depressive disorder, recurrent, moderate: Secondary | ICD-10-CM | POA: Diagnosis not present

## 2021-04-25 ENCOUNTER — Ambulatory Visit (INDEPENDENT_AMBULATORY_CARE_PROVIDER_SITE_OTHER): Payer: Medicare Other | Admitting: Adult Health

## 2021-04-25 ENCOUNTER — Encounter: Payer: Self-pay | Admitting: Adult Health

## 2021-04-25 VITALS — BP 122/86 | HR 57 | Ht 66.0 in | Wt 145.0 lb

## 2021-04-25 DIAGNOSIS — F331 Major depressive disorder, recurrent, moderate: Secondary | ICD-10-CM

## 2021-04-25 DIAGNOSIS — I6990 Unspecified sequelae of unspecified cerebrovascular disease: Secondary | ICD-10-CM

## 2021-04-25 DIAGNOSIS — G3184 Mild cognitive impairment, so stated: Secondary | ICD-10-CM | POA: Diagnosis not present

## 2021-04-25 NOTE — Progress Notes (Signed)
[facility] [address] [place]. [place] [address] [phone]       OFFICE FOLLOW-UP NOTE  Terry Kemp Date of Birth:  1946-09-01 Medical Record Number:  [id number]    Chief Complaint  Patient presents with   Follow-up    Rm 2 alone. Here for 6 month f/u. Pt reports he has not been doing well. Sates he has moved back with ex-wife. Reports short term memory has declined since last visit.       HPI:  Virtual video visit 02/19/2019 : Terry Kemp is seen today for virtual video consultation visit for evaluation for new complaints of memory loss.He is a pleasant 75 year old Caucasian male with past medical history of hypertension, hyperlipidemia, coronary artery disease with MI status post stenting in August 2006, peripheral arterial disease status post stenting of her iliac arteries, COPD, tobacco dependence, benign prostatic hypertrophy, osteoarthritis, TIAs and lacunar infarcts in the past.  He states following his last lacunar infarct in 2017 he is noticed memory difficulties which he feels have been slowly progressive.  Patient states that he is having trouble remembering mostly short-term issues.  He is no longer able to care for himself and had to reluctantly moving to live with his ex-wife.  He is having trouble cooking and doing stuff at home.  He tried hiring caregiver but it did not work out.  He can still do a few things around the house for himself and he can look after his own needs but cannot manage house.  He states he gets upset very easily though he has not had any violent outbursts.  He has longstanding history of anxiety depression and is been on multiple medications but feels that is under control.  On inquiry admits to occasional word finding difficulties and trouble completing sentences and having to stop and midsentence.  He denies any headaches, blurred vision, double vision, gait or balance problems.  He has had no recent stroke or TIA  symptoms.  He denies any prior history of significant head injury with loss of consciousness or seizures.  He has not had any recent cognitive evaluation or brain imaging study done.  There is no family history of Alzheimer's dementia though he was adopted he was able to establish contact with his birth mother but he does not know the rest of his family very well  Update 04/17/2019 : He returns for follow-up after last visit 2 months ago.  He is accompanied by his ex-wife.  He states he continues to have memory difficulties.  He has not been compliant with his medications and has not been taking them.  He initially said he was not taking his Zoloft but subsequently told me he was taking 100 mg daily.  He has longstanding history of depression and anxiety and was previously seen at [facility] psychiatry and more recently has just followed with primary physician Dr. Harrington Challenger.  He did undergo MRI scan of the brain which had ordered on 03/14/2019 which shows mild changes of small vessel disease and generalized atrophy which appear progressed compared to previous scan but no acute infarct.  I had also ordered lab work for reversible causes of memory loss as well as EEG which have not yet been done.  Patient has no new complaints but states now that he will be willing to take his Zoloft and follow-up with his physician as he wants to get better.  On Mini-Mental status exam today he scored 30/30 but on geriatric depression scale  he scored 12 suggestive of moderate to severe depression  Update 10/26/2020 : He returns for follow-up after last visit 6 months ago.  Is accompanied by his wife.  He states he had an episode 6 months ago of transient speech difficulties with he had trouble coming up with words and talking.  This lasted about 2 hours.  He waited for it to get better and did not seek medical help.  Patient was started on Zoloft by me at last visit for depression but he did not like the side effects and stopped it.   Patient states he is quite sensitive to different psychoactive medications and has tried Wellbutrin and Chantix in the past.  He is also seen 2 different psychiatrist but did not like them.  He is currently seeing a counselor every 3 weeks and seems to like him.  Continues to have short-term memory difficulties particularly he is very poor with names and he feels he cannot carry out good conversations with people and hence does not socially fit and does not like to go out.  He also cannot do verbal confrontation and gets tired very easily.  His blood pressure is usually well controlled today it is slightly elevated 152/84.  He is tolerating Plavix well but does have quite easy and frequent bruising.  Update 04/25/2021 JM: Returns for 21-monthfollow-up unaccompanied.  Reports continued memory decline since prior visit. Greatest concern is with short term memory. Maintains ADLs independently but needs assistance with some IADLs. Living with ex-wife. He avoids socialization due to feeling overwhelmed.  Sedentary lifestyle watching TV majority of the day.  He does not participate in any form of memory exercises.  He will occasionally take a short walk.  [facility] - did not see much benefit. Not currently taking mediations for depression due to side effects but does participate in monthly counseling sessions which he feels is beneficial. He feels self medicating with glass of wine nightly helps with anxiety. He also uses nicotine longezes daily. He continues to drive without difficulty.  Reports compliance on aspirin and atorvastatin without side effects.  Blood pressure today 122/86.  No further concerns at this time.      ROS:   14 system review of systems is positive for those listed in HPI and all other systems negative  PMH:  Past Medical History:  Diagnosis Date   Anxiety    Arthritis    CAD S/P percutaneous coronary angioplasty 01/2005    ST elevation MI -CATH/PCI-RCA (Cypher DES 3 x 28 - 3.275m; CATH 2008 FOR INFERIOR ISCHEMIA - 80% dRCA ISR reduced to 40% with NTG - spasm.   Depression    Hyperlipidemia    Hypertension    [facility]   [facility]   PAD (peripheral artery disease) ([facility]   bilateral iliac arteries PTA  and  stenting in 04/30/1998; In 2006-subsequent  R Common Iliac (RCIA) 100% ISR, 75 % LCIA -- staged Bifuracation Kissing Stent creating new Aortic Carina.-- Overlapping 7 mm x 3982m 58m89ments RCIA & kissing 7 mm x 29 mm in LCIA.   [facility]; [facility]   a) Felt to be a TIA;; b) Imaging confirmed CVA - mild residual R-sided weakness    Social History:  Social History   Socioeconomic History   Marital status: Married    Spouse name: Hisako   Number of children: 2   Years of education: college  Highest education level: Not on file  Occupational History   Not on file  Tobacco Use   Smoking status: Former    Packs/day: 1.00    Years: 38.00    Pack years: 38.00    Types: Cigarettes    Quit date: 01/01/2017    Years since quitting: 4.3   Smokeless tobacco: Never  Vaping Use   Vaping Use: Never used  Substance and Sexual Activity   Alcohol use: Yes    Alcohol/week: 14.0 standard drinks    Types: 14 Glasses of wine per week   Drug use: No   Sexual activity: Not on file  Other Topics Concern   Not on file  Social History Narrative   Lives with wife   Right handed   Drinks 2-3 cups caffeine daily   Social Determinants of Health   Financial Resource Strain: Not on file  Food Insecurity: Not on file  Transportation Needs: Not on file  Physical Activity: Not on file  Stress: Not on file  Social Connections: Not on file  Intimate Partner Violence: Not on file    Medications:   Current Outpatient Medications on File Prior to Visit  Medication Sig Dispense Refill   amLODipine (NORVASC) 10 MG tablet TAKE 1 TABLET BY MOUTH ONCE DAILY (Patient taking differently:  Take 10 mg by mouth daily.) 90 tablet 2   aspirin EC 81 MG tablet Take 1 tablet (81 mg total) by mouth daily. Swallow whole. 30 tablet 11   atorvastatin (LIPITOR) 40 MG tablet Take 1 tablet by mouth once daily 60 tablet 0   Cyanocobalamin (VITAMIN B-12 CR PO) Take by mouth daily. Reported on 08/26/2015     metoprolol succinate (TOPROL XL) 25 MG 24 hr tablet Take 1 tablet (25 mg total) by mouth daily. 90 tablet 3   Multiple Vitamin (MULTIVITAMIN) tablet Take 1 tablet by mouth daily.     nicotine polacrilex (NICOTINE MINI) 2 MG lozenge Take 2 mg by mouth as needed for smoking cessation. Reports takeing12 per day     ramipril (ALTACE) 10 MG capsule TAKE 1 CAPSULE BY MOUTH DAILY 90 capsule 0   ALPRAZolam (XANAX) 1 MG tablet Take 0.5 mg by mouth as needed for anxiety or sleep.  (Patient not taking: Reported on 04/25/2021)     No current facility-administered medications on file prior to visit.    Allergies:   Allergies  Allergen Reactions   Zoloft [Sertraline Hcl] Other (See Comments)    Psychosis per patient.    Physical Exam Today's Vitals   04/25/21 1447  BP: 122/86  Pulse: (!) 57  SpO2: 97%  Weight: 145 lb (65.8 kg)  Height: '5\' 6"'$  (1.676 m)   Body mass index is 23.4 kg/m.  General: well developed, well nourished elderly Caucasian male, seated, in no evident distress Head: head normocephalic and atraumatic.  Neck: supple with no carotid or supraclavicular bruits Cardiovascular: regular rate and rhythm, no murmurs Musculoskeletal: no deformity Vascular:  Normal pulses all extremities  Neurologic Exam Mental Status: Awake and fully alert.  Fluent speech and language.  Oriented to place and time. Recent memory impaired and remote memory intact. Attention span, concentration and fund of knowledge appropriate during visit. Mood and affect appear depressed.  Cranial Nerves: Pupils equal, briskly reactive to light. Extraocular movements full without nystagmus. Visual fields full to  confrontation. Hearing intact. Facial sensation intact. Face, tongue, palate moves normally and symmetrically.  Motor: Normal bulk and tone. Normal strength in all tested extremity muscles.  Sensory.: intact to touch ,pinprick .position and vibratory sensation.  Coordination: Rapid alternating movements normal in all extremities. Finger-to-nose and heel-to-shin performed accurately bilaterally. Gait and Station: Arises from chair without difficulty. Stance is normal. Gait demonstrates normal stride length and balance . Able to heel, toe and tandem walk with moderate difficulty.  Reflexes: 1+ and symmetric. Toes downgoing.       ASSESSMENT/PLAN:75 year old Caucasian male with past medical history of MRI negative brainstem infarct in 2012, TIA in 2013, silent left internal capsule infarct and left subcortical infarct in December 2016 from small vessel disease and likely left hemispheric TIA 04/2020 with transient speech and language difficulties. Vascular risk factors of obesity, hypertension, hyperlipidemia and cerebrovascular disease and remote smoking.  Continued complaint of cognitive decline since prior stroke.     Prolonged discussion regarding importance of depression management and routine counseling sessions as suboptimally treated depression can be contributing to cognitive complaints.  Also discussed importance of memory exercises as well as routine physical activity, socialization, management of stroke risk factors, healthy diet and adequate sleep. Declines interest in neurocognitive evaluation No indication for medication management such as with Aricept or Namenda He declines interest in further medication treatment for his depression due to prior intolerances Ongoing memory and cognitive monitoring per PCP    CC:  [facility] provider: Dr. Wyatt Portela, Dwyane Luo, MD   I spent 36 minutes of face-to-face and non-face-to-face time with patient.  This included previsit chart review, lab  review, study review, electronic health record documentation, patient education and prolonged discussion regarding history of prior strokes, secondary stroke prevention measures and importance of managing stroke risk factors, cognitive complaints and further education as noted above, and answered all other questions to patient's satisfaction  Frann Rider, [facility]  [facility] [address] [place], [place] [address]  Phone [phone] Fax [phone] Note: This document was prepared with digital dictation and possible smart phrase technology. Any transcriptional errors that result from this process are unintentional.

## 2021-04-25 NOTE — Patient Instructions (Addendum)
Your Plan:  Very important to increase your daily activity both physical and mental exercises as there is a good chance you will continue to see memory loss if you do not make these changes      Thank you for coming to see [place] at [facility]. I hope we have been able to provide you high quality care today.  You may receive a patient satisfaction survey over the next few weeks. We would appreciate your feedback and comments so that we may continue to improve ourselves and the health of our patients.     Mild Neurocognitive Disorder Mild neurocognitive disorder, formerly known as mild cognitive impairment, is a disorder in which memory does not work as well as it should. This disorder may also cause problems with other mental functions, including thought, communication, behavior, and completion of tasks. These problems can be noticed and measured, but they usually do not interfere with daily activities or theability to live independently. Mild neurocognitive disorder typically develops after 73 years of age, but it can also develop at younger ages. It is not as serious as major neurocognitive disorder, also known as dementia, but it may be the first sign of it. Generally, symptoms of this condition get worse over time. In rare cases,symptoms can get better. What are the causes? This condition may be caused by: Brain disorders like Alzheimer's disease, Parkinson's disease, and other conditions that gradually damage nerve cells (neurodegenerative conditions). Diseases that affect blood vessels in the brain and result in small strokes. Certain infections, such as HIV. Traumatic brain injury. Other medical conditions, such as brain tumors, underactive thyroid (hypothyroidism), and vitamin B12 deficiency. Use of certain drugs or prescription medicines. What increases the risk? The following factors may make you more likely to develop this condition: Being older than 65  years. Being male. Low education level. Diabetes, high blood pressure, high cholesterol, and other conditions that increase the risk for blood vessel diseases. Untreated or undertreated sleep apnea. Having a certain type of gene that can be passed from parent to child (inherited). Chronic health problems such as heart disease, lung disease, liver disease, kidney disease, or depression. What are the signs or symptoms? Symptoms of this condition include: Difficulty remembering. You may: Forget names, phone numbers, or details of recent events. Forget social events and appointments. Repeatedly forget where you put your car keys or other items. Difficulty thinking and solving problems. You may have trouble with complex tasks, such as: Paying bills. Driving in unfamiliar places. Difficulty communicating. You may have trouble: Finding the right word or naming an object. Forming a sentence that makes sense, or understanding what you read or hear. Changes in your behavior or personality. When this happens, you may: Lose interest in the things that you used to enjoy. Withdraw from social situations. Get angry more easily than usual. Act before thinking. How is this diagnosed? This condition is diagnosed based on: Your symptoms. Your health care provider may ask you and the people you spend time with, such as family and friends, about your symptoms. Evaluation of mental functions (neuropsychological testing). Your health care provider may refer you to a neurologist or mental health specialist to evaluate your mental functions in detail. To identify the cause of your condition, your health care provider may: Get a detailed medical history. Ask about use of alcohol, drugs, and prescription medicines. Do a physical exam. Order blood tests and brain imaging exams. How is this treated? Mild neurocognitive disorder that is caused by  medicine use, drug use, infection, or another medical condition  may improve when the cause is treated, or when medicines or drugs are stopped. If this disorder has another cause, it generally does not improve and may get worse. In these cases, the goal of treatment is to help you manage the loss of mental function. Treatments in these cases include: Medicine. Medicine mainly helps memory and behavior symptoms. Talk therapy. Talk therapy provides education, emotional support, memory aids, and other ways of making up for problems with mental function. Lifestyle changes, including: Getting regular exercise. Eating a healthy diet that includes omega-3 fatty acids. Challenging your thinking and memory skills. Having more social interaction. Follow these instructions at home: Eating and drinking  Drink enough fluid to keep your urine pale yellow. Eat a healthy diet that includes omega-3 fatty acids. These can be found in: Fish. Nuts. Leafy vegetables. Vegetable oils. If you drink alcohol: Limit how much you use to: 0-1 drink a day for women. 0-2 drinks a day for men. Be aware of how much alcohol is in your drink. In the [place] one drink equals one 12 oz bottle of beer (355 mL), one 5 oz glass of wine (148 mL), or one 1 oz glass of hard liquor (44 mL).  Lifestyle  Get regular exercise as told by your health care provider. Do not use any products that contain nicotine or tobacco, such as cigarettes, e-cigarettes, and chewing tobacco. If you need help quitting, ask your health care provider. Practice ways to manage stress. If you need help managing stress, ask your health care provider. Continue to have social interaction. Keep your mind active with stimulating activities you enjoy, such as reading or playing games. Make sure to get quality sleep. Follow these tips: Avoid napping during the day. Keep your sleeping area dark and cool. Avoid exercising during the few hours before you go to bed. Avoid caffeine products in the evening.  General  instructions Take over-the-counter and prescription medicines only as told by your health care provider. Your health care provider may recommend that you avoid taking medicines that can affect thinking, such as pain medicines or sleep medicines. Work with your health care provider to find out what you need help with and what your safety needs are. Keep all follow-up visits. This is important. Where to find more information [employer] on Aging: http://kim-miller.com/ Contact a health care provider if: You have any new symptoms. Get help right away if: You develop new confusion or your confusion gets worse. You act in ways that place you or your family in danger. Summary Mild neurocognitive disorder is a disorder in which memory does not work as well as it should. Mild neurocognitive disorder can have many causes. It may be the first stage of dementia. To manage your condition, get regular exercise, keep your mind active, get quality sleep, and eat a healthy diet. This information is not intended to replace advice given to you by your health care provider. Make sure you discuss any questions you have with your healthcare provider. Document Revised: 01/05/2020 Document Reviewed: 01/05/2020 Elsevier Patient Education  [place].

## 2021-04-26 NOTE — Progress Notes (Signed)
I agree with the above plan 

## 2021-05-03 DIAGNOSIS — F331 Major depressive disorder, recurrent, moderate: Secondary | ICD-10-CM | POA: Diagnosis not present

## 2021-05-03 DIAGNOSIS — F411 Generalized anxiety disorder: Secondary | ICD-10-CM | POA: Diagnosis not present

## 2021-05-04 DIAGNOSIS — I10 Essential (primary) hypertension: Secondary | ICD-10-CM | POA: Diagnosis not present

## 2021-05-04 DIAGNOSIS — I2510 Atherosclerotic heart disease of native coronary artery without angina pectoris: Secondary | ICD-10-CM | POA: Diagnosis not present

## 2021-05-04 DIAGNOSIS — F324 Major depressive disorder, single episode, in partial remission: Secondary | ICD-10-CM | POA: Diagnosis not present

## 2021-05-04 DIAGNOSIS — G4700 Insomnia, unspecified: Secondary | ICD-10-CM | POA: Diagnosis not present

## 2021-05-04 DIAGNOSIS — J449 Chronic obstructive pulmonary disease, unspecified: Secondary | ICD-10-CM | POA: Diagnosis not present

## 2021-05-04 DIAGNOSIS — E7800 Pure hypercholesterolemia, unspecified: Secondary | ICD-10-CM | POA: Diagnosis not present

## 2021-05-31 DIAGNOSIS — F411 Generalized anxiety disorder: Secondary | ICD-10-CM | POA: Diagnosis not present

## 2021-05-31 DIAGNOSIS — F331 Major depressive disorder, recurrent, moderate: Secondary | ICD-10-CM | POA: Diagnosis not present

## 2021-06-22 DIAGNOSIS — I10 Essential (primary) hypertension: Secondary | ICD-10-CM | POA: Diagnosis not present

## 2021-06-22 DIAGNOSIS — G4700 Insomnia, unspecified: Secondary | ICD-10-CM | POA: Diagnosis not present

## 2021-06-22 DIAGNOSIS — F324 Major depressive disorder, single episode, in partial remission: Secondary | ICD-10-CM | POA: Diagnosis not present

## 2021-06-22 DIAGNOSIS — J449 Chronic obstructive pulmonary disease, unspecified: Secondary | ICD-10-CM | POA: Diagnosis not present

## 2021-06-22 DIAGNOSIS — E7800 Pure hypercholesterolemia, unspecified: Secondary | ICD-10-CM | POA: Diagnosis not present

## 2021-06-22 DIAGNOSIS — I2510 Atherosclerotic heart disease of native coronary artery without angina pectoris: Secondary | ICD-10-CM | POA: Diagnosis not present

## 2021-07-01 DIAGNOSIS — F331 Major depressive disorder, recurrent, moderate: Secondary | ICD-10-CM | POA: Diagnosis not present

## 2021-07-24 ENCOUNTER — Emergency Department (HOSPITAL_COMMUNITY): Payer: Medicare Other

## 2021-07-24 ENCOUNTER — Inpatient Hospital Stay (HOSPITAL_COMMUNITY): Payer: Medicare Other

## 2021-07-24 ENCOUNTER — Inpatient Hospital Stay (HOSPITAL_COMMUNITY)
Admission: EM | Admit: 2021-07-24 | Discharge: 2021-07-30 | DRG: 917 | Disposition: A | Discharge status: Other Acute Inpatient Hospital | Payer: Medicare Other | Attending: Family Medicine | Admitting: Family Medicine

## 2021-07-24 ENCOUNTER — Encounter (HOSPITAL_COMMUNITY): Payer: Self-pay

## 2021-07-24 ENCOUNTER — Other Ambulatory Visit: Payer: Self-pay

## 2021-07-24 DIAGNOSIS — R0689 Other abnormalities of breathing: Secondary | ICD-10-CM | POA: Diagnosis not present

## 2021-07-24 DIAGNOSIS — I70203 Unspecified atherosclerosis of native arteries of extremities, bilateral legs: Secondary | ICD-10-CM | POA: Diagnosis present

## 2021-07-24 DIAGNOSIS — J9601 Acute respiratory failure with hypoxia: Secondary | ICD-10-CM

## 2021-07-24 DIAGNOSIS — Z8249 Family history of ischemic heart disease and other diseases of the circulatory system: Secondary | ICD-10-CM | POA: Diagnosis not present

## 2021-07-24 DIAGNOSIS — Z4682 Encounter for fitting and adjustment of non-vascular catheter: Secondary | ICD-10-CM | POA: Diagnosis not present

## 2021-07-24 DIAGNOSIS — I468 Cardiac arrest due to other underlying condition: Secondary | ICD-10-CM | POA: Diagnosis present

## 2021-07-24 DIAGNOSIS — T50904A Poisoning by unspecified drugs, medicaments and biological substances, undetermined, initial encounter: Secondary | ICD-10-CM | POA: Diagnosis not present

## 2021-07-24 DIAGNOSIS — E871 Hypo-osmolality and hyponatremia: Secondary | ICD-10-CM | POA: Diagnosis present

## 2021-07-24 DIAGNOSIS — Z20822 Contact with and (suspected) exposure to covid-19: Secondary | ICD-10-CM | POA: Diagnosis present

## 2021-07-24 DIAGNOSIS — R404 Transient alteration of awareness: Secondary | ICD-10-CM | POA: Diagnosis not present

## 2021-07-24 DIAGNOSIS — I10 Essential (primary) hypertension: Secondary | ICD-10-CM | POA: Diagnosis present

## 2021-07-24 DIAGNOSIS — R34 Anuria and oliguria: Secondary | ICD-10-CM | POA: Diagnosis present

## 2021-07-24 DIAGNOSIS — E785 Hyperlipidemia, unspecified: Secondary | ICD-10-CM | POA: Diagnosis present

## 2021-07-24 DIAGNOSIS — Z87891 Personal history of nicotine dependence: Secondary | ICD-10-CM

## 2021-07-24 DIAGNOSIS — T424X2A Poisoning by benzodiazepines, intentional self-harm, initial encounter: Principal | ICD-10-CM | POA: Diagnosis present

## 2021-07-24 DIAGNOSIS — T391X2A Poisoning by 4-Aminophenol derivatives, intentional self-harm, initial encounter: Secondary | ICD-10-CM | POA: Diagnosis present

## 2021-07-24 DIAGNOSIS — F332 Major depressive disorder, recurrent severe without psychotic features: Secondary | ICD-10-CM | POA: Diagnosis present

## 2021-07-24 DIAGNOSIS — T50902A Poisoning by unspecified drugs, medicaments and biological substances, intentional self-harm, initial encounter: Secondary | ICD-10-CM

## 2021-07-24 DIAGNOSIS — M1990 Unspecified osteoarthritis, unspecified site: Secondary | ICD-10-CM | POA: Diagnosis present

## 2021-07-24 DIAGNOSIS — R918 Other nonspecific abnormal finding of lung field: Secondary | ICD-10-CM | POA: Diagnosis not present

## 2021-07-24 DIAGNOSIS — G9340 Encephalopathy, unspecified: Secondary | ICD-10-CM | POA: Diagnosis not present

## 2021-07-24 DIAGNOSIS — T50901A Poisoning by unspecified drugs, medicaments and biological substances, accidental (unintentional), initial encounter: Secondary | ICD-10-CM | POA: Diagnosis not present

## 2021-07-24 DIAGNOSIS — G928 Other toxic encephalopathy: Secondary | ICD-10-CM | POA: Diagnosis present

## 2021-07-24 DIAGNOSIS — Z981 Arthrodesis status: Secondary | ICD-10-CM

## 2021-07-24 DIAGNOSIS — Z9861 Coronary angioplasty status: Secondary | ICD-10-CM | POA: Diagnosis not present

## 2021-07-24 DIAGNOSIS — E876 Hypokalemia: Secondary | ICD-10-CM | POA: Diagnosis present

## 2021-07-24 DIAGNOSIS — I2510 Atherosclerotic heart disease of native coronary artery without angina pectoris: Secondary | ICD-10-CM | POA: Diagnosis present

## 2021-07-24 DIAGNOSIS — R4182 Altered mental status, unspecified: Secondary | ICD-10-CM

## 2021-07-24 DIAGNOSIS — Z978 Presence of other specified devices: Secondary | ICD-10-CM

## 2021-07-24 DIAGNOSIS — J9621 Acute and chronic respiratory failure with hypoxia: Secondary | ICD-10-CM | POA: Diagnosis present

## 2021-07-24 DIAGNOSIS — R4189 Other symptoms and signs involving cognitive functions and awareness: Secondary | ICD-10-CM

## 2021-07-24 DIAGNOSIS — T1491XA Suicide attempt, initial encounter: Secondary | ICD-10-CM | POA: Diagnosis not present

## 2021-07-24 DIAGNOSIS — I69351 Hemiplegia and hemiparesis following cerebral infarction affecting right dominant side: Secondary | ICD-10-CM | POA: Diagnosis not present

## 2021-07-24 DIAGNOSIS — F419 Anxiety disorder, unspecified: Secondary | ICD-10-CM | POA: Diagnosis present

## 2021-07-24 DIAGNOSIS — G9341 Metabolic encephalopathy: Secondary | ICD-10-CM

## 2021-07-24 DIAGNOSIS — R1312 Dysphagia, oropharyngeal phase: Secondary | ICD-10-CM | POA: Diagnosis present

## 2021-07-24 DIAGNOSIS — Z955 Presence of coronary angioplasty implant and graft: Secondary | ICD-10-CM | POA: Diagnosis not present

## 2021-07-24 DIAGNOSIS — T887XXA Unspecified adverse effect of drug or medicament, initial encounter: Secondary | ICD-10-CM | POA: Diagnosis not present

## 2021-07-24 DIAGNOSIS — R464 Slowness and poor responsiveness: Secondary | ICD-10-CM | POA: Diagnosis not present

## 2021-07-24 DIAGNOSIS — J9690 Respiratory failure, unspecified, unspecified whether with hypoxia or hypercapnia: Secondary | ICD-10-CM | POA: Diagnosis not present

## 2021-07-24 DIAGNOSIS — I252 Old myocardial infarction: Secondary | ICD-10-CM

## 2021-07-24 DIAGNOSIS — Z95820 Peripheral vascular angioplasty status with implants and grafts: Secondary | ICD-10-CM

## 2021-07-24 DIAGNOSIS — I25119 Atherosclerotic heart disease of native coronary artery with unspecified angina pectoris: Secondary | ICD-10-CM

## 2021-07-24 DIAGNOSIS — R4020 Unspecified coma: Secondary | ICD-10-CM | POA: Diagnosis not present

## 2021-07-24 DIAGNOSIS — G4700 Insomnia, unspecified: Secondary | ICD-10-CM | POA: Diagnosis present

## 2021-07-24 LAB — RESP PANEL BY RT-PCR (FLU A&B, COVID) ARPGX2
Influenza A by PCR: NEGATIVE
Influenza B by PCR: NEGATIVE
SARS Coronavirus 2 by RT PCR: NEGATIVE

## 2021-07-24 LAB — ETHANOL: Alcohol, Ethyl (B): <10 mg/dL (ref <10)

## 2021-07-24 LAB — COMPREHENSIVE METABOLIC PANEL
ALT: 19 U/L (ref 0–44)
AST: 18 U/L (ref 15–41)
Albumin: 3.4 g/dL — ABNORMAL LOW (ref 3.5–5.0)
Alkaline Phosphatase: 60 U/L (ref 38–126)
Anion gap: 6 (ref 5–15)
BUN: 8 mg/dL (ref 8–23)
CO2: 25 mmol/L (ref 22–32)
Calcium: 7.9 mg/dL — ABNORMAL LOW (ref 8.9–10.3)
Chloride: 99 mmol/L (ref 98–111)
Creatinine, Ser: 0.75 mg/dL (ref 0.61–1.24)
GFR, Estimated: >60 mL/min (ref >60)
Glucose, Bld: 97 mg/dL (ref 70–99)
Potassium: 4.1 mmol/L (ref 3.5–5.1)
Sodium: 130 mmol/L — ABNORMAL LOW (ref 135–145)
Total Bilirubin: 0.7 mg/dL (ref 0.3–1.2)
Total Protein: 5.9 g/dL — ABNORMAL LOW (ref 6.5–8.1)

## 2021-07-24 LAB — I-STAT ARTERIAL BLOOD GAS, ED
Acid-Base Excess: 1 mmol/L (ref 0.0–2.0)
Bicarbonate: 25.7 mmol/L (ref 20.0–28.0)
Calcium, Ion: 1.12 mmol/L — ABNORMAL LOW (ref 1.15–1.40)
HCT: 38 % — ABNORMAL LOW (ref 39.0–52.0)
Hemoglobin: 12.9 g/dL — ABNORMAL LOW (ref 13.0–17.0)
O2 Saturation: 100 %
Patient temperature: 95.9
Potassium: 4.5 mmol/L (ref 3.5–5.1)
Sodium: 130 mmol/L — ABNORMAL LOW (ref 135–145)
TCO2: 27 mmol/L (ref 22–32)
pCO2 arterial: 36.7 mmHg (ref 32.0–48.0)
pH, Arterial: 7.448 (ref 7.350–7.450)
pO2, Arterial: 248 mmHg — ABNORMAL HIGH (ref 83.0–108.0)

## 2021-07-24 LAB — LIPASE, BLOOD: Lipase: 32 U/L (ref 11–51)

## 2021-07-24 LAB — GLUCOSE, CAPILLARY
Glucose-Capillary: 124 mg/dL — ABNORMAL HIGH (ref 70–99)
Glucose-Capillary: 123 mg/dL — ABNORMAL HIGH (ref 70–99)

## 2021-07-24 LAB — CBG MONITORING, ED: Glucose-Capillary: 97 mg/dL (ref 70–99)

## 2021-07-24 LAB — ACETAMINOPHEN LEVEL
Acetaminophen (Tylenol), Serum: 67 ug/mL — ABNORMAL HIGH (ref 10–30)
Acetaminophen (Tylenol), Serum: 36 ug/mL — ABNORMAL HIGH (ref 10–30)

## 2021-07-24 MED ORDER — DOCUSATE SODIUM 100 MG PO CAPS
100.0000 mg | ORAL_CAPSULE | Freq: Two times a day (BID) | ORAL | Status: DC | PRN
  Administered 2021-07-29: 100 mg via ORAL
  Filled 2021-07-24: qty 1

## 2021-07-24 MED ORDER — POLYETHYLENE GLYCOL 3350 17 G PO PACK: 17.0000 g | PACK | Freq: Every day | ORAL | Status: DC | PRN

## 2021-07-24 MED ORDER — SUCCINYLCHOLINE CHLORIDE 20 MG/ML IJ SOLN
INTRAMUSCULAR | Status: AC | PRN
  Administered 2021-07-24: 140 mg via INTRAVENOUS

## 2021-07-24 MED ORDER — PROPOFOL 1000 MG/100ML IV EMUL
0.0000 ug/kg/min | INTRAVENOUS | Status: DC
  Administered 2021-07-24: 10 ug/kg/min via INTRAVENOUS
  Filled 2021-07-24: qty 100

## 2021-07-24 MED ORDER — LABETALOL HCL 5 MG/ML IV SOLN
10.0000 mg | INTRAVENOUS | Status: DC | PRN
  Administered 2021-07-24 – 2021-07-25 (×2): 10 mg via INTRAVENOUS
  Filled 2021-07-24 (×3): qty 4

## 2021-07-24 MED ORDER — FENTANYL CITRATE PF 50 MCG/ML IJ SOSY
50.0000 ug | PREFILLED_SYRINGE | Freq: Once | INTRAMUSCULAR | Status: AC
  Administered 2021-07-24: 50 ug via INTRAVENOUS
  Filled 2021-07-24: qty 1

## 2021-07-24 MED ORDER — FENTANYL 2500MCG IN NS 250ML (10MCG/ML) PREMIX INFUSION
25.0000 ug/h | INTRAVENOUS | Status: DC
  Administered 2021-07-24: 50 ug/h via INTRAVENOUS
  Filled 2021-07-24: qty 250

## 2021-07-24 MED ORDER — CHLORHEXIDINE GLUCONATE 0.12% ORAL RINSE (MEDLINE KIT)
15.0000 mL | Freq: Two times a day (BID) | OROMUCOSAL | Status: DC
  Administered 2021-07-24 – 2021-07-25 (×3): 15 mL via OROMUCOSAL

## 2021-07-24 MED ORDER — LACTATED RINGERS IV SOLN: INTRAVENOUS | Status: DC

## 2021-07-24 MED ORDER — HEPARIN SODIUM (PORCINE) 5000 UNIT/ML IJ SOLN
5000.0000 [IU] | Freq: Three times a day (TID) | INTRAMUSCULAR | Status: DC
  Administered 2021-07-24 – 2021-07-30 (×16): 5000 [IU] via SUBCUTANEOUS
  Filled 2021-07-24 (×15): qty 1

## 2021-07-24 MED ORDER — SODIUM CHLORIDE 0.9 % IV SOLN: INTRAVENOUS | Status: DC

## 2021-07-24 MED ORDER — ORAL CARE MOUTH RINSE
15.0000 mL | OROMUCOSAL | Status: DC
  Administered 2021-07-24 – 2021-07-25 (×8): 15 mL via OROMUCOSAL

## 2021-07-24 MED ORDER — PANTOPRAZOLE SODIUM 40 MG IV SOLR
40.0000 mg | Freq: Every day | INTRAVENOUS | Status: DC
  Administered 2021-07-24 – 2021-07-25 (×2): 40 mg via INTRAVENOUS
  Filled 2021-07-24 (×2): qty 40

## 2021-07-24 MED ORDER — FENTANYL BOLUS VIA INFUSION
25.0000 ug | INTRAVENOUS | Status: DC | PRN
  Filled 2021-07-24: qty 25

## 2021-07-24 MED ORDER — ETOMIDATE 2 MG/ML IV SOLN
INTRAVENOUS | Status: AC | PRN
  Administered 2021-07-24: 10 mg via INTRAVENOUS

## 2021-07-24 MED ORDER — ONDANSETRON HCL 4 MG/2ML IJ SOLN: 4.0000 mg | Freq: Four times a day (QID) | INTRAMUSCULAR | Status: DC | PRN

## 2021-07-24 MED ORDER — NALOXONE HCL 4 MG/10ML IJ SOLN
1.0000 mg/h | INTRAVENOUS | Status: DC
  Administered 2021-07-24 – 2021-07-25 (×5): 1 mg/h via INTRAVENOUS
  Filled 2021-07-24 (×6): qty 10

## 2021-07-24 NOTE — ED Notes (Signed)
[facility] staff unavailable to receive report for pt at this time.

## 2021-07-24 NOTE — ED Provider Notes (Signed)
Procedure Name: Intubation Date/Time: 07/24/2021 1:22 PM Performed by: Carlisle Cater, PA-C Pre-anesthesia Checklist: Patient identified, Patient being monitored and Timeout performed Oxygen Delivery Method: Non-rebreather mask Preoxygenation: Pre-oxygenation with 100% oxygen Induction Type: Rapid sequence Ventilation: Nasal airway inserted- appropriate to patient size Laryngoscope Size: Glidescope Grade View: Grade III Tube size: 7.5 mm Number of attempts: 1 Airway Equipment and Method: Video-laryngoscopy Placement Confirmation: ETT inserted through vocal cords under direct vision, CO2 detector and Breath sounds checked- equal and bilateral Secured at: 23 cm Tube secured with: ETT holder Dental Injury: Teeth and Oropharynx as per pre-operative assessment  Difficulty Due To: Difficulty was unanticipated       Carlisle Cater, PA-C 07/24/21 1324    Fredia Sorrow, MD 07/24/21 1537

## 2021-07-24 NOTE — ED Triage Notes (Signed)
Pt BIB [facility] from home d/t possible OD from xanax after taking them at approx. 1030. Son reports pt told him "I just want to go to sleep and not wake up." Pt has Hx of SI with xanax (per son). Upon arrival pt is unresponsive, nasal trumpet in Left Nare, NRB mask on, snoring respirations occasionally. PIV in bilateral AC's. Pupils pinpoint, EMS reports no gag reflex.

## 2021-07-24 NOTE — ED Notes (Signed)
Bare hugger applied for low temp.

## 2021-07-24 NOTE — ED Notes (Signed)
Pt moved to TRA A, all staff at bedside & ready to intubate.

## 2021-07-24 NOTE — ED Provider Notes (Addendum)
[facility] EMERGENCY DEPARTMENT Provider Note   CSN: [id number] Arrival date & time: 07/24/21  1233     History Chief Complaint  Patient presents with   Drug Overdose    Terry Kemp is a 75 y.o. male.  Patient had contacted his son this morning.  Sometime prior to 1030 stating that he did not want a live anymore.  Son contacted [place] they estimate that around 1030 he ingested several Ativan pills.  Possibility of other unknown substances ingested.  Patient was found unresponsive.  Patient brought in by EMS on high percent nonrebreather nasal trumpet in place.  Gurgling and snoring.  Does not seem to be protecting his airway well.  Patient completely unresponsive here.  Blood sugar is normal.  On percent nonrebreather oxygen sats are okay.  Patient was hypothermic.  Started on [employer].  EMS gave Narcan.  With minimal improvement.  Did not hold her last.  Chart review shows that patient was followed by the [place] system.  And was receiving some behavioral health counseling.  Also seen by [facility] neurology in August of this year for memory issues.  And patient felt he was getting the point where he could not take care of himself anymore.  And was very upset about prospect of probably having to move back in with his ex-wife.  Patient's had evidence of small lacunar infarcts in the past and may be TIAs.  Also got additional information that fire upon arrival originally did some CPR.      Past Medical History:  Diagnosis Date   Anxiety    Arthritis    CAD S/P percutaneous coronary angioplasty 01/2005   ST elevation MI -CATH/PCI-RCA (Cypher DES 3 x 28 - 3.81mm); CATH 2008 FOR INFERIOR ISCHEMIA - 80% dRCA ISR reduced to 40% with NTG - spasm.   Depression    Hyperlipidemia    Hypertension    Myocardial infarction [facility])    2 strokes   PAD (peripheral artery disease) ([place]) 2000   bilateral iliac arteries PTA  and  stenting in 04/30/1998; In  2006-subsequent  R Common Iliac (RCIA) 100% ISR, 75 % LCIA -- staged Bifuracation Kissing Stent creating new Aortic Carina.-- Overlapping 7 mm x 21mm & 32mm stents RCIA & kissing 7 mm x 29 mm in LCIA.   Stroke [facility]) 2013; 08/2015   a) Felt to be a TIA;; b) Imaging confirmed CVA - mild residual R-sided weakness    Patient Active Problem List   Diagnosis Date Noted   Acute on chronic respiratory failure with hypoxia ([place]) 06/27/2019   Pneumonia due to COVID-19 virus 06/27/2019   Hyponatremia 06/27/2019   Hypokalemia 06/27/2019   AMS (altered mental status) 06/27/2019   Intentional benzodiazepine overdose ([place])    Suicide attempt [facility])    History of stroke 04/03/2017   Stroke ([place]) 09/15/2015   Tobacco use disorder 09/15/2015   Essential hypertension 12/23/2014   Current every day smoker - 38 Pk year. 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 mRCA 100% (infaract-related vessel) --> DES PCI; AbdAo-Iliac Angio: PAD R Common Iliac (RCIA) 100% ISR, LCIA 75% ISR @ outflow tract of the left iliac. LVEF~ 60%   CARDIAC CATHETERIZATION  07/17/2007   40% dLM (previously read as 50%); 80% mRCA - distal stent ISR --> reduced to 40% with NTG = due to spasm--widely patent bilaeral common illiacs   CORONARY ANGIOPLASTY WITH STENT PLACEMENT  01/10/2005   mRCA DES PCI - CYPHER DES 3.0 x 28 mm -> post-dilated to 3.25 mm    EYE SURGERY Bilateral 2000   cataracts   lower extremities arterial doppler Bilateral 01/26/2005,09/28/2004   01/26/05-- right ABI -MODERATE,LEFT ABI MILLD -RIGHT CIA STENT OCCLUDED ,RIGHT IIA REVERSIBLE FLOW;LEFT CIA >70%--09/28/2004- RGT ABI'S 76, LFT ABI'S102   NM MYOCAR PERF EJECTION FRACTION  06/03/2007   REST/STRESS -- EF 69% MILD INFEROAPICAL ISCHEMIA NOTED - Referred for CATH - RCA PCI   NM MYOVIEW LTD  11/2014   Low Risk - "Diaphragmatic attenuation", no infarct or ischemia   peripheral abd aortic cath  04/04/2005   performed by Dr Rollene Fare---  LCIA 75% ISR, 100% RCIA ISR: kissing balloon PTA  --> Kissing STENTs Bilateral CIA (RCIA 2 overlapping 7.0 x 39 mm & 7.0 x 18 mm Gensis stents, LCIA - 23mm x 29 mm Gensis stent (noted to be patent in 2008 Cath)   peripheral arteries disease  09/1999   status post bilatera iliac stents jan 2001. three stents in the left common iliasc ,1 widely patent stent right common iliac, 2 overlapping stents revealed nio more 10% in-stent restenosis in 2008.    SPINAL FUSION  11/2014   SURGERY BY DR Ellene Route   TRANSTHORACIC ECHOCARDIOGRAM  09/2015   Normal LV size and systolic function, EF 40-98%. Normal RV size.  No valvular lesions.       Family History  Problem Relation Age of Onset   Cancer Mother    Heart disease Maternal Grandfather     Social History   Tobacco Use   Smoking status: Former    Packs/day: 1.00    Years: 38.00    Pack years: 38.00    Types: Cigarettes    Quit date: 01/01/2017    Years since quitting: 4.5   Smokeless tobacco: Never  Vaping Use   Vaping Use: Never used  Substance Use Topics   Alcohol use: Yes    Alcohol/week: 14.0 standard drinks    Types: 14 Glasses of wine per week   Drug use: No    Home Medications Prior to Admission medications   Medication Sig Start Date End Date Taking? Authorizing Provider  ALPRAZolam Duanne Moron) 1 MG tablet Take 0.5 mg by mouth as needed for anxiety or sleep.  Patient not  taking: Reported on 04/25/2021 02/16/14   [provider]  amLODipine (NORVASC) 10 MG tablet TAKE 1 TABLET BY MOUTH ONCE DAILY Patient taking differently: Take 10 mg by mouth daily. 09/25/18   Leonie Man, MD  aspirin EC 81 MG tablet Take 1 tablet (81 mg total) by mouth daily. Swallow whole. 10/26/20   Garvin Fila, MD  atorvastatin (LIPITOR) 40 MG tablet Take 1 tablet by mouth once daily 09/06/20   Leonie Man, MD  Cyanocobalamin (VITAMIN B-12 CR PO) Take by mouth daily. Reported on 08/26/2015    [provider]  metoprolol succinate (TOPROL XL) 25 MG  24 hr tablet Take 1 tablet (25 mg total) by mouth daily. 06/22/17   Leonie Man, MD  Multiple Vitamin (MULTIVITAMIN) tablet Take 1 tablet by mouth daily.    [provider]  nicotine polacrilex (NICOTINE MINI) 2 MG lozenge Take 2 mg by mouth as needed for smoking cessation. Reports takeing12 per day    [provider]  ramipril (ALTACE) 10 MG capsule TAKE 1 CAPSULE BY MOUTH DAILY 05/14/19   Leonie Man, MD    Allergies    Zoloft [sertraline hcl]  Review of Systems   Review of Systems  Unable to perform ROS: Patient unresponsive   Physical Exam Updated Vital Signs BP (!) 162/84   Pulse (!) 46   Resp (!) 8   Ht 1.676 m (5\' 6" )   SpO2 100%   BMI 23.40 kg/m   Physical Exam Constitutional:      General: He is in acute distress.  Eyes:     Comments: Pupils pinpoint nonreactive to  Cardiovascular:     Rate and Rhythm: Normal rate.  Pulmonary:     Effort: Respiratory distress present.     Breath sounds: Rhonchi present.  Abdominal:     General: There is no distension.  Musculoskeletal:        General: No swelling.     Cervical back: Neck supple.  Skin:    Capillary Refill: Capillary refill takes 2 to 3 seconds.     Comments: Can color is pink.  But is cool.  Neurological:     Comments: Completely unresponsive.  No movement of extremities.    ED Results / Procedures /  Treatments   Labs (all labs ordered are listed, but only abnormal results are displayed) Labs Reviewed  RESP PANEL BY RT-PCR (FLU A&B, COVID) ARPGX2  RAPID URINE DRUG SCREEN, HOSP PERFORMED  ETHANOL  COMPREHENSIVE METABOLIC PANEL  LIPASE, BLOOD  ACETAMINOPHEN LEVEL  CBG MONITORING, ED    EKG None  Radiology No results found.  Procedures Procedures   Medications Ordered in ED Medications  0.9 %  sodium chloride infusion (has no administration in time range)    ED Course  I have reviewed the triage vital signs and the nursing notes.  Pertinent labs & imaging results that were available during my care of the patient were reviewed by me and considered in my medical decision making (see chart for details).    MDM Rules/Calculators/A&P                         CRITICAL CARE Performed by: Fredia Sorrow Total critical care time: 60 minutes Critical care time was exclusive of separately billable procedures and treating other patients. Critical care was necessary to treat or prevent imminent or life-threatening deterioration. Critical care was time spent personally by me on the following activities: development of treatment plan with patient and/or surrogate as well as nursing, discussions with consultants, evaluation of patient's response to treatment, examination of patient, obtaining history from patient or surrogate, ordering and performing treatments and interventions, ordering and review of laboratory studies, ordering and review of radiographic studies, pulse oximetry and re-evaluation of patient's condition.  Patient appears to have done a significant overdose.  Completely unresponsive.  Not protecting his airway very well.  Even with nasal trumpet in place does not have good gag.  Patient is snoring and gurgling at times.  Discussed with respiratory therapy will go on and intubate him.  Patient's blood pressure is fine  here currently.  Patient will be moved to one of the  resuscitation rooms.  Overdose medications ordered.  Patient started on the bear hugger since his temperature was low.  Initial blood sugar was fine.  Best we can estimate may be 30 tablets of Ativan.  But not clear if there was any additional ingestion.  Will probably require admission by critical care service.  Following intubation.  Intubation done by physician assistant with me helping him.  Intubation went well with a 7 and half tube patient received etomidate and succinylcholine.  No trouble.  Intubated on first pass good breath sounds bilaterally and good color change on CO2 monitoring.  Will contact critical care for admission.  We will get CT head.  We will repeat chest x-ray following intubation.  Final Clinical Impression(s) / ED Diagnoses Final diagnoses:  Intentional overdose, initial encounter ([place])  Altered mental status, unspecified altered mental status type  Unresponsive    Rx / DC Orders ED Discharge Orders     None        Fredia Sorrow, MD 07/24/21 1321    Fredia Sorrow, MD 07/24/21 1341

## 2021-07-24 NOTE — Progress Notes (Signed)
Bag of fentanyl gtt sent up to unit from ED unhooked to patient. Crystal Rice, RN and This RN wasted 220 ml in the waste container.

## 2021-07-24 NOTE — H&P (Addendum)
NAME:  Terry Kemp, MRN:  [id number], DOB:  February 24, 1946, LOS: 0 ADMISSION DATE:  07/24/2021, CONSULTATION DATE:  07/24/21 REFERRING MD:  Rogene Houston - EM, CHIEF COMPLAINT:  Suspected intentional overdose    History of Present Illness:  75 yo M PMH MDD with SI (reportedly w previous intentional BZD OD) Anxiety, CAD, PAD, HTN who presents to ED 11/20 via EMS for intentional overdose. Per pt son the pt communicated to him at 1038 that desire not to live and that he had just taken several xanax. Son arrived to father at 75, where he was very drowsy but able to be aroused when he shook him at first, later being less responsive. EMS was dispatched, arriving at 1110 and found patient unresponsive and possibly pulseless (there are varying reports about if patient received brief CPR). Nasal trumped placed by EMS and given 1x narcan without improvement. BVM to ED.   In ED, unresponsive with snoring respirations and gurgling. Decision made to intubate. Following intubation has been started on fent and prop infusions.   PCCM is consulted for admission in this setting  At time of consultation, Limited labs and imaging have been completed for review.  Labs at admission: Na 130  APAP 67  , Ethyl alcohol < 10   Pertinent  Medical History  MDD with SI Anxiety HTN CAD MI  Significant Hospital Events: Including procedures, antibiotic start and stop dates in addition to other pertinent events   11/20 intentional overdose, possible brief OOH arrest. Intubated  Interim History / Subjective:  I turned off propofol and fentanyl infusions   Objective   Blood pressure 108/75, pulse (!) 55, temperature (!) 95.9 F (35.5 C), temperature source Rectal, resp. rate 14, height 5\' 6"  (1.676 m), SpO2 100 %.    Vent Mode: PRVC FiO2 (%):  [60 %] 60 % Set Rate:  [15 bmp] 15 bmp Vt Set:  [510 mL] 510 mL PEEP:  [5 cmH20] 5 cmH20 Plateau Pressure:  [11 cmH20] 11 cmH20  No intake or output data in the 24  hours ending 07/24/21 1351 There were no vitals filed for this visit.  Examination: General: Critically ill WDWN older adult M intubated sedated  HENT: NCAT pink mm ETT secure Lungs: Scattered rhonchi. Mechanically ventilated. Symmetrical chest expansion  Cardiovascular: bradycardic s1s2 cap refill brisk  Abdomen: soft ndnt + bowel sounds  Extremities: no acute joint deformity no cyanosis or clubbing Neuro: Sedation just shut off. Pinpoint pupils. Does not respond to pain, no cough/gag  GU: defer   Resolved Hospital Problem list     Assessment & Plan:   Acute toxic metabolic encephalopathy Intentional Overdose MDD with SI  -intentional OD. Known Xanax, unknown if other substances. It sounds like he took these about 2 hours prior to arrival in ED. Sounds like he was had shallow gurgling respirations out of hospital & there is mixed report of possible OOH pea arrest. With this, could have possible hypoxic encephalopathy as well   P -dc prop gtt dc fent gtt -start narcan gtt  -CT H  -may need eeg, pending course  -recheck APAP level, LFTS  -UDS (though this may be confounded by the sedation given in the ED)   Acute respiratory failure with hypoxia Suspected aspiration event  P -Changed to PRVC, get an ABG -full MV support  -Vap, pulm hygiene -AM CXR  -empiric abx for aspiration   Possible brief OOH arrest Bradycardia  -HR possibly due to hypothermia vs medication related.  P -supportive  care  -ICU monitoring  Hyponatremia  P -trend BMP   Best Practice (right click and "Reselect all SmartList Selections" daily)   Diet/type: tubefeeds DVT prophylaxis: prophylactic heparin  GI prophylaxis: PPI Lines: N/A Foley:  Yes, and it is still needed Code Status:  full code -- I do see in 2020 Pt was DNR, at present will maintain full code by default until this can be addressed with family  Last date of multidisciplinary goals of care discussion [11/20 son Corene Cornea updated -- he  is NOK. Pt Ex wife (they are still very close and live with each other) updated at bedside. Son Corene Cornea does share that he would like her to remain updated as well & while he is NOK they will be making shared decisions   Labs   CBC: No results for input(s): WBC, NEUTROABS, HGB, HCT, MCV, PLT in the last 168 hours.  Basic Metabolic Panel: Recent Labs  Lab 07/24/21 1244  NA 130*  K 4.1  CL 99  CO2 25  GLUCOSE 97  BUN 8  CREATININE 0.75  CALCIUM 7.9*   GFR: CrCl cannot be calculated (Unknown ideal weight.). No results for input(s): PROCALCITON, WBC, LATICACIDVEN in the last 168 hours.  Liver Function Tests: Recent Labs  Lab 07/24/21 1244  AST 18  ALT 19  ALKPHOS 60  BILITOT 0.7  PROT 5.9*  ALBUMIN 3.4*   Recent Labs  Lab 07/24/21 1244  LIPASE 32   No results for input(s): AMMONIA in the last 168 hours.  ABG    Component Value Date/Time   TCO2 24 08/16/2015 1749     Coagulation Profile: No results for input(s): INR, PROTIME in the last 168 hours.  Cardiac Enzymes: No results for input(s): CKTOTAL, CKMB, CKMBINDEX, TROPONINI in the last 168 hours.  HbA1C: No results found for: HGBA1C  CBG: Recent Labs  Lab 07/24/21 1242  GLUCAP 97    Review of Systems:   Unable to obtain, intubated and encephalopathic   Past Medical History:  He,  has a past medical history of Anxiety, Arthritis, CAD S/P percutaneous coronary angioplasty (01/2005), Depression, Hyperlipidemia, Hypertension, Myocardial infarction [facility]), PAD (peripheral artery disease) ([place]) (2000), and Stroke ([place]) (2013; 08/2015).   Surgical History:   Past Surgical History:  Procedure Laterality Date   ABDOMINAL AOTROGRAM  07/17/2007   ADDTION WITH CARDIAC CATH---(INFRARENAL WITH BIFEMORAL RUNOFF )----WIDELY PATENT STENTS TO LEFT AND RIGHT COMMON ILIACS   BACK SURGERY     metal plate   CARDIAC CATHETERIZATION  01/10/2005   Inf STEMI: dLM 50% - calcified. mRCA 100% (infaract-related vessel) -->  DES PCI; AbdAo-Iliac Angio: PAD R Common Iliac (RCIA) 100% ISR, LCIA 75% ISR @ outflow tract of the left iliac. LVEF~ 60%   CARDIAC CATHETERIZATION  07/17/2007   40% dLM (previously read as 50%); 80% mRCA - distal stent ISR --> reduced to 40% with NTG = due to spasm--widely patent bilaeral common illiacs   CORONARY ANGIOPLASTY WITH STENT PLACEMENT  01/10/2005   mRCA DES PCI - CYPHER DES 3.0 x 28 mm -> post-dilated to 3.25 mm   EYE SURGERY Bilateral 2000   cataracts   lower extremities arterial doppler Bilateral 01/26/2005,09/28/2004   01/26/05-- right ABI -MODERATE,LEFT ABI MILLD -RIGHT CIA STENT OCCLUDED ,RIGHT IIA REVERSIBLE FLOW;LEFT CIA >70%--09/28/2004- RGT ABI'S 76, LFT ABI'S102   NM MYOCAR PERF EJECTION FRACTION  06/03/2007   REST/STRESS -- EF 69% MILD INFEROAPICAL ISCHEMIA NOTED - Referred for CATH - RCA PCI   NM MYOVIEW LTD  11/2014   Low Risk - "Diaphragmatic attenuation", no infarct or ischemia   peripheral abd aortic cath  04/04/2005   performed by Dr Rollene Fare---  LCIA 75% ISR, 100% RCIA ISR: kissing balloon PTA  --> Kissing STENTs Bilateral CIA (RCIA 2 overlapping 7.0 x 39 mm & 7.0 x 18 mm Gensis stents, LCIA - 44mm x 29 mm Gensis stent (noted to be patent in 2008 Cath)   peripheral arteries disease  09/1999   status post bilatera iliac stents jan 2001. three stents in the left common iliasc ,1 widely patent stent right common iliac, 2 overlapping stents revealed nio more 10% in-stent restenosis in 2008.    SPINAL FUSION  11/2014   SURGERY BY DR Ellene Route   TRANSTHORACIC ECHOCARDIOGRAM  09/2015   Normal LV size and systolic function, EF 93-79%. Normal RV size.  No valvular lesions.     Social History:   reports that he quit smoking about 4 years ago. His smoking use included cigarettes. He has a 38.00 pack-year smoking history. He has never used smokeless tobacco. He reports current alcohol use of about 14.0 standard drinks per week. He reports that he does not use drugs.   Family  History:  His family history includes Cancer in his mother; Heart disease in his maternal grandfather.   Allergies Allergies  Allergen Reactions   Zoloft [Sertraline Hcl] Other (See Comments)    Psychosis per patient.     Home Medications  Prior to Admission medications   Medication Sig Start Date End Date Taking? Authorizing Provider  ALPRAZolam Duanne Moron) 1 MG tablet Take 0.5 mg by mouth as needed for anxiety or sleep.  Patient not taking: Reported on 04/25/2021 02/16/14   [provider]  amLODipine (NORVASC) 10 MG tablet TAKE 1 TABLET BY MOUTH ONCE DAILY Patient taking differently: Take 10 mg by mouth daily. 09/25/18   Leonie Man, MD  aspirin EC 81 MG tablet Take 1 tablet (81 mg total) by mouth daily. Swallow whole. 10/26/20   Garvin Fila, MD  atorvastatin (LIPITOR) 40 MG tablet Take 1 tablet by mouth once daily 09/06/20   Leonie Man, MD  Cyanocobalamin (VITAMIN B-12 CR PO) Take by mouth daily. Reported on 08/26/2015    [provider]  metoprolol succinate (TOPROL XL) 25 MG 24 hr tablet Take 1 tablet (25 mg total) by mouth daily. 06/22/17   Leonie Man, MD  Multiple Vitamin (MULTIVITAMIN) tablet Take 1 tablet by mouth daily.    [provider]  nicotine polacrilex (NICOTINE MINI) 2 MG lozenge Take 2 mg by mouth as needed for smoking cessation. Reports takeing12 per day    [provider]  ramipril (ALTACE) 10 MG capsule TAKE 1 CAPSULE BY MOUTH DAILY 05/14/19   Leonie Man, MD     Critical care time: 68 minutes       CRITICAL CARE Performed by: Cristal Generous   Total critical care time: 68 minutes  Critical care time was exclusive of separately billable procedures and treating other patients. Critical care was necessary to treat or prevent imminent or life-threatening deterioration.  Critical care was time spent personally by me on the following activities: development of treatment plan with patient and/or surrogate as  well as nursing, discussions with consultants, evaluation of patient's response to treatment, examination of patient, obtaining history from patient or surrogate, ordering and performing treatments and interventions, ordering and review of laboratory studies, ordering and review of radiographic studies, pulse oximetry and re-evaluation  of patient's condition.  Eliseo Gum MSN, AGACNP-BC [place] for pager  07/24/2021, 3:48 PM

## 2021-07-24 NOTE — Progress Notes (Signed)
[place] Progress Note Patient Name: Terry Kemp DOB: Dec 11, 1945 MRN: [id number]   Date of Service  07/24/2021  HPI/Events of Note  Oliguria - Bladder scan with 348 mL residual.   eICU Interventions  Plan: I/O cath PRN.     Intervention Category Major Interventions: Other:  Lysle Dingwall 07/24/2021, 9:58 PM

## 2021-07-24 NOTE — ED Notes (Signed)
Critical Care providers at bedside.

## 2021-07-24 NOTE — Progress Notes (Signed)
[place] Progress Note Patient Name: RONDEY FALLEN DOB: [birth year] MRN: [id number]   Date of Service  07/24/2021  HPI/Events of Note  Acetaminophen level = 37 at 6 PM which is approximately 8 hours post ingestion and is below the treatment line on the acetaminophen poisoning nomogram.   eICU Interventions  Continue present management.      Intervention Category Major Interventions: Other:  Lysle Dingwall 07/24/2021, 10:08 PM

## 2021-07-24 NOTE — ED Notes (Signed)
Pt is still unresponsive since all sedatives were D/C @ 1447.

## 2021-07-24 NOTE — ED Notes (Signed)
Terry Kemp (wife or ex-wife?) requests a call to tell her when she is allowed to visit patient.

## 2021-07-25 ENCOUNTER — Inpatient Hospital Stay (HOSPITAL_COMMUNITY): Payer: Medicare Other

## 2021-07-25 DIAGNOSIS — J9601 Acute respiratory failure with hypoxia: Secondary | ICD-10-CM

## 2021-07-25 LAB — GLUCOSE, CAPILLARY
Glucose-Capillary: 115 mg/dL — ABNORMAL HIGH (ref 70–99)
Glucose-Capillary: 135 mg/dL — ABNORMAL HIGH (ref 70–99)
Glucose-Capillary: 136 mg/dL — ABNORMAL HIGH (ref 70–99)
Glucose-Capillary: 139 mg/dL — ABNORMAL HIGH (ref 70–99)
Glucose-Capillary: 143 mg/dL — ABNORMAL HIGH (ref 70–99)
Glucose-Capillary: 146 mg/dL — ABNORMAL HIGH (ref 70–99)

## 2021-07-25 LAB — BASIC METABOLIC PANEL
Anion gap: 8 (ref 5–15)
BUN: 10 mg/dL (ref 8–23)
CO2: 20 mmol/L — ABNORMAL LOW (ref 22–32)
Calcium: 8.2 mg/dL — ABNORMAL LOW (ref 8.9–10.3)
Chloride: 98 mmol/L (ref 98–111)
Creatinine, Ser: 0.79 mg/dL (ref 0.61–1.24)
GFR, Estimated: >60 mL/min (ref >60)
Glucose, Bld: 137 mg/dL — ABNORMAL HIGH (ref 70–99)
Potassium: 3.5 mmol/L (ref 3.5–5.1)
Sodium: 126 mmol/L — ABNORMAL LOW (ref 135–145)

## 2021-07-25 LAB — CBC
HCT: 39.7 % (ref 39.0–52.0)
Hemoglobin: 14.2 g/dL (ref 13.0–17.0)
MCH: 30.6 pg (ref 26.0–34.0)
MCHC: 35.8 g/dL (ref 30.0–36.0)
MCV: 85.6 fL (ref 80.0–100.0)
Platelets: 195 10*3/uL (ref 150–400)
RBC: 4.64 MIL/uL (ref 4.22–5.81)
RDW: 11.8 % (ref 11.5–15.5)
WBC: 8.4 10*3/uL (ref 4.0–10.5)
nRBC: 0 % (ref 0.0–0.2)

## 2021-07-25 LAB — TRIGLYCERIDES: Triglycerides: 154 mg/dL — ABNORMAL HIGH (ref <150)

## 2021-07-25 LAB — MAGNESIUM: Magnesium: 1.7 mg/dL (ref 1.7–2.4)

## 2021-07-25 MED ORDER — MAGNESIUM SULFATE 2 GM/50ML IV SOLN
2.0000 g | Freq: Once | INTRAVENOUS | Status: AC
  Administered 2021-07-25: 2 g via INTRAVENOUS
  Filled 2021-07-25: qty 50

## 2021-07-25 MED ORDER — CHLORHEXIDINE GLUCONATE CLOTH 2 % EX PADS
6.0000 | MEDICATED_PAD | Freq: Every day | CUTANEOUS | Status: DC
  Administered 2021-07-26 – 2021-07-28 (×3): 6 via TOPICAL

## 2021-07-25 MED ORDER — ORAL CARE MOUTH RINSE
15.0000 mL | Freq: Two times a day (BID) | OROMUCOSAL | Status: DC
  Administered 2021-07-26: 15 mL via OROMUCOSAL

## 2021-07-25 MED ORDER — CHLORHEXIDINE GLUCONATE CLOTH 2 % EX PADS: 6.0000 | MEDICATED_PAD | Freq: Every day | CUTANEOUS | Status: DC

## 2021-07-25 MED ORDER — POTASSIUM CHLORIDE 20 MEQ PO PACK
40.0000 meq | PACK | Freq: Once | ORAL | Status: AC
  Administered 2021-07-25: 40 meq
  Filled 2021-07-25: qty 2

## 2021-07-25 MED ORDER — HYDRALAZINE HCL 20 MG/ML IJ SOLN
10.0000 mg | INTRAMUSCULAR | Status: DC | PRN
Start: 2021-07-25 — End: 2021-07-30
  Administered 2021-07-25: 20 mg via INTRAVENOUS
  Administered 2021-07-25: 10 mg via INTRAVENOUS
  Administered 2021-07-25 – 2021-07-30 (×3): 20 mg via INTRAVENOUS
  Filled 2021-07-25 (×5): qty 1

## 2021-07-25 NOTE — Progress Notes (Addendum)
NAME:  Terry Kemp, MRN:  [id number], DOB:  Aug 06, 1946, LOS: 1 ADMISSION DATE:  07/24/2021, CONSULTATION DATE:  07/24/21 REFERRING MD:  Rogene Houston - EM, CHIEF COMPLAINT:  Suspected intentional overdose    History of Present Illness:  75 yo M PMH MDD with SI (reportedly w previous intentional BZD OD) Anxiety, CAD, PAD, HTN who presents to ED 11/20 via EMS for intentional overdose. Per pt son the pt communicated to him at 1038 that desire not to live and that he had just taken several xanax. Son arrived to father at 52, where he was very drowsy but able to be aroused when he shook him at first, later being less responsive. EMS was dispatched, arriving at 1110 and found patient unresponsive and possibly pulseless (there are varying reports about if patient received brief CPR). Nasal trumped placed by EMS and given 1x narcan without improvement. BVM to ED.   In ED, unresponsive with snoring respirations and gurgling. Decision made to intubate. Following intubation has been started on fent and prop infusions.   PCCM is consulted for admission in this setting  At time of consultation, Limited labs and imaging have been completed for review.  Labs at admission: Na 130  APAP 67  , Ethyl alcohol < 10   Pertinent  Medical History  MDD with SI Anxiety HTN CAD MI  Significant Hospital Events: Including procedures, antibiotic start and stop dates in addition to other pertinent events   11/20 intentional overdose, possible brief OOH arrest. Intubated  Interim History / Subjective:  I turned off propofol and fentanyl infusions   Objective   Blood pressure (!) 158/81, pulse 93, temperature 99.2 F (37.3 C), temperature source Oral, resp. rate 18, height 5\' 6"  (1.676 m), weight 66.8 kg, SpO2 97 %.    Vent Mode: CPAP;PSV FiO2 (%):  [30 %-60 %] 30 % Set Rate:  [15 bmp-20 bmp] 20 bmp Vt Set:  [510 mL] 510 mL PEEP:  [5 cmH20] 5 cmH20 Pressure Support:  [8 cmH20] 8 cmH20 Plateau  Pressure:  [11 cmH20-13 cmH20] 12 cmH20   Intake/Output Summary (Last 24 hours) at 07/25/2021 1238 Last data filed at 07/25/2021 1200 Gross per 24 hour  Intake 3211.24 ml  Output 817 ml  Net 2394.24 ml   Filed Weights   07/25/21 0405  Weight: 66.8 kg    Examination: General: Critically ill WDWN older adult M intubated sedated  HENT: NCAT pink mm ETT secure Lungs: Scattered rhonchi. Mechanically ventilated. Symmetrical chest expansion  Cardiovascular: bradycardic s1s2 cap refill brisk  Abdomen: soft ndnt + bowel sounds  Extremities: no acute joint deformity no cyanosis or clubbing Neuro: Sedation just shut off. Pinpoint pupils. Does not respond to pain, no cough/gag  GU: defer   Resolved Hospital Problem list     Assessment & Plan:   Acute toxic metabolic encephalopathy Intentional Overdose MDD with SI  intentional OD. Known Xanax, possibly tylenol given elevated acetaminophen levels on presentation.  Sounds like he was had shallow gurgling respirations out of hospital & there is mixed report of possible OOH pea arrest. With this, could have possible hypoxic encephalopathy as well. Is more awake and intermittently following commands this morning. No history of opiate use, will discontinue narcan gtt.  -UDS (though this may be confounded by the sedation given) - Continue to monitor  - Supportive care - Psych consult prior to discharge  Acute respiratory failure with hypoxia Suspected aspiration event  Tolerating PS; will likely extubate pending improvement of mental status  CXR without signs of aspiration PNA at this time  -full vent support  -Vap, pulm hygiene  Possible brief OOH arrest Bradycardia  HR possibly due to hypothermia vs medication related.   -supportive care  -ICU monitoring  Hyponatremia  P -NS 125cc/hr - Trend BMP  Best Practice (right click and "Reselect all SmartList Selections" daily)   Diet/type: tubefeeds DVT prophylaxis: prophylactic  heparin  GI prophylaxis: PPI Lines: N/A Foley:  Yes, and it is still needed Code Status:  full code -- I do see in 2020 Pt was DNR, at present will maintain full code by default until this can be addressed with family  Last date of multidisciplinary goals of care discussion [11/21 wife updated at bedside]  Labs   CBC: Recent Labs  Lab 07/24/21 1517 07/25/21 0444  WBC  --  8.4  HGB 12.9* 14.2  HCT 38.0* 39.7  MCV  --  85.6  PLT  --  672    Basic Metabolic Panel: Recent Labs  Lab 07/24/21 1244 07/24/21 1517 07/25/21 0444  NA 130* 130* 126*  K 4.1 4.5 3.5  CL 99  --  98  CO2 25  --  20*  GLUCOSE 97  --  137*  BUN 8  --  10  CREATININE 0.75  --  0.79  CALCIUM 7.9*  --  8.2*  MG  --   --  1.7   GFR: Estimated Creatinine Clearance: 75.3 mL/min (by C-G formula based on SCr of 0.79 mg/dL). Recent Labs  Lab 07/25/21 0444  WBC 8.4    Liver Function Tests: Recent Labs  Lab 07/24/21 1244  AST 18  ALT 19  ALKPHOS 60  BILITOT 0.7  PROT 5.9*  ALBUMIN 3.4*   Recent Labs  Lab 07/24/21 1244  LIPASE 32   No results for input(s): AMMONIA in the last 168 hours.  ABG    Component Value Date/Time   PHART 7.448 07/24/2021 1517   PCO2ART 36.7 07/24/2021 1517   PO2ART 248 (H) 07/24/2021 1517   HCO3 25.7 07/24/2021 1517   TCO2 27 07/24/2021 1517   O2SAT 100.0 07/24/2021 1517     Coagulation Profile: No results for input(s): INR, PROTIME in the last 168 hours.  Cardiac Enzymes: No results for input(s): CKTOTAL, CKMB, CKMBINDEX, TROPONINI in the last 168 hours.  HbA1C: No results found for: HGBA1C  CBG: Recent Labs  Lab 07/24/21 1941 07/24/21 2303 07/25/21 0345 07/25/21 0807 07/25/21 1123  GLUCAP 124* 123* 136* 139* 146*    Critical care time:      Harvie Heck, MD Internal Medicine, PGY-3 07/25/21 12:38 PM Pager # [phone]

## 2021-07-25 NOTE — Procedures (Signed)
Extubation Procedure Note  Patient Details:   Name: Terry Kemp DOB: 1945-10-01 MRN: [id number]   Airway Documentation:    Vent end date: 07/25/21 Vent end time: 1444   Evaluation  O2 sats: stable throughout Complications: No apparent complications Patient did tolerate procedure well. Bilateral Breath Sounds: Clear, Diminished   Patient extubated per MD order & placed on 4L [place]. Patient able to speak & cough post extubation.  Kathie Dike 07/25/2021, 2:49 PM

## 2021-07-25 NOTE — Progress Notes (Addendum)
[facility] Progress Note Patient Name: Terry Kemp DOB: Dec 07, 1945 MRN: [id number]   Date of Service  07/25/2021  HPI/Events of Note  NGT dislodge d/t agitation  eICU Interventions  KUB ordered. RN to notify elink when imaging completed.  9:03 PM - KUB reviewed. Advised RN to advance NGT 2cm. No repeat imaging indicated.     Intervention Category Minor Interventions: Other:  Anaijah Augsburger Rodman Pickle 07/25/2021, 8:12 PM

## 2021-07-25 NOTE — Progress Notes (Signed)
Patient extubated to 2L. Productive cough able to vocalize. Asks "What happened to me" Patient reoriented.

## 2021-07-25 NOTE — Progress Notes (Signed)
[facility] Progress Note Patient Name: Terry Kemp DOB: Jan 05, 1946 MRN: [id number]   Date of Service  07/25/2021  HPI/Events of Note  Hyponatremia - Na+ = 130 --> 126.  eICU Interventions  Plan: Increase 0.9 NaCl IV infusion to 125 mL/hour.     Intervention Category Major Interventions: Electrolyte abnormality - evaluation and management  Daron Stutz [place] 07/25/2021, 6:05 AM

## 2021-07-25 NOTE — Progress Notes (Signed)
[facility] ADULT ICU REPLACEMENT PROTOCOL   The patient does apply for the [facility] Adult ICU Electrolyte Replacment Protocol based on the criteria listed below:   1.Exclusion criteria: TCTS patients, ECMO patients, and Dialysis patients 2. Is GFR >/= 30 ml/min? Yes.    Patient's GFR today is >60 3. Is SCr </= 2? Yes.   Patient's SCr is 0.79 mg/dL 4. Did SCr increase >/= 0.5 in 24 hours? No. 5.Pt's weight >40kg  Yes.   6. Abnormal electrolyte(s):  K 3.5, Mg 1.7  7. Electrolytes replaced per protocol 8.  Call MD STAT for K+ </= 2.5, Phos </= 1, or Mag </= 1 Physician:  S. Rosie Fate R Loreda Silverio 07/25/2021 5:50 AM

## 2021-07-25 NOTE — Progress Notes (Addendum)
[facility] Progress Note Patient Name: Terry Kemp DOB: [birth year] MRN: [id number]   Date of Service  07/25/2021  HPI/Events of Note  Hypertension - BP = 168/91. Patient is NPO.  eICU Interventions  Plan: Hydralazine 10 mg IV Q 4 hours PRN SBP > 160 or DBP > 100.     Intervention Category Major Interventions: Hypertension - evaluation and management  [name] 07/25/2021, 4:11 AM

## 2021-07-25 NOTE — Progress Notes (Signed)
Advanced NGT 2 cm as advised by Dr. Loanne Drilling.   Lesle Reek, RN

## 2021-07-26 DIAGNOSIS — T424X2A Poisoning by benzodiazepines, intentional self-harm, initial encounter: Secondary | ICD-10-CM | POA: Diagnosis not present

## 2021-07-26 DIAGNOSIS — I2510 Atherosclerotic heart disease of native coronary artery without angina pectoris: Secondary | ICD-10-CM | POA: Diagnosis not present

## 2021-07-26 DIAGNOSIS — Z9861 Coronary angioplasty status: Secondary | ICD-10-CM

## 2021-07-26 DIAGNOSIS — G9340 Encephalopathy, unspecified: Secondary | ICD-10-CM | POA: Diagnosis not present

## 2021-07-26 DIAGNOSIS — J9621 Acute and chronic respiratory failure with hypoxia: Secondary | ICD-10-CM

## 2021-07-26 DIAGNOSIS — I10 Essential (primary) hypertension: Secondary | ICD-10-CM

## 2021-07-26 LAB — GLUCOSE, CAPILLARY
Glucose-Capillary: 115 mg/dL — ABNORMAL HIGH (ref 70–99)
Glucose-Capillary: 101 mg/dL — ABNORMAL HIGH (ref 70–99)
Glucose-Capillary: 121 mg/dL — ABNORMAL HIGH (ref 70–99)

## 2021-07-26 LAB — COMPREHENSIVE METABOLIC PANEL
ALT: 16 U/L (ref 0–44)
AST: 15 U/L (ref 15–41)
Albumin: 3.2 g/dL — ABNORMAL LOW (ref 3.5–5.0)
Alkaline Phosphatase: 50 U/L (ref 38–126)
Anion gap: 8 (ref 5–15)
BUN: <5 mg/dL — ABNORMAL LOW (ref 8–23)
CO2: 21 mmol/L — ABNORMAL LOW (ref 22–32)
Calcium: 8.3 mg/dL — ABNORMAL LOW (ref 8.9–10.3)
Chloride: 105 mmol/L (ref 98–111)
Creatinine, Ser: 0.66 mg/dL (ref 0.61–1.24)
GFR, Estimated: >60 mL/min (ref >60)
Glucose, Bld: 122 mg/dL — ABNORMAL HIGH (ref 70–99)
Potassium: 3.4 mmol/L — ABNORMAL LOW (ref 3.5–5.1)
Sodium: 134 mmol/L — ABNORMAL LOW (ref 135–145)
Total Bilirubin: 0.9 mg/dL (ref 0.3–1.2)
Total Protein: 5.7 g/dL — ABNORMAL LOW (ref 6.5–8.1)

## 2021-07-26 LAB — CBC
HCT: 38.1 % — ABNORMAL LOW (ref 39.0–52.0)
Hemoglobin: 13.6 g/dL (ref 13.0–17.0)
MCH: 31.3 pg (ref 26.0–34.0)
MCHC: 35.7 g/dL (ref 30.0–36.0)
MCV: 87.8 fL (ref 80.0–100.0)
Platelets: 183 10*3/uL (ref 150–400)
RBC: 4.34 MIL/uL (ref 4.22–5.81)
RDW: 12.2 % (ref 11.5–15.5)
WBC: 9 10*3/uL (ref 4.0–10.5)
nRBC: 0 % (ref 0.0–0.2)

## 2021-07-26 LAB — MAGNESIUM: Magnesium: 2.1 mg/dL (ref 1.7–2.4)

## 2021-07-26 MED ORDER — AMLODIPINE BESYLATE 10 MG PO TABS
10.0000 mg | ORAL_TABLET | Freq: Every day | ORAL | Status: DC
  Administered 2021-07-27 – 2021-07-30 (×4): 10 mg via ORAL
  Filled 2021-07-26 (×4): qty 1

## 2021-07-26 MED ORDER — METOPROLOL SUCCINATE ER 25 MG PO TB24
25.0000 mg | ORAL_TABLET | Freq: Every day | ORAL | Status: DC
  Administered 2021-07-27 – 2021-07-30 (×4): 25 mg via ORAL
  Filled 2021-07-26 (×4): qty 1

## 2021-07-26 MED ORDER — OLANZAPINE 10 MG IM SOLR
5.0000 mg | Freq: Every evening | INTRAMUSCULAR | Status: DC | PRN
  Filled 2021-07-26: qty 10

## 2021-07-26 MED ORDER — ASPIRIN EC 81 MG PO TBEC
81.0000 mg | DELAYED_RELEASE_TABLET | Freq: Every day | ORAL | Status: DC
  Administered 2021-07-27 – 2021-07-30 (×4): 81 mg via ORAL
  Filled 2021-07-26 (×4): qty 1

## 2021-07-26 MED ORDER — RAMIPRIL 5 MG PO CAPS
10.0000 mg | ORAL_CAPSULE | Freq: Every day | ORAL | Status: DC
  Administered 2021-07-27 – 2021-07-30 (×4): 10 mg via ORAL
  Filled 2021-07-26 (×4): qty 2

## 2021-07-26 MED ORDER — OLANZAPINE 5 MG PO TBDP
5.0000 mg | ORAL_TABLET | Freq: Every evening | ORAL | Status: DC | PRN
  Filled 2021-07-26: qty 1

## 2021-07-26 MED ORDER — POTASSIUM CHLORIDE 20 MEQ PO PACK
40.0000 meq | PACK | Freq: Once | ORAL | Status: AC
  Administered 2021-07-26: 40 meq
  Filled 2021-07-26: qty 2

## 2021-07-26 MED ORDER — ATORVASTATIN CALCIUM 40 MG PO TABS
40.0000 mg | ORAL_TABLET | Freq: Every day | ORAL | Status: DC
  Administered 2021-07-27 – 2021-07-30 (×4): 40 mg via ORAL
  Filled 2021-07-26 (×4): qty 1

## 2021-07-26 MED ORDER — CLONAZEPAM 1 MG PO TABS
1.0000 mg | ORAL_TABLET | Freq: Two times a day (BID) | ORAL | Status: AC
  Administered 2021-07-26 – 2021-07-27 (×4): 1 mg via ORAL
  Filled 2021-07-26 (×4): qty 1

## 2021-07-26 NOTE — Hospital Course (Addendum)
Terry Kemp is a 75 y.o. M with CAD s/p PCI >5 yrs, HTN, hx CVA with residual R weakness and anxiety who presented with intentional Xanax overdose.  Per H&P: "Per pt son the pt communicated to him at 1038 that desire not to live and that he had just taken several xanax. Son arrived to father at 57, where he was very drowsy but able to be aroused when he shook him at first, later being less responsive. EMS was dispatched, arriving at 1110 and found patient unresponsive and possibly pulseless (there are varying reports about if patient received brief CPR). Nasal trumped placed by EMS and given 1x narcan without improvement. BVM to ED.    In ED, unresponsive with snoring respirations and gurgling. Decision made to intubate."   11/20 Intubated and admitted 11/21 Extubated  11/22 Transferred to [facility]; psychiatry started Zyprexa, recommended BZD taper and recommended inpatient psychiatry

## 2021-07-26 NOTE — Evaluation (Signed)
Clinical/Bedside Swallow Evaluation Patient Details  Name: Terry Kemp MRN: [id number] Date of Birth: 05-12-1946  Today's Date: 07/26/2021 Time: SLP Start Time (ACUTE ONLY): 1100 SLP Stop Time (ACUTE ONLY): 1115 SLP Time Calculation (min) (ACUTE ONLY): 15 min  Past Medical History:  Past Medical History:  Diagnosis Date   Anxiety    Arthritis    CAD S/P percutaneous coronary angioplasty 01/2005   ST elevation MI -CATH/PCI-RCA (Cypher DES 3 x 28 - 3.67mm); CATH 2008 FOR INFERIOR ISCHEMIA - 80% dRCA ISR reduced to 40% with NTG - spasm.   Depression    Hyperlipidemia    Hypertension    Myocardial infarction [facility])    2 strokes   PAD (peripheral artery disease) ([place]) 2000   bilateral iliac arteries PTA  and  stenting in 04/30/1998; In 2006-subsequent  R Common Iliac (RCIA) 100% ISR, 75 % LCIA -- staged Bifuracation Kissing Stent creating new Aortic Carina.-- Overlapping 7 mm x 31mm & 24mm stents RCIA & kissing 7 mm x 29 mm in LCIA.   Stroke [facility]) 2013; 08/2015   a) Felt to be a TIA;; b) Imaging confirmed CVA - mild residual R-sided weakness   Past Surgical History:  Past Surgical History:  Procedure Laterality Date   ABDOMINAL AOTROGRAM  07/17/2007   ADDTION WITH CARDIAC CATH---(INFRARENAL WITH BIFEMORAL RUNOFF )----WIDELY PATENT STENTS TO LEFT AND RIGHT COMMON ILIACS   BACK SURGERY     metal plate   CARDIAC CATHETERIZATION  01/10/2005   Inf STEMI: dLM 50% - calcified. mRCA 100% (infaract-related vessel) --> DES PCI; AbdAo-Iliac Angio: PAD R Common Iliac (RCIA) 100% ISR, LCIA 75% ISR @ outflow tract of the left iliac. LVEF~ 60%   CARDIAC CATHETERIZATION  07/17/2007   40% dLM (previously read as 50%); 80% mRCA - distal stent ISR --> reduced to 40% with NTG = due to spasm--widely patent bilaeral common illiacs   CORONARY ANGIOPLASTY WITH STENT PLACEMENT  01/10/2005   mRCA DES PCI - CYPHER DES 3.0 x 28 mm -> post-dilated to 3.25 mm   EYE SURGERY Bilateral 2000   cataracts    lower extremities arterial doppler Bilateral 01/26/2005,09/28/2004   01/26/05-- right ABI -MODERATE,LEFT ABI MILLD -RIGHT CIA STENT OCCLUDED ,RIGHT IIA REVERSIBLE FLOW;LEFT CIA >70%--09/28/2004- RGT ABI'S 76, LFT ABI'S102   NM MYOCAR PERF EJECTION FRACTION  06/03/2007   REST/STRESS -- EF 69% MILD INFEROAPICAL ISCHEMIA NOTED - Referred for CATH - RCA PCI   NM MYOVIEW LTD  11/2014   Low Risk - "Diaphragmatic attenuation", no infarct or ischemia   peripheral abd aortic cath  04/04/2005   performed by Dr Rollene Fare---  LCIA 75% ISR, 100% RCIA ISR: kissing balloon PTA  --> Kissing STENTs Bilateral CIA (RCIA 2 overlapping 7.0 x 39 mm & 7.0 x 18 mm Gensis stents, LCIA - 73mm x 29 mm Gensis stent (noted to be patent in 2008 Cath)   peripheral arteries disease  09/1999   status post bilatera iliac stents jan 2001. three stents in the left common iliasc ,1 widely patent stent right common iliac, 2 overlapping stents revealed nio more 10% in-stent restenosis in 2008.    SPINAL FUSION  11/2014   SURGERY BY DR Ellene Route   TRANSTHORACIC ECHOCARDIOGRAM  09/2015   Normal LV size and systolic function, EF 40-34%. Normal RV size.  No valvular lesions.   HPI:  75 Y/O with history of depression presenting with intentional OD of xanax. Intubated 11/20, extubated 11/21. Passed [place]ale but exhibited delayed coughing.  Assessment / Plan / Recommendation  Clinical Impression  Pt demonstrates signs of mild but persistent changes to airway protection following brief intubation. Pt has hoarse vocal quality, slightly wet a times. Coughs occasionally with sips of thin liquids. Pt has a strong cough, is fully alert and generally improving. Recommend nectar thick liquids and regular solids today and SLP will f/u tomorrow for improvement and diet advancement vs need for instrumental testing if problems persist. RN aware. SLP Visit Diagnosis: Dysphagia, unspecified (R13.10)    Aspiration Risk  Mild aspiration risk    Diet  Recommendation Regular;Nectar-thick liquid   Liquid Administration via: Cup;Straw Medication Administration: Whole meds with liquid Supervision: Patient able to self feed Compensations: Slow rate;Small sips/bites;Clear throat intermittently Postural Changes: Seated upright at 90 degrees    Other  Recommendations Oral Care Recommendations: Oral care BID    Recommendations for follow up therapy are one component of a multi-disciplinary discharge planning process, led by the attending physician.  Recommendations may be updated based on patient status, additional functional criteria and insurance authorization.  Follow up Recommendations No SLP follow up      Assistance Recommended at Discharge None  Functional Status Assessment    Frequency and Duration            Prognosis        Swallow Study   General HPI: 75 Y/O with history of depression presenting with intentional OD of xanax. Intubated 11/20, extubated 11/21. Passed [place] but exhibited delayed coughing. Type of Study: Bedside Swallow Evaluation Previous Swallow Assessment: noen Diet Prior to this Study: Regular;Thin liquids Temperature Spikes Noted: No Respiratory Status: Room air History of Recent Intubation: Yes Length of Intubations (days): 2 days Date extubated: 07/25/21 Behavior/Cognition: Alert;Cooperative Oral Cavity Assessment: Within Functional Limits Oral Care Completed by SLP: No Oral Cavity - Dentition: Adequate natural dentition Self-Feeding Abilities: Able to feed self Patient Positioning: Upright in bed Baseline Vocal Quality: Wet Volitional Cough: Strong Volitional Swallow: Able to elicit    Oral/Motor/Sensory Function Overall Oral Motor/Sensory Function: Within functional limits   Ice Chips     Thin Liquid Thin Liquid: Impaired Presentation: Straw;Self Fed Pharyngeal  Phase Impairments: Cough - Immediate    Nectar Thick Nectar Thick Liquid: Within functional limits   Honey Thick Honey Thick  Liquid: Not tested   Puree Puree: Not tested   Solid     Solid: Within functional limits      Terry Kemp, Terry Kemp 07/26/2021,11:35 AM

## 2021-07-26 NOTE — Assessment & Plan Note (Signed)
At baseline patient is independent, has no cognitive impairment.  At admission, he was obtunded.  Acute toxic encephalopathy due to excessive Xanax ingestion.

## 2021-07-26 NOTE — Progress Notes (Signed)
[facility] ADULT ICU REPLACEMENT PROTOCOL   The patient does apply for the [facility] Adult ICU Electrolyte Replacment Protocol based on the criteria listed below:   1.Exclusion criteria: TCTS patients, ECMO patients, and Dialysis patients 2. Is GFR >/= 30 ml/min? Yes.    Patient's GFR today is >60 3. Is SCr </= 2? Yes.   Patient's SCr is 0.66 mg/dL 4. Did SCr increase >/= 0.5 in 24 hours? No. 5.Pt's weight >40kg  Yes.   6. Abnormal electrolyte(s):  K 3.4  7. Electrolytes replaced per protocol 8.  Call MD STAT for K+ </= 2.5, Phos </= 1, or Mag </= 1 Physician:  Braulio Conte R Emeri Estill 07/26/2021 2:55 AM

## 2021-07-26 NOTE — Consult Note (Addendum)
[facility] Consult   Reason for Consult:  Intentional Benzo overdose Referring Physician:  Myrene Buddy, MD Patient Identification: Terry Kemp MRN:  [id number] Principal Diagnosis: Intentional benzodiazepine overdose [facility]) Diagnosis:  Principal Problem:   Intentional benzodiazepine overdose ([place]) Active Problems:   CAD S/P RCA DES 2006   Essential hypertension   Suicide attempt ([place])   Acute on chronic respiratory failure with hypoxia ([place])   Hyponatremia   Hypokalemia   Acute encephalopathy  Assessment  Terry Kemp is a 75 y.o. male admitted medically for Intentional benzodiazepine overdose on 07/24/2021 12:33 PM .Patient carries the psychiatric diagnoses of MDD w/ previous SA via OD and anxiety and has a past medical history of PAD, HTN , and MI.Psychiatry was consulted for suicide attempt.   He meets criteria for MDD based on present assessment and EMR.  Outpatient psychotropic medications include Xanax for anxiety and patient reports he has ben taking this patient nightly for sleep, and his (ex)wife (lives with patient) endorses that he keeps them in his pocket for anxiety throughout the day. Historically he has also had Zoloft, Wellbutrin and TMS had a poor response to these medications and treatments. On initial examination, patient appears to endorse continued symptoms of depression as well as medication overuse regarding his Xanax. We plan to recommend that patient have inpatient psychiatry hospitalization, Benzo taper, and consider ECT treatment for depression .   Patient assessed today and endorses remorse regarding his attempt. Despite this patient appears to have very poor insight and judgement regarding his attempt. Patient was not able to complete DOWB, or calculate $2.75 into quarters indicating poor overall concentration. Patient did not appear to understand why it was recommended he go inpatient psych once medically stable, rather than home.  Patient does appear to have some delirium that is likely 2/2 to overdose, and this may be contributing to his poor judgement. Overall patient continues to appear high risk for repeat suicide attempt. Patient has no current medication regimen to address his depression and his family endorses that his depression has only worsened since [place] treatment. Patient himself, endorsed that his SA was on impulse, but he endorses consistent feelings of hopeless, anhedonia, poor sleep, and feeling hopeless. Patient also has a hx of SA via OD in the past. Unfortunately patient has documented hx of treatment refractory depression and due to this it is recommended that ECT be considered for patient. Overall patient's uncontrolled depression and reported dependence on Xanan remain concerning. It is recommended that patient be transferred to inpatient psych hospital for evaluation, crisis mgmt, and treatment intervention especially given his hx of treatment refractory depression.   MDD, recurrent, severe w/o psychotic features Benzodiazapine use disorder - Recommend benzo taper: Klonopin 1mg  BID for 48hrs, Klonopin 0.5mg  BID for 48 hrs, Klonopin 0.5 QHS for 1 week - Zyprexa 5mg  IM or PO QHS for agitation  Safety Recommend that patient be on routine observation. At this time he presents a low risk of harm to himself, based on assessment and EMR.   Legal Patient is his on Legal guardian and his son is his NOK.   Dispo Recommend inpatient psychiatry hospitalization when medically stable. Total Time spent with patient: 1 hour  Subjective:   Terry Kemp is a 75 y.o. male patient admitted with Intentional Benzo overdose.  HPI:  Patient seen this AM. Patient endorsed that he was not surprised to see psychiatry coming to assess him. Patient was Aox4. Patient reported that he was in the  hospital after attempting suicide by "Lorazepam overdose." Patient reports that he overdosed on Ativan but that he has been  prescribed Xanax. Patient reported that he has gotten Ativan from a doctor. Based on patient's PDMP it is likely that he is confusing Ativan and Xanax generic name's. Patient reports that he overdosed on impulse and does not recall anything making him especially upset the day he attempted. Patient reports that he is very regretful of his attempt. Patient reports that prior to his attempt he has had severe anhedonia, insomnia (and is only able to sleep with Xanax), feelings of hopelessness, and guilt. Patient denies active SI, HI, and AVH currently.   Patient reports that he would like to go home for Thanksgiving and began to talk about how he missed his son's graduation 2/2 due to his MI. Patient reported that his has made him upset ever since and he has not been able to forgive himself for this.   Collateral, (ex-wife): Patient's (ex)-wife was in patient's room and patient provided consent to speak with her. She reports that she continues to live with patient. She reports that she has been very concerned about patient and has already reached out to the [place] in [place] to have patient evaluated for inpatient psych hospitalization. She reports that patient has been very depressed since he had an MI a few years ago. She endorses that patient has appeared to have less energy and less motivated. She reports that patient has tried medications but did not respond well and apparently had adverse reactions. She reports that patient' attempted [place] earlier this year (2022) and did not respond (despite documented at least 20 sessions.) She reports that this is patient's second suicide attempt and she is concerned. She endorses that she has also seen patient taking his Xanax more throughout the day and has seen patient keep it in his pockets. She reports he does not feel safe with patient coming home.   Past Psychiatric History: Patient has seen at least 5 OP psychiatrist in the past. He has had poor response to Zoloft,  Wellbutrin and [place]. Patient has had documented adverse reactions to Zoloft and Wellbutrin.   Risk to Self:  Not in the hospital. Risk to Others:  NO Prior Inpatient Therapy:  Denies Prior Outpatient Therapy:  Yes  Past Medical History:  Past Medical History:  Diagnosis Date  . Anxiety   . Arthritis   . CAD S/P percutaneous coronary angioplasty 01/2005   ST elevation MI -CATH/PCI-RCA (Cypher DES 3 x 28 - 3.23mm); CATH 2008 FOR INFERIOR ISCHEMIA - 80% dRCA ISR reduced to 40% with NTG - spasm.  . Depression   . Hyperlipidemia   . Hypertension   . Myocardial infarction ([place])    2 strokes  . PAD (peripheral artery disease) ([place]) 2000   bilateral iliac arteries PTA  and  stenting in 04/30/1998; In 2006-subsequent  R Common Iliac (RCIA) 100% ISR, 75 % LCIA -- staged Bifuracation Kissing Stent creating new Aortic Carina.-- Overlapping 7 mm x 62mm & 47mm stents RCIA & kissing 7 mm x 29 mm in LCIA.  . Stroke [facility]) 2013; 08/2015   a) Felt to be a TIA;; b) Imaging confirmed CVA - mild residual R-sided weakness    Past Surgical History:  Procedure Laterality Date  . ABDOMINAL AOTROGRAM  07/17/2007   ADDTION WITH CARDIAC CATH---(INFRARENAL WITH BIFEMORAL RUNOFF )----WIDELY PATENT STENTS TO LEFT AND RIGHT COMMON ILIACS  . BACK SURGERY     metal plate  .  CARDIAC CATHETERIZATION  01/10/2005   Inf STEMI: dLM 50% - calcified. mRCA 100% (infaract-related vessel) --> DES PCI; AbdAo-Iliac Angio: PAD R Common Iliac (RCIA) 100% ISR, LCIA 75% ISR @ outflow tract of the left iliac. LVEF~ 60%  . CARDIAC CATHETERIZATION  07/17/2007   40% dLM (previously read as 50%); 80% mRCA - distal stent ISR --> reduced to 40% with NTG = due to spasm--widely patent bilaeral common illiacs  . CORONARY ANGIOPLASTY WITH STENT PLACEMENT  01/10/2005   mRCA DES PCI - CYPHER DES 3.0 x 28 mm -> post-dilated to 3.25 mm  . EYE SURGERY Bilateral 2000   cataracts  . lower extremities arterial doppler Bilateral 01/26/2005,09/28/2004    01/26/05-- right ABI -MODERATE,LEFT ABI MILLD -RIGHT CIA STENT OCCLUDED ,RIGHT IIA REVERSIBLE FLOW;LEFT CIA >70%--09/28/2004- RGT ABI'S 76, LFT ABI'S102  . NM MYOCAR PERF EJECTION FRACTION  06/03/2007   REST/STRESS -- EF 69% MILD INFEROAPICAL ISCHEMIA NOTED - Referred for CATH - RCA PCI  . NM MYOVIEW LTD  11/2014   Low Risk - "Diaphragmatic attenuation", no infarct or ischemia  . peripheral abd aortic cath  04/04/2005   performed by Dr Rollene Fare---  LCIA 75% ISR, 100% RCIA ISR: kissing balloon PTA  --> Kissing STENTs Bilateral CIA (RCIA 2 overlapping 7.0 x 39 mm & 7.0 x 18 mm Gensis stents, LCIA - 16mm x 29 mm Gensis stent (noted to be patent in 2008 Cath)  . peripheral arteries disease  09/1999   status post bilatera iliac stents jan 2001. three stents in the left common iliasc ,1 widely patent stent right common iliac, 2 overlapping stents revealed nio more 10% in-stent restenosis in 2008.   Marland Kitchen SPINAL FUSION  11/2014   SURGERY BY DR Ellene Route  . TRANSTHORACIC ECHOCARDIOGRAM  09/2015   Normal LV size and systolic function, EF 92-42%. Normal RV size.  No valvular lesions.   Family History:  Family History  Problem Relation Age of Onset  . Cancer Mother   . Heart disease Maternal Grandfather    Family Psychiatric  History: Did not assess, per EMR adopted Social History:  Social History   Substance and Sexual Activity  Alcohol Use Yes  . Alcohol/week: 14.0 standard drinks  . Types: 14 Glasses of wine per week     Social History   Substance and Sexual Activity  Drug Use No    Social History   Socioeconomic History  . Marital status: Married    Spouse name: Hisako  . Number of children: 2  . Years of education: college  . Highest education level: Not on file  Occupational History  . Not on file  Tobacco Use  . Smoking status: Former    Packs/day: 1.00    Years: 38.00    Pack years: 38.00    Types: Cigarettes    Quit date: 01/01/2017    Years since quitting: 4.5  . Smokeless  tobacco: Never  Vaping Use  . Vaping Use: Never used  Substance and Sexual Activity  . Alcohol use: Yes    Alcohol/week: 14.0 standard drinks    Types: 14 Glasses of wine per week  . Drug use: No  . Sexual activity: Not on file  Other Topics Concern  . Not on file  Social History Narrative   Lives with wife   Right handed   Drinks 2-3 cups caffeine daily   Social Determinants of Health   Financial Resource Strain: Not on file  Food Insecurity: Not on file  Transportation Needs:  Not on file  Physical Activity: Not on file  Stress: Not on file  Social Connections: Not on file   Additional Social History:    Allergies:   Allergies  Allergen Reactions  . Zoloft [Sertraline Hcl] Other (See Comments)    Psychosis per patient.    Labs:  Results for orders placed or performed during the hospital encounter of 07/24/21 (from the past 48 hour(s))  I-Stat arterial blood gas, ED     Status: Abnormal   Collection Time: 07/24/21  3:17 PM  Result Value Ref Range   pH, Arterial 7.448 7.350 - 7.450   pCO2 arterial 36.7 32.0 - 48.0 mmHg   pO2, Arterial 248 (H) 83.0 - 108.0 mmHg   Bicarbonate 25.7 20.0 - 28.0 mmol/L   TCO2 27 22 - 32 mmol/L   O2 Saturation 100.0 %   Acid-Base Excess 1.0 0.0 - 2.0 mmol/L   Sodium 130 (L) 135 - 145 mmol/L   Potassium 4.5 3.5 - 5.1 mmol/L   Calcium, Ion 1.12 (L) 1.15 - 1.40 mmol/L   HCT 38.0 (L) 39.0 - 52.0 %   Hemoglobin 12.9 (L) 13.0 - 17.0 g/dL   Patient temperature 95.9 F    Collection site RADIAL, ALLEN'S TEST ACCEPTABLE    Drawn by Operator    Sample type ARTERIAL   Acetaminophen level     Status: Abnormal   Collection Time: 07/24/21  6:00 PM  Result Value Ref Range   Acetaminophen (Tylenol), Serum 36 (H) 10 - 30 ug/mL    Comment: (NOTE) Therapeutic concentrations vary significantly. A range of 10-30 ug/mL  may be an effective concentration for many patients. However, some  are best treated at concentrations outside of this  range. Acetaminophen concentrations >150 ug/mL at 4 hours after ingestion  and >50 ug/mL at 12 hours after ingestion are often associated with  toxic reactions.  Performed at [facility], [place] [address], [place], [place] [address]   Glucose, capillary     Status: Abnormal   Collection Time: 07/24/21  7:41 PM  Result Value Ref Range   Glucose-Capillary 124 (H) 70 - 99 mg/dL    Comment: Glucose reference range applies only to samples taken after fasting for at least 8 hours.  Glucose, capillary     Status: Abnormal   Collection Time: 07/24/21 11:03 PM  Result Value Ref Range   Glucose-Capillary 123 (H) 70 - 99 mg/dL    Comment: Glucose reference range applies only to samples taken after fasting for at least 8 hours.  Glucose, capillary     Status: Abnormal   Collection Time: 07/25/21  3:45 AM  Result Value Ref Range   Glucose-Capillary 136 (H) 70 - 99 mg/dL    Comment: Glucose reference range applies only to samples taken after fasting for at least 8 hours.  Triglycerides     Status: Abnormal   Collection Time: 07/25/21  4:44 AM  Result Value Ref Range   Triglycerides 154 (H) <150 mg/dL    Comment: Performed at [facility] [address], [place] [address]  CBC     Status: None   Collection Time: 07/25/21  4:44 AM  Result Value Ref Range   WBC 8.4 4.0 - 10.5 K/uL   RBC 4.64 4.22 - 5.81 MIL/uL   Hemoglobin 14.2 13.0 - 17.0 g/dL   HCT 39.7 39.0 - 52.0 %   MCV 85.6 80.0 - 100.0 fL   MCH 30.6 26.0 - 34.0 pg  MCHC 35.8 30.0 - 36.0 g/dL   RDW 11.8 11.5 - 15.5 %   Platelets 195 150 - 400 K/uL   nRBC 0.0 0.0 - 0.2 %    Comment: Performed at [facility], [place] [address] [place], [place] [address]  Magnesium     Status: None   Collection Time: 07/25/21  4:44 AM  Result Value Ref Range   Magnesium 1.7 1.7 - 2.4 mg/dL    Comment: Performed at [facility] [address], [place], [place] [address]  Basic metabolic panel     Status: Abnormal    Collection Time: 07/25/21  4:44 AM  Result Value Ref Range   Sodium 126 (L) 135 - 145 mmol/L   Potassium 3.5 3.5 - 5.1 mmol/L   Chloride 98 98 - 111 mmol/L   CO2 20 (L) 22 - 32 mmol/L   Glucose, Bld 137 (H) 70 - 99 mg/dL    Comment: Glucose reference range applies only to samples taken after fasting for at least 8 hours.   BUN 10 8 - 23 mg/dL   Creatinine, Ser 0.79 0.61 - 1.24 mg/dL   Calcium 8.2 (L) 8.9 - 10.3 mg/dL   GFR, Estimated >60 >60 mL/min    Comment: (NOTE) Calculated using the CKD-EPI Creatinine Equation (2021)    Anion gap 8 5 - 15    Comment: Performed at [facility] [address] [place], [place] [address]  Glucose, capillary     Status: Abnormal   Collection Time: 07/25/21  8:07 AM  Result Value Ref Range   Glucose-Capillary 139 (H) 70 - 99 mg/dL    Comment: Glucose reference range applies only to samples taken after fasting for at least 8 hours.  Glucose, capillary     Status: Abnormal   Collection Time: 07/25/21 11:23 AM  Result Value Ref Range   Glucose-Capillary 146 (H) 70 - 99 mg/dL    Comment: Glucose reference range applies only to samples taken after fasting for at least 8 hours.  Glucose, capillary     Status: Abnormal   Collection Time: 07/25/21  3:57 PM  Result Value Ref Range   Glucose-Capillary 135 (H) 70 - 99 mg/dL    Comment: Glucose reference range applies only to samples taken after fasting for at least 8 hours.  Glucose, capillary     Status: Abnormal   Collection Time: 07/25/21  7:39 PM  Result Value Ref Range   Glucose-Capillary 143 (H) 70 - 99 mg/dL    Comment: Glucose reference range applies only to samples taken after fasting for at least 8 hours.  Glucose, capillary     Status: Abnormal   Collection Time: 07/25/21 11:48 PM  Result Value Ref Range   Glucose-Capillary 115 (H) 70 - 99 mg/dL    Comment: Glucose reference range applies only to samples taken after fasting for at least 8 hours.  Magnesium     Status: None    Collection Time: 07/26/21  1:48 AM  Result Value Ref Range   Magnesium 2.1 1.7 - 2.4 mg/dL    Comment: Performed at [facility] [address] [place], [place] [address]  CBC     Status: Abnormal   Collection Time: 07/26/21  1:48 AM  Result Value Ref Range   WBC 9.0 4.0 - 10.5 K/uL   RBC 4.34 4.22 - 5.81 MIL/uL   Hemoglobin 13.6 13.0 - 17.0 g/dL   HCT 38.1 (L) 39.0 - 52.0 %  MCV 87.8 80.0 - 100.0 fL   MCH 31.3 26.0 - 34.0 pg   MCHC 35.7 30.0 - 36.0 g/dL   RDW 12.2 11.5 - 15.5 %   Platelets 183 150 - 400 K/uL   nRBC 0.0 0.0 - 0.2 %    Comment: Performed at [facility], [place] [address] [place], [place] [address]  Comprehensive metabolic panel     Status: Abnormal   Collection Time: 07/26/21  1:48 AM  Result Value Ref Range   Sodium 134 (L) 135 - 145 mmol/L   Potassium 3.4 (L) 3.5 - 5.1 mmol/L   Chloride 105 98 - 111 mmol/L   CO2 21 (L) 22 - 32 mmol/L   Glucose, Bld 122 (H) 70 - 99 mg/dL    Comment: Glucose reference range applies only to samples taken after fasting for at least 8 hours.   BUN <5 (L) 8 - 23 mg/dL   Creatinine, Ser 0.66 0.61 - 1.24 mg/dL   Calcium 8.3 (L) 8.9 - 10.3 mg/dL   Total Protein 5.7 (L) 6.5 - 8.1 g/dL   Albumin 3.2 (L) 3.5 - 5.0 g/dL   AST 15 15 - 41 U/L   ALT 16 0 - 44 U/L   Alkaline Phosphatase 50 38 - 126 U/L   Total Bilirubin 0.9 0.3 - 1.2 mg/dL   GFR, Estimated >60 >60 mL/min    Comment: (NOTE) Calculated using the CKD-EPI Creatinine Equation (2021)    Anion gap 8 5 - 15    Comment: Performed at [facility], [place] [address], [place], [place] [address]  Glucose, capillary     Status: Abnormal   Collection Time: 07/26/21  3:44 AM  Result Value Ref Range   Glucose-Capillary 115 (H) 70 - 99 mg/dL    Comment: Glucose reference range applies only to samples taken after fasting for at least 8 hours.  Glucose, capillary     Status: Abnormal   Collection Time: 07/26/21  7:03 AM  Result Value Ref Range   Glucose-Capillary 101  (H) 70 - 99 mg/dL    Comment: Glucose reference range applies only to samples taken after fasting for at least 8 hours.  Glucose, capillary     Status: Abnormal   Collection Time: 07/26/21 10:55 AM  Result Value Ref Range   Glucose-Capillary 121 (H) 70 - 99 mg/dL    Comment: Glucose reference range applies only to samples taken after fasting for at least 8 hours.    Current Facility-Administered Medications  Medication Dose Route Frequency Provider Last Rate Last Admin  . Chlorhexidine Gluconate Cloth 2 % PADS 6 each  6 each Topical Daily Mannam, Praveen, MD   6 each at 07/26/21 1308  . docusate sodium (COLACE) capsule 100 mg  100 mg Oral BID PRN Cristal Generous, NP      . heparin injection 5,000 Units  5,000 Units Subcutaneous Q8H Bowser, Laurel Dimmer, NP   5,000 Units at 07/26/21 1308  . hydrALAZINE (APRESOLINE) injection 10-40 mg  10-40 mg Intravenous Q4H PRN Anders Simmonds, MD   20 mg at 07/25/21 1812  . labetalol (NORMODYNE) injection 10 mg  10 mg Intravenous Q2H PRN Olalere, Adewale A, MD   10 mg at 07/25/21 0206  . ondansetron (ZOFRAN) injection 4 mg  4 mg Intravenous Q6H PRN Bowser, Laurel Dimmer, NP      . polyethylene glycol (MIRALAX / GLYCOLAX) packet 17 g  17 g Per Tube Daily PRN Bowser, Laurel Dimmer, NP  Psychiatric Specialty Exam:  Presentation  General Appearance: Appropriate for Environment  Eye Contact:Good  Speech:Clear and Coherent  Speech Volume:Normal  Handedness:No data recorded  Mood and Affect  Mood:Dysphoric (embarassed)  Affect:Congruent   Thought Process  Thought Processes:Goal Directed  Descriptions of Associations:Intact  Orientation:Full (Time, Place and Person)  Thought Content:Logical  History of Schizophrenia/Schizoaffective disorder:No data recorded Duration of Psychotic Symptoms:No data recorded Hallucinations:Hallucinations: None  Ideas of Reference:None  Suicidal Thoughts:Suicidal Thoughts: No  Homicidal Thoughts:Homicidal Thoughts:  No   Sensorium  Memory:Immediate Good; Recent Good; Remote Good  Judgment:Impaired  Insight:None   Executive Functions  Concentration:Fair  Attention Span:Fair  Recall:No data recorded Fund of Knowledge:Fair  Language:Fair   Psychomotor Activity  Psychomotor Activity:Psychomotor Activity: Decreased; Psychomotor Retardation   Assets  Assets:Communication Skills; Social Support; Housing   Sleep  Sleep:Sleep: Poor   Physical Exam: Physical Exam HENT:     Head: Normocephalic and atraumatic.  Pulmonary:     Effort: Pulmonary effort is normal.  Neurological:     Mental Status: He is alert and oriented to person, place, and time.   Review of Systems  Psychiatric/Behavioral:  Positive for depression. Negative for hallucinations and suicidal ideas. The patient has insomnia.   Blood pressure (!) 142/89, pulse 78, temperature 98.2 F (36.8 C), temperature source Oral, resp. rate 15, height 5\' 6"  (1.676 m), weight 68.2 kg, SpO2 94 %. Body mass index is 24.27 kg/m.   PGY-2 Freida Busman, MD 07/26/2021 1:27 PM

## 2021-07-26 NOTE — Assessment & Plan Note (Addendum)
Extubated yesterday.  Mentation normal today.  Eating well, O2 sats normal on room air.  NG removed.  Medically stable for discharge.  Psychiatry recommend stopping Xanax.  I agree.  To do this safely, we will start a slow benzodiazepine taper, likely over weeks  - Start clonazepam 1 mg BID for at least the next week, to taper gradually over weeks after that  - Continue new Zyprexa - Consult Psychiatry, appreciate cares

## 2021-07-26 NOTE — Assessment & Plan Note (Signed)
Mild, asymptomatic.  No treatment necessary

## 2021-07-26 NOTE — Progress Notes (Signed)
Progress Note    Terry Kemp   OAC:[id number]  DOB: 11/28/1945  DOA: 07/24/2021     2 Date of Service: 07/26/2021       Brief summary: Terry Kemp is a 75 y.o. M with CAD s/p PCI >5 yrs, HTN, hx CVA with residual R weakness and anxiety who presented with intentional Xanax overdose.  Per H&P: "Per pt son the pt communicated to him at 1038 that desire not to live and that he had just taken several xanax. Son arrived to father at 76, where he was very drowsy but able to be aroused when he shook him at first, later being less responsive. EMS was dispatched, arriving at 1110 and found patient unresponsive and possibly pulseless (there are varying reports about if patient received brief CPR). Nasal trumped placed by EMS and given 1x narcan without improvement. BVM to ED.    In ED, unresponsive with snoring respirations and gurgling. Decision made to intubate."   11/20 Intubated and admitted 11/21 Extubated  11/22 Transferred to [facility]; psychiatry started Zyprexa, recommended BZD taper and recommended inpatient psychiatry       Assessment and Plan * Intentional benzodiazepine overdose ([place]) Extubated yesterday.  Mentation normal today.  Eating well, O2 sats normal on room air.  NG removed.  Medically stable for discharge.  Psychiatry recommend stopping Xanax.  I agree.  To do this safely, we will start a slow benzodiazepine taper, likely over weeks  - Start clonazepam 1 mg BID for at least the next week, to taper gradually over weeks after that  - Continue new Zyprexa - Consult Psychiatry, appreciate cares   Acute encephalopathy At baseline patient is independent, has no cognitive impairment.  At admission, he was obtunded.  Acute toxic encephalopathy due to excessive Xanax ingestion.    Suicide attempt [facility]) - Consult Psychiatry, who recommend inpatient psychiatric hospitalization - Consult TOC  Acute on chronic respiratory failure with hypoxia ([place]) Patient  became obtunded and hypoxic due to Xanax overdose.  Was intubated.  Now that ingestion has worn off, his respiratory failure is resolved.  CXR clear, weaned off O2.    Hypokalemia - Supplement K  Hyponatremia Mild, asymptomatic.  No treatment necessary  Essential hypertension - Resume amlodipine, BB, and ACEi tomorrow  CAD S/P RCA DES 2006 - Resume aspirin, atorvastatin, BB, and ACEi tomorrow  Elevated acetaminophen level Tylenol level nonzero, but NAC not started at admission, LFTs remained normal, no concern for Tylenol injury       Subjective:  No headache, dyspnea, confusion.  Sitting up trying to eat scrambled eggs.  Talking with psychiatry.  Oriented to person, place, and time.  Objective Vitals:   07/26/21 1154 07/26/21 1200 07/26/21 1300 07/26/21 1400  BP:  (!) 150/75 (!) 142/89   Pulse: 73 75 78   Resp: 15 15 15 13   Temp:      TempSrc:      SpO2: 90% 94% 94%   Weight:      Height:       68.2 kg  Vital signs reviewed and remarkable for slight hypertension.   Exam General appearance: Adult male, sitting up in bed, no acute distress, interactive, 20 prefaced     HEENT: Anicteric, conjunctival pink, lids and lashes normal.  Has some scant clearish nasal discharge around his NG tube.  Oropharynx tacky dry, lips dry Skin: No suspicious rashes or lesions Cardiac: RRR, no murmurs, no lower extremity edema Respiratory: Respiratory rate and rhythm, lungs clear without rales  or wheezes Abdomen: Abdomen soft, without tenderness palpation or guarding, no ascites or distention MSK: Normal muscle bulk and tone for age Neuro: Awake, responds to questions, moves upper extremities with generalized weakness but symmetric strength, speech fluent, face symmetric Psych: Attention normal, makes eye contact, affect blunted, judgment and insight appear normal    Labs / Other Information My review of labs, imaging, notes and other tests is significant for Sodium 134, potassium  3.4, normal renal function, normal LFTs, chest x-ray clear     Disposition Plan: Status is: Inpatient  Remains inpatient appropriate because: Attempted suicide and psychiatry recommended inpatient psychiatric treatment.  He is cleared medically for discharge, and can resume all his home medicines at this time.  Consult to [facility] for inpatient psych placement. Called to wife, no answer        Time spent: 35 minutes [facility] 07/26/2021, 3:14 PM

## 2021-07-26 NOTE — Evaluation (Signed)
Physical Therapy Evaluation Patient Details Name: Terry Kemp MRN: [id number] DOB: 1946/04/29 Today's Date: 07/26/2021  History of Present Illness  75 yo admitted 11/20 after Xanax OD. Intubated 11/20-11/21. PMhx: suicidal ideation, CAD, PAD, HTN, CVA, depression, HLD  Clinical Impression  Pt pleasant but confused regarding time, place and situation. Pt with decreased strength, balance, transfers and gait who will benefit from acute therapy to maximize mobility and independence to decrease burden of care. Pt lives with ex-wife and is normally able to care for himself. Ex-wife present throughout session and aware of physical assist currently needed and agreeable to provide at home. Encouraged OOB for meals and increased mobility with nursing assist.   90-95% on RA HR 70     Recommendations for follow up therapy are one component of a multi-disciplinary discharge planning process, led by the attending physician.  Recommendations may be updated based on patient status, additional functional criteria and insurance authorization.  Follow Up Recommendations Home health PT    Assistance Recommended at Discharge Frequent or constant Supervision/Assistance  Functional Status Assessment Patient has had a recent decline in their functional status and demonstrates the ability to make significant improvements in function in a reasonable and predictable amount of time.  Equipment Recommendations  Rolling walker (2 wheels);BSC/3in1    Recommendations for Other Services OT consult     Precautions / Restrictions Precautions Precautions: Fall Restrictions Weight Bearing Restrictions: No      Mobility  Bed Mobility Overal bed mobility: Needs Assistance Bed Mobility: Supine to Sit     Supine to sit: Min assist;HOB elevated     General bed mobility comments: HOB 15 degrees with cues and assist for elevating trunk from surface. increased time to scoot to EOB    Transfers Overall  transfer level: Needs assistance   Transfers: Sit to/from Stand Sit to Stand: Min assist           General transfer comment: cues for hand placement with assist to rise from surface    Ambulation/Gait Ambulation/Gait assistance: Min assist Gait Distance (Feet): 150 Feet Assistive device: Rolling walker (2 wheels) Gait Pattern/deviations: Step-through pattern;Decreased stride length;Trunk flexed   Gait velocity interpretation: 1.31 - 2.62 ft/sec, indicative of limited community ambulator   General Gait Details: cues for posture and proximity to RW with decreased safety and 2 episodes of posterior LOB requiring assist to recover.  Stairs            Wheelchair Mobility    Modified Rankin (Stroke Patients Only)       Balance Overall balance assessment: Needs assistance   Sitting balance-Leahy Scale: Fair Sitting balance - Comments: pt able to sit statically but decreased control with reaching foot for sock     Standing balance-Leahy Scale: Poor Standing balance comment: bil Ue support in standing                             Pertinent Vitals/Pain Pain Assessment: No/denies pain    Home Living Family/patient expects to be discharged to:: Private residence Living Arrangements: Non-relatives/Friends Available Help at Discharge: Available 24 hours/day;Friend(s) Type of Home: House Home Access: Stairs to enter Entrance Stairs-Rails: Right Entrance Stairs-Number of Steps: 3 Alternate Level Stairs-Number of Steps: 13 Home Layout: Two level;Able to live on main level with bedroom/bathroom Home Equipment: Rollator (4 wheels);Cane - single point      Prior Function Prior Level of Function : Independent/Modified Independent  Mobility Comments: pt walks 20 min per day and normally cares for himseld and assists with laundry without physical assist       Hand Dominance        Extremity/Trunk Assessment   Upper Extremity  Assessment Upper Extremity Assessment: Generalized weakness    Lower Extremity Assessment Lower Extremity Assessment: Generalized weakness    Cervical / Trunk Assessment Cervical / Trunk Assessment: Kyphotic  Communication   Communication: No difficulties  Cognition Arousal/Alertness: Awake/alert Behavior During Therapy: WFL for tasks assessed/performed Overall Cognitive Status: Impaired/Different from baseline Area of Impairment: Orientation;Memory;Safety/judgement                 Orientation Level: Situation;Time;Place   Memory: Decreased short-term memory   Safety/Judgement: Decreased awareness of deficits;Decreased awareness of safety              General Comments      Exercises     Assessment/Plan    PT Assessment Patient needs continued PT services  PT Problem List Decreased strength;Decreased mobility;Decreased safety awareness;Decreased activity tolerance;Decreased balance;Decreased knowledge of use of DME;Decreased cognition       PT Treatment Interventions Gait training;Balance training;Stair training;Functional mobility training;Cognitive remediation;Therapeutic activities;Patient/family education;Therapeutic exercise;DME instruction    PT Goals (Current goals can be found in the Care Plan section)  Acute Rehab PT Goals Patient Stated Goal: return home for Thanksgiving dinner PT Goal Formulation: With patient/family Time For Goal Achievement: 08/09/21 Potential to Achieve Goals: Good    Frequency Min 3X/week   Barriers to discharge        Co-evaluation               AM-PAC PT "6 Clicks" Mobility  Outcome Measure Help needed turning from your back to your side while in a flat bed without using bedrails?: A Little Help needed moving from lying on your back to sitting on the side of a flat bed without using bedrails?: A Little Help needed moving to and from a bed to a chair (including a wheelchair)?: A Little Help needed standing up  from a chair using your arms (e.g., wheelchair or bedside chair)?: A Little Help needed to walk in hospital room?: A Little Help needed climbing 3-5 steps with a railing? : A Lot 6 Click Score: 17    End of Session Equipment Utilized During Treatment: Gait belt Activity Tolerance: Patient tolerated treatment well Patient left: in chair;with call bell/phone within reach;with family/visitor present;with nursing/sitter in room Nurse Communication: Mobility status PT Visit Diagnosis: Other abnormalities of gait and mobility (R26.89);Difficulty in walking, not elsewhere classified (R26.2);Muscle weakness (generalized) (M62.81)    Time: 1696-7893 PT Time Calculation (min) (ACUTE ONLY): 23 min   Charges:   PT Evaluation $PT Eval Moderate Complexity: 1 Mod PT Treatments $Gait Training: 8-22 mins        Ajene Carchi P, PT Acute Rehabilitation Services Pager: [phone] Office: [place] 07/26/2021, 11:59 AM

## 2021-07-26 NOTE — Assessment & Plan Note (Signed)
Resolved with supplementation and starting spironolactone. 

## 2021-07-26 NOTE — Assessment & Plan Note (Signed)
-   Resume amlodipine, BB, and ACEi tomorrow

## 2021-07-26 NOTE — Assessment & Plan Note (Signed)
Patient became obtunded and hypoxic due to Xanax overdose.  Was intubated.  Now that ingestion has worn off, his respiratory failure is resolved.  CXR clear, weaned off O2.

## 2021-07-26 NOTE — Progress Notes (Addendum)
CSW contacted by Dr Candie Chroman regarding need for inpatient [facility] treatment.  [facility]  CSW spoke with April at [facility] [phone]) regarding need for inpt psych and she can add pt to their wait list for available beds.  Per April, beds are "limited" but CSW can check back daily to check the status.  Weekends/holidays would need to check back with [facility] at [phone].  April also provided the following info: PCP is through [place]: Dr Mina Marble.  CSW is [place], [phone].  Rayburn Go is the mental health CSW who can arrange outpt mental appts at [place]: [phone].  Lurline Idol, MSW, LCSW 11/22/20222:59 PM    CSW messaged Mariea Clonts at [facility] regarding help with making referrals to other gero psych facilities.  Initial referral to [facility] gero psych unit. Lurline Idol, MSW, LCSW 11/22/20223:01 PM

## 2021-07-26 NOTE — Assessment & Plan Note (Signed)
-   Consult Psychiatry, who recommend inpatient psychiatric hospitalization - Consult TOC

## 2021-07-26 NOTE — Care Plan (Signed)
Called also to son Corene Cornea, went straight to VM.

## 2021-07-26 NOTE — Assessment & Plan Note (Signed)
-   Resume aspirin, atorvastatin, BB, and ACEi tomorrow

## 2021-07-27 ENCOUNTER — Inpatient Hospital Stay (HOSPITAL_COMMUNITY): Payer: Medicare Other

## 2021-07-27 DIAGNOSIS — G9340 Encephalopathy, unspecified: Secondary | ICD-10-CM | POA: Diagnosis not present

## 2021-07-27 DIAGNOSIS — T50902A Poisoning by unspecified drugs, medicaments and biological substances, intentional self-harm, initial encounter: Secondary | ICD-10-CM

## 2021-07-27 DIAGNOSIS — T1491XA Suicide attempt, initial encounter: Secondary | ICD-10-CM | POA: Diagnosis not present

## 2021-07-27 LAB — BASIC METABOLIC PANEL
Anion gap: 8 (ref 5–15)
BUN: 6 mg/dL — ABNORMAL LOW (ref 8–23)
CO2: 22 mmol/L (ref 22–32)
Calcium: 8.6 mg/dL — ABNORMAL LOW (ref 8.9–10.3)
Chloride: 104 mmol/L (ref 98–111)
Creatinine, Ser: 0.72 mg/dL (ref 0.61–1.24)
GFR, Estimated: >60 mL/min (ref >60)
Glucose, Bld: 96 mg/dL (ref 70–99)
Potassium: 3.5 mmol/L (ref 3.5–5.1)
Sodium: 134 mmol/L — ABNORMAL LOW (ref 135–145)

## 2021-07-27 LAB — MAGNESIUM: Magnesium: 1.9 mg/dL (ref 1.7–2.4)

## 2021-07-27 MED ORDER — CLONAZEPAM 0.5 MG PO TABS: 0.5000 mg | ORAL_TABLET | Freq: Every day | ORAL | Status: DC

## 2021-07-27 MED ORDER — CLONAZEPAM 0.5 MG PO TABS
0.5000 mg | ORAL_TABLET | Freq: Two times a day (BID) | ORAL | Status: AC
  Administered 2021-07-28 – 2021-07-29 (×4): 0.5 mg via ORAL
  Filled 2021-07-27 (×4): qty 1

## 2021-07-27 NOTE — Progress Notes (Signed)
Modified Barium Swallow Progress Note  Patient Details  Name: Terry Kemp MRN: [id number] Date of Birth: 01-04-1946  Today's Date: 07/27/2021  Modified Barium Swallow completed.  Full report located under Chart Review in the Imaging Section.  Brief recommendations include the following:  Clinical Impression  Pt presents with oropharyngeal dysphagia characterized by impaired bolus cohesion, a pharyngeal delay, reduced hyolaryngeal elevation, and reduced anterior laryngeal movement. He demonstrated premature spillage to the pyriform sinuses, mild vallecular residue, and pyriform sinus residue. Penetration (PAS 5) and intermittent aspiration (PAS 7,8) of thin liquids were noted during the swallow secondary to premature spillage and the pharyngeal delay. Penetration (PAS 3) was noted after the swallow secondary to spillover of pyriform sinus residue to the larynx. No instances of penetration/aspiration were noted with nectar thick liquids or with solids. Immediate laryngeal invasion was eliminated with individual boluses of thin liquids via cup and risk of penetration/aspiration after the swallow would likely be reduced/eliminated with secondary swallows; however, pt exhibited difficulty consistently demonstrating this strategy. Pt's cough was inconsistent and propelled the aspirate superiorly along the laryngeal surface of the epiglottis; it did not fully expel aspirate, but was effective with trace penetrate. It is recommended that the pt's current diet of regular texture solids and nectar thick liquids be continued. However, pt may have floor-stock thin liquids with strict observance of swallowing precautions and full supervision to ensure their observance.   Swallow Evaluation Recommendations       SLP Diet Recommendations: Regular solids;Nectar thick liquid   Liquid Administration via: Cup;No straw   Medication Administration: Whole meds with puree   Supervision: Full  supervision/cueing for compensatory strategies;Patient able to self feed   Compensations: Slow rate;Small sips/bites;Hard cough after swallow;Multiple dry swallows after each bite/sip   Postural Changes: Seated upright at 90 degrees   Oral Care Recommendations: Oral care BID      Desarea Ohagan I. Hardin Negus, [place], [place] Office number [phone] Pager [place] 07/27/2021,4:09 PM

## 2021-07-27 NOTE — Progress Notes (Signed)
Speech Language Pathology Treatment: Dysphagia  Patient Details Name: Terry Kemp MRN: [id number] DOB: 1946-03-18 Today's Date: 07/27/2021 Time: 8022-3361 SLP Time Calculation (min) (ACUTE ONLY): 15 min  Assessment / Plan / Recommendation Clinical Impression  Pt was seen for dysphagia treatment with his wife present. He was alert, requested thin coffee, and expressed his displeasure regarding his being on thickened liquids. Pt demonstrated throat clearing, wet vocal quality, and intermittent coughing with thin liquids via cup and straw. Pt reported that at baseline he demonstrates coughing with p.o. intake, but he denied this occurring frequently and stated that it was "normal". A modified barium swallow study is recommended at this time and it is scheduled for today at 1400.    HPI HPI: 75 Y/O with history of depression presenting with intentional OD of xanax. Intubated 11/20, extubated 11/21. Passed [place]ale but exhibited delayed coughing.      SLP Plan  MBS      Recommendations for follow up therapy are one component of a multi-disciplinary discharge planning process, led by the attending physician.  Recommendations may be updated based on patient status, additional functional criteria and insurance authorization.    Recommendations  Diet recommendations: Regular;Nectar-thick liquid Liquids provided via: Cup;Straw Medication Administration: Whole meds with liquid Supervision: Patient able to self feed Compensations: Slow rate;Small sips/bites;Clear throat intermittently                Oral Care Recommendations: Oral care BID Follow Up Recommendations: No SLP follow up Assistance recommended at discharge: None SLP Visit Diagnosis: Dysphagia, unspecified (R13.10) Plan: MBS       Terry Kemp I. Terry Kemp, [place], [place] Office number [phone] Pager [place]  07/27/2021, 11:52 AM

## 2021-07-27 NOTE — Evaluation (Signed)
Occupational Therapy Evaluation Patient Details Name: Terry Kemp MRN: [id number] DOB: [birth year] Today's Date: 07/27/2021   History of Present Illness 75 yo admitted 11/20 after Xanax OD. Intubated 11/20-11/21. PMhx: suicidal ideation, CAD, PAD, HTN, CVA, depression, HLD   Clinical Impression   PTA patient reports independent. Admitted for above and limited by impaired balance, decreased activity tolerance and generalized weakness. He was oriented, following commands with increased time but noted decreased STM, problem solving and safety awareness. Pt reports feeling "off" today. Pt reports spouse is at side, plans to dc home with her at dc (question due to per chart exwife?).  Pt completing mobility and transfers with min assist, up to mod assist for ADLs. Patient will benefit from continued OT services acutely and after dc at [facility] level given constant support for safety. Will follow acutely.      Recommendations for follow up therapy are one component of a multi-disciplinary discharge planning process, led by the attending physician.  Recommendations may be updated based on patient status, additional functional criteria and insurance authorization.   Follow Up Recommendations  Home health OT    Assistance Recommended at Discharge Frequent or constant Supervision/Assistance  Functional Status Assessment  Patient has had a recent decline in their functional status and demonstrates the ability to make significant improvements in function in a reasonable and predictable amount of time.  Equipment Recommendations  BSC/3in1    Recommendations for Other Services       Precautions / Restrictions Precautions Precautions: Fall Restrictions Weight Bearing Restrictions: No      Mobility Bed Mobility Overal bed mobility: Needs Assistance Bed Mobility: Supine to Sit;Sit to Supine     Supine to sit: Min assist Sit to supine: Min assist   General bed mobility comments: for trunk  support, increased time    Transfers Overall transfer level: Needs assistance Equipment used: None Transfers: Sit to/from Stand Sit to Stand: Min assist           General transfer comment: cues for hand placement with assist to rise from surface      Balance Overall balance assessment: Needs assistance   Sitting balance-Leahy Scale: Fair Sitting balance - Comments: limited dynamically during ADLS   Standing balance support: No upper extremity supported;During functional activity;Single extremity supported Standing balance-Leahy Scale: Poor Standing balance comment: relies on external support                           ADL either performed or assessed with clinical judgement   ADL Overall ADL's : Needs assistance/impaired     Grooming: Min guard;Standing;Wash/dry face       Lower Body Bathing: Minimal assistance;Sit to/from stand   Upper Body Dressing : Minimal assistance;Sitting   Lower Body Dressing: Moderate assistance;Sit to/from stand Lower Body Dressing Details (indicate cue type and reason): assist for socks, min assist in standing Toilet Transfer: Minimal assistance;Ambulation Toilet Transfer Details (indicate cue type and reason): simulated in room         Functional mobility during ADLs: Minimal assistance       Vision         Perception     Praxis      Pertinent Vitals/Pain Pain Assessment: No/denies pain     Hand Dominance Right   Extremity/Trunk Assessment Upper Extremity Assessment Upper Extremity Assessment: Generalized weakness   Lower Extremity Assessment Lower Extremity Assessment: Defer to PT evaluation       Communication Communication Communication:  No difficulties   Cognition Arousal/Alertness: Awake/alert Behavior During Therapy: WFL for tasks assessed/performed Overall Cognitive Status: Impaired/Different from baseline Area of Impairment: Awareness;Problem solving;Safety/judgement;Following  commands;Memory;Attention                   Current Attention Level: Sustained Memory: Decreased short-term memory Following Commands: Follows one step commands consistently;Follows one step commands with increased time Safety/Judgement: Decreased awareness of safety;Decreased awareness of deficits Awareness: Emergent Problem Solving: Slow processing;Requires verbal cues General Comments: pt following simple commands, requires cueing for problem sovling and awareness.  cueing for safety     General Comments  VSS on RA, spouse present and supportive    Exercises     Shoulder Instructions      Home Living Family/patient expects to be discharged to:: Private residence Living Arrangements: Spouse/significant other Available Help at Discharge: Available 24 hours/day;Family Type of Home: House Home Access: Stairs to enter [employer] of Steps: 3 Entrance Stairs-Rails: Right Home Layout: Two level;Able to live on main level with bedroom/bathroom Alternate Level Stairs-Number of Steps: 13   Bathroom Shower/Tub: Occupational psychologist: Handicapped height     Home Equipment: Rollator (4 wheels);Cane - single point          Prior Functioning/Environment Prior Level of Function : Independent/Modified Independent             Mobility Comments: pt walks 20 min per day and normally cares for himseld and assists with laundry without physical assist ADLs Comments: independent and driving        OT Problem List: Decreased strength;Decreased activity tolerance;Impaired balance (sitting and/or standing);Decreased cognition;Decreased safety awareness;Decreased knowledge of use of DME or AE;Decreased knowledge of precautions      OT Treatment/Interventions: Self-care/ADL training;Therapeutic exercise;DME and/or AE instruction;Energy conservation;Therapeutic activities;Balance training;Patient/family education;Cognitive remediation/compensation    OT  Goals(Current goals can be found in the care plan section) Acute Rehab OT Goals Patient Stated Goal: not stated OT Goal Formulation: With patient Time For Goal Achievement: 08/10/21 Potential to Achieve Goals: Good  OT Frequency: Min 2X/week   Barriers to D/C:            Co-evaluation              AM-PAC OT "6 Clicks" Daily Activity     Outcome Measure Help from another person eating meals?: A Little Help from another person taking care of personal grooming?: A Little Help from another person toileting, which includes using toliet, bedpan, or urinal?: A Little Help from another person bathing (including washing, rinsing, drying)?: A Lot Help from another person to put on and taking off regular upper body clothing?: A Little Help from another person to put on and taking off regular lower body clothing?: A Lot 6 Click Score: 16   End of Session Equipment Utilized During Treatment: Gait belt Nurse Communication: Mobility status  Activity Tolerance: Patient tolerated treatment well Patient left: in bed;with call bell/phone within reach;with bed alarm set;with nursing/sitter in room;with family/visitor present  OT Visit Diagnosis: Other abnormalities of gait and mobility (R26.89);Muscle weakness (generalized) (M62.81)                Time: 7628-3151 OT Time Calculation (min): 35 min Charges:  OT General Charges $OT Visit: 1 Visit OT Evaluation $OT Eval Moderate Complexity: 1 Mod OT Treatments $Self Care/Home Management : 8-22 mins  Jolaine Artist, OT Acute Rehabilitation Services Pager [phone] Office [phone]   Delight Stare 07/27/2021, 12:27 PM

## 2021-07-27 NOTE — Progress Notes (Signed)
[facility]  PROGRESS NOTE  Terry Kemp:[id number] DOB: 1946/08/19 DOA: 07/24/2021 PCP: Lawerance Cruel, MD   Brief HPI:   75 year old male with history of CAD s/p PCI greater than 5 years, hypertension, history of CVA with residual right-sided weakness and anxiety presented with intentional Xanax overdose.  Per H&P: "Per pt son the pt communicated to him at 1038 that desire not to live and that he had just taken several xanax. Son arrived to father at 75, where he was very drowsy but able to be aroused when he shook him at first, later being less responsive. EMS was dispatched, arriving at 1110 and found patient unresponsive and possibly pulseless (there are varying reports about if patient received brief CPR). Nasal trumped placed by EMS and given 1x narcan without improvement. BVM to ED.    In ED, unresponsive with snoring respirations and gurgling. Decision made to intubate."     11/20 Intubated and admitted 11/21 Extubated  11/22 Transferred to [facility]; psychiatry started Zyprexa, recommended BZD taper and recommended inpatient psychiatry  Subjective   Patient seen and examined, denies any complaints this morning.   Assessment/Plan:     Intentional benzodiazepine overdose -Patient was intubated, extubated on 07/25/2021 -NG tube removed -Medically stable for discharge -Psych recommended stopping Xanax; started on clonazepam taper over the next 2 weeks -Continue Zyprexa per psychiatry recommendations -Patient is to go to inpatient psych facility -If he resists going to facility, psych recommends to make him IVC  Acute encephalopathy -Resolved -Secondary to benzodiazepine overdose  Suicide attempt -Psych consulted, recommended inpatient psych hospitalization -Awaiting bed at inpatient psych facility  Acute on chronic hypoxic respiratory failure -Patient became obtunded and hypoxic due to Xanax overdose -Required intubation -Respiratory failure is  resolved -Weaned off oxygen  Hyponatremia -Mild, improving  Hypertension -Blood pressure mildly elevated -Home medications have been resumed -We will closely monitor patient's blood pressure  CAD s/p RCA DES 2006 -Continue aspirin, amlodipine metoprolol  Elevated acetaminophen level -Tylenol level was 36 on admission; mildly elevated -Not treated -No signs of liver injury       Medications     amLODipine  10 mg Oral Daily   aspirin EC  81 mg Oral Daily   atorvastatin  40 mg Oral Daily   Chlorhexidine Gluconate Cloth  6 each Topical Daily   [START ON 07/28/2021] clonazePAM  0.5 mg Oral BID   Followed by   Derrill Memo ON 07/30/2021] clonazePAM  0.5 mg Oral QHS   clonazePAM  1 mg Oral BID   heparin  5,000 Units Subcutaneous Q8H   metoprolol succinate  25 mg Oral Daily   ramipril  10 mg Oral Daily     Data Reviewed:   CBG:  Recent Labs  Lab 07/25/21 1939 07/25/21 2348 07/26/21 0344 07/26/21 0703 07/26/21 1055  GLUCAP 143* 115* 115* 101* 121*    SpO2: 94 % O2 Flow Rate (L/min): 2 L/min FiO2 (%): 30 %    Vitals:   07/27/21 0759 07/27/21 0800 07/27/21 1200 07/27/21 1600  BP:  (!) 167/90    Pulse:  96    Resp:  15    Temp: 98.1 F (36.7 C)  97.7 F (36.5 C) 97.9 F (36.6 C)  TempSrc:   Axillary Oral  SpO2:  94%    Weight:      Height:         Intake/Output Summary (Last 24 hours) at 07/27/2021 1904 Last data filed at 07/27/2021 0700 Gross per 24 hour  Intake 240 ml  Output 1500 ml  Net -1260 ml    11/22 0701 - 11/23 1900 In: 938.9 [P.O.:520; I.V.:418.9] Out: 2125 [Urine:2125]  Filed Weights   07/25/21 0405 07/26/21 0431  Weight: 66.8 kg 68.2 kg    Data Reviewed: Basic Metabolic Panel: Recent Labs  Lab 07/24/21 1244 07/24/21 1517 07/25/21 0444 07/26/21 0148 07/27/21 0141  NA 130* 130* 126* 134* 134*  K 4.1 4.5 3.5 3.4* 3.5  CL 99  --  98 105 104  CO2 25  --  20* 21* 22  GLUCOSE 97  --  137* 122* 96  BUN 8  --  10 <5* 6*   CREATININE 0.75  --  0.79 0.66 0.72  CALCIUM 7.9*  --  8.2* 8.3* 8.6*  MG  --   --  1.7 2.1 1.9   Liver Function Tests: Recent Labs  Lab 07/24/21 1244 07/26/21 0148  AST 18 15  ALT 19 16  ALKPHOS 60 50  BILITOT 0.7 0.9  PROT 5.9* 5.7*  ALBUMIN 3.4* 3.2*   Recent Labs  Lab 07/24/21 1244  LIPASE 32   No results for input(s): AMMONIA in the last 168 hours. CBC: Recent Labs  Lab 07/24/21 1517 07/25/21 0444 07/26/21 0148  WBC  --  8.4 9.0  HGB 12.9* 14.2 13.6  HCT 38.0* 39.7 38.1*  MCV  --  85.6 87.8  PLT  --  195 183   Cardiac Enzymes: No results for input(s): CKTOTAL, CKMB, CKMBINDEX, TROPONINI in the last 168 hours. BNP (last 3 results) No results for input(s): BNP in the last 8760 hours.  ProBNP (last 3 results) No results for input(s): PROBNP in the last 8760 hours.  CBG: Recent Labs  Lab 07/25/21 1939 07/25/21 2348 07/26/21 0344 07/26/21 0703 07/26/21 1055  GLUCAP 143* 115* 115* 101* 121*       Radiology Reports  DG Abd 1 View  Result Date: 07/25/2021 CLINICAL DATA:  NG tube placement EXAM: ABDOMEN - 1 VIEW COMPARISON:  Chest radiograph 07/25/2021. FINDINGS: Single view of the lower chest and upper abdomen demonstrates a gastric tube with tip and side port below the diaphragm, overlying the stomach. The imaged bowel gas pattern is normal. Supine technique limits evaluation for intraperitoneal free air. The imaged lungs are clear. IMPRESSION: 1. Gastric tube tip and side port below the diaphragm, overlying the stomach. 2. No acute abnormality in the imaged chest or upper abdomen. Electronically Signed   By: Merilyn Baba M.D.   On: 07/25/2021 20:26     DVT prophylaxis: Heparin  Code Status: Full code  Family Communication: No family at bedside   Consultants: Psychiatry  Procedures:     Objective    Physical Examination:   General-appears in no acute distress Heart-S1-S2, regular, no murmur auscultated Lungs-clear to auscultation  bilaterally, no wheezing or crackles auscultated Abdomen-soft, nontender, no organomegaly Extremities-no edema in the lower extremities Neuro-alert, oriented x3, no focal deficit noted  Status is: Inpatient  Dispo: The patient is from: Home              Anticipated d/c is to: Inpatient psych facility              Anticipated d/c date is: 07/31/2021              Patient currently not stable for discharge  Barrier to discharge-awaiting transfer to inpatient psych facility  COVID-19 Labs  No results for input(s): DDIMER, FERRITIN, LDH, CRP in the last 72 hours.  Lab Results  Component Value Date   SARSCOV2NAA NEGATIVE 07/24/2021   SARSCOV2NAA POSITIVE (A) 06/26/2019            Recent Results (from the past 240 hour(s))  Resp Panel by RT-PCR (Flu A&B, Covid) Nasopharyngeal Swab     Status: None   Collection Time: 07/24/21 12:45 PM   Specimen: Nasopharyngeal Swab; Nasopharyngeal(NP) swabs in vial transport medium  Result Value Ref Range Status   SARS Coronavirus 2 by RT PCR NEGATIVE NEGATIVE Final    Comment: (NOTE) SARS-CoV-2 target nucleic acids are NOT DETECTED.  The SARS-CoV-2 RNA is generally detectable in upper respiratory specimens during the acute phase of infection. The lowest concentration of SARS-CoV-2 viral copies this assay can detect is 138 copies/mL. A negative result does not preclude SARS-Cov-2 infection and should not be used as the sole basis for treatment or other patient management decisions. A negative result may occur with  improper specimen collection/handling, submission of specimen other than nasopharyngeal swab, presence of viral mutation(s) within the areas targeted by this assay, and inadequate number of viral copies(<138 copies/mL). A negative result must be combined with clinical observations, patient history, and epidemiological information. The expected result is Negative.  Fact Sheet for Patients:   EntrepreneurPulse.com.au  Fact Sheet for Healthcare Providers:  IncredibleEmployment.be  This test is no t yet approved or cleared by the [place] FDA and  has been authorized for detection and/or diagnosis of SARS-CoV-2 by FDA under an Emergency Use Authorization (EUA). This EUA will remain  in effect (meaning this test can be used) for the duration of the COVID-19 declaration under Section 564(b)(1) of the Act, 21 U.S.C.section 360bbb-3(b)(1), unless the authorization is terminated  or revoked sooner.       Influenza A by PCR NEGATIVE NEGATIVE Final   Influenza B by PCR NEGATIVE NEGATIVE Final    Comment: (NOTE) The Xpert Xpress SARS-CoV-2/FLU/RSV plus assay is intended as an aid in the diagnosis of influenza from Nasopharyngeal swab specimens and should not be used as a sole basis for treatment. Nasal washings and aspirates are unacceptable for Xpert Xpress SARS-CoV-2/FLU/RSV testing.  Fact Sheet for Patients: EntrepreneurPulse.com.au  Fact Sheet for Healthcare Providers: IncredibleEmployment.be  This test is not yet approved or cleared by the [place] FDA and has been authorized for detection and/or diagnosis of SARS-CoV-2 by FDA under an Emergency Use Authorization (EUA). This EUA will remain in effect (meaning this test can be used) for the duration of the COVID-19 declaration under Section 564(b)(1) of the Act, 21 U.S.C. section 360bbb-3(b)(1), unless the authorization is terminated or revoked.  Performed at [facility], [place] [address], [place], [place] [address]     [facility] If 7PM-7AM, please contact night-coverage at [email or web address], Office  [phone]   07/27/2021, 7:04 PM  LOS: 3 days

## 2021-07-27 NOTE — Progress Notes (Signed)
Speech Language Pathology Treatment: Dysphagia  Patient Details Name: Terry Kemp MRN: [id number] DOB: 09/10/1945 Today's Date: 07/27/2021 Time: 2841-3244 SLP Time Calculation (min) (ACUTE ONLY): 26 min  Assessment / Plan / Recommendation Clinical Impression  Pt was seen for dysphagia treatment with his son present. Pt and his son were educated regarding the results of the modified barium swallow study, diet recommendations, and swallowing precautions. Video recording of the study was used to facilitate education and both parties verbalized understanding regarding all areas of education. Pt expressed his displeasure regarding his having thickened liquids. He verbalized agreement with the allowance of limited floor-stock thin liquids with strict observance of swallowing precautions. He demonstrated understanding of precautions using teach back. He required moderate cues for use of secondary swallows and a hard cough after each individual swallow. However, he independently used secondary swallows after the cough and did not require cues for avoidance of consecutive swallows. Pt's RN has been advised of recommendations and swallowing precautions. SLP will continue to follow pt.     HPI HPI: 75 Y/O with history of depression presenting with intentional OD of xanax. Intubated 11/20, extubated 11/21. Passed [place] but exhibited delayed coughing.      SLP Plan  Continue with current plan of care      Recommendations for follow up therapy are one component of a multi-disciplinary discharge planning process, led by the attending physician.  Recommendations may be updated based on patient status, additional functional criteria and insurance authorization.    Recommendations  Diet recommendations: Regular;Nectar-thick liquid Liquids provided via: Cup;No straw Medication Administration: Whole meds with liquid Supervision: Patient able to self feed Compensations: Slow rate;Small sips/bites;Hard  cough after swallow;Multiple dry swallows after each bite/sip Postural Changes and/or Swallow Maneuvers: Seated upright 90 degrees                Oral Care Recommendations: Oral care BID Follow Up Recommendations: Home health SLP Assistance recommended at discharge: None SLP Visit Diagnosis: Dysphagia, oropharyngeal phase (R13.12) Plan: Continue with current plan of care       Terry Kemp I. Hardin Negus, [place], [place] Office number [phone] Pager [place]  07/27/2021, 4:19 PM

## 2021-07-27 NOTE — Consult Note (Signed)
[place] [facility] Consult   Reason for Consult:   Intentional Benzo overdose Referring Physician:  Myrene Buddy, MD Patient Identification: Terry Kemp MRN:  [id number] Principal Diagnosis: Intentional benzodiazepine overdose [facility]) Diagnosis:  Principal Problem:   Intentional benzodiazepine overdose ([place]) Active Problems:   CAD S/P RCA DES 2006   Essential hypertension   Suicide attempt ([place])   Acute on chronic respiratory failure with hypoxia ([place])   Hyponatremia   Hypokalemia   Acute encephalopathy  Assessment  Terry Kemp is a 75 y.o. male admitted medically for Intentional benzodiazepine overdose on 07/24/2021 12:33 PM .Patient carries the psychiatric diagnoses of MDD w/ previous SA via OD and anxiety and has a past medical history of PAD, HTN , and MI.Psychiatry was consulted for suicide attempt.   He meets criteria for MDD based on present assessment and EMR.  Outpatient psychotropic medications include Xanax for anxiety and patient reports he has ben taking this patient nightly for sleep, and his (ex)wife (lives with patient) endorses that he keeps them in his pocket for anxiety throughout the day. Historically he has also had Zoloft, Wellbutrin and TMS had a poor response to these medications and treatments. On initial examination, patient appears to endorse continued symptoms of depression as well as medication overuse regarding his Xanax. We plan to recommend that patient have inpatient psychiatry hospitalization, Benzo taper, and consider ECT treatment for depression .   Patient continues to endorse regret regarding his attempt and remains unable to recall a specific reason and continues to endorse that the attempt was impulse. Patient remains high risk for repeat attempt as his judgement and insight overall remains poor. Patient continues to struggle with understanding why he is being recommended he go inpatient. Patient appears to have treatment  refractory depression and is sensitive to psychotropic medications requiring observation should he be started on medication or if another treatment ie ECT is recommended. Patient does appear to be losing awareness of his surroundings; albeit slowly. Would like to decrease his risk for developing ICU delirium.   MDD, recurrent, severe w/o psychotic features Benzodiazapine use disorder - Continue benzo taper: Klonopin 1mg  BID for 48hrs, Klonopin 0.5mg  BID for 48 hrs, Klonopin 0.5 QHS for 1 week - Zyprexa 5mg  IM or PO QHS PRN for agitation  Delirium recs 1. Avoid benzodiazepines, antihistamines, anticholinergics, and minimize opiate use as these may worsen delirium. 2: Assess, prevent and manage pain as lack of treatment can result in delirium.  3: Provide appropriate lighting and clear signage; a clock and calendar should be easily visible to the patient. 4: Monitor environmental factors. Reduce light and noise at night (close shades, turn off lights, turn off TV, ect). Correct any alterations in sleep cycle. 5: Reorient the patient to person, place, time and situation on each encounter.  6: Correct sensory deficits if possible (replace eye glasses, hearing aids, ect). 7: Avoid restraints if able. Severely delirious patients benefit from constant observation by a sitter.    Safety Recommend that patient be continues on 1:1 observation.    Legal Patient is his on Legal guardian and his son is his NOK. Patient continues to think about inpatient and is slowly adjusting to the idea; however if placement is found and patient is unwilling to go would recommend IVC.   Dispo Recommend inpatient psychiatry hospitalization when medically stable. Total Time spent with patient: 15 minutes  Subjective:   Terry Kemp is a 75 y.o. male patient admitted with Intentional Benzo overdose.Marland Kitchen  HPI:  Patietn seen this AM. Patient arousable to verbal stimulation. Patient was Aox4. Patient reported that he  did not sleep well overnight but continues to eat well. Patient reported that has been spending most of his time thinking about his suicide attempt and is disappointed in himself. Patient reported that he believes he has been depressed "since I gave my house away." Patient reports that he had to give his house away because he was no longer able to care for himself. Patient endorsed that he does believe that more recently his mental health has been taking a toll on his physical health and he agrees with his (ex)-wife's similar assessment.   In the PM patient was seen again with attending. Patient appeared to be slower to answer questions a bit more confused but able to reoriented. Patient was noted to be lying a dark room and was not aware that he had a sitter in his room. Patient reported "I don't really care if I stay awake or go to sleep." Once patient was made aware that he had someone to talk to he endorsed that he would try to stay up longer and talk with the sitter. Patient also endorsed that he understand that it was being recommend he go to an inpatient psych hospital, but still endorsed some apprehension but appeared to more accepting that this was his next step.   Past Psychiatric History: Patient has seen at least 5 OP psychiatrist in the past. He has had poor response to Zoloft, Wellbutrin and [place]. Patient has had documented adverse reactions to Zoloft and Wellbutrin.   Past Medical History:  Past Medical History:  Diagnosis Date   Anxiety    Arthritis    CAD S/P percutaneous coronary angioplasty 01/2005   ST elevation MI -CATH/PCI-RCA (Cypher DES 3 x 28 - 3.66mm); CATH 2008 FOR INFERIOR ISCHEMIA - 80% dRCA ISR reduced to 40% with NTG - spasm.   Depression    Hyperlipidemia    Hypertension    Myocardial infarction [facility])    2 strokes   PAD (peripheral artery disease) ([place]) 2000   bilateral iliac arteries PTA  and  stenting in 04/30/1998; In 2006-subsequent  R Common Iliac (RCIA) 100%  ISR, 75 % LCIA -- staged Bifuracation Kissing Stent creating new Aortic Carina.-- Overlapping 7 mm x 8mm & 15mm stents RCIA & kissing 7 mm x 29 mm in LCIA.   Stroke [facility]) 2013; 08/2015   a) Felt to be a TIA;; b) Imaging confirmed CVA - mild residual R-sided weakness    Past Surgical History:  Procedure Laterality Date   ABDOMINAL AOTROGRAM  07/17/2007   ADDTION WITH CARDIAC CATH---(INFRARENAL WITH BIFEMORAL RUNOFF )----WIDELY PATENT STENTS TO LEFT AND RIGHT COMMON ILIACS   BACK SURGERY     metal plate   CARDIAC CATHETERIZATION  01/10/2005   Inf STEMI: dLM 50% - calcified. mRCA 100% (infaract-related vessel) --> DES PCI; AbdAo-Iliac Angio: PAD R Common Iliac (RCIA) 100% ISR, LCIA 75% ISR @ outflow tract of the left iliac. LVEF~ 60%   CARDIAC CATHETERIZATION  07/17/2007   40% dLM (previously read as 50%); 80% mRCA - distal stent ISR --> reduced to 40% with NTG = due to spasm--widely patent bilaeral common illiacs   CORONARY ANGIOPLASTY WITH STENT PLACEMENT  01/10/2005   mRCA DES PCI - CYPHER DES 3.0 x 28 mm -> post-dilated to 3.25 mm   EYE SURGERY Bilateral 2000   cataracts   lower extremities arterial doppler Bilateral 01/26/2005,09/28/2004   01/26/05-- right ABI -  MODERATE,LEFT ABI MILLD -RIGHT CIA STENT OCCLUDED ,RIGHT IIA REVERSIBLE FLOW;LEFT CIA >70%--09/28/2004- RGT ABI'S 76, LFT ABI'S102   NM MYOCAR PERF EJECTION FRACTION  06/03/2007   REST/STRESS -- EF 69% MILD INFEROAPICAL ISCHEMIA NOTED - Referred for CATH - RCA PCI   NM MYOVIEW LTD  11/2014   Low Risk - "Diaphragmatic attenuation", no infarct or ischemia   peripheral abd aortic cath  04/04/2005   performed by Dr Rollene Fare---  LCIA 75% ISR, 100% RCIA ISR: kissing balloon PTA  --> Kissing STENTs Bilateral CIA (RCIA 2 overlapping 7.0 x 39 mm & 7.0 x 18 mm Gensis stents, LCIA - 42mm x 29 mm Gensis stent (noted to be patent in 2008 Cath)   peripheral arteries disease  09/1999   status post bilatera iliac stents jan 2001. three stents in  the left common iliasc ,1 widely patent stent right common iliac, 2 overlapping stents revealed nio more 10% in-stent restenosis in 2008.    SPINAL FUSION  11/2014   SURGERY BY DR Ellene Route   TRANSTHORACIC ECHOCARDIOGRAM  09/2015   Normal LV size and systolic function, EF 52-77%. Normal RV size.  No valvular lesions.   Family History:  Family History  Problem Relation Age of Onset   Cancer Mother    Heart disease Maternal Grandfather    Family Psychiatric  History: Per EMR adopted Social History:  Social History   Substance and Sexual Activity  Alcohol Use Yes   Alcohol/week: 14.0 standard drinks   Types: 14 Glasses of wine per week     Social History   Substance and Sexual Activity  Drug Use No    Social History   Socioeconomic History   Marital status: Married    Spouse name: Hisako   Number of children: 2   Years of education: college   Highest education level: Not on file  Occupational History   Not on file  Tobacco Use   Smoking status: Former    Packs/day: 1.00    Years: 38.00    Pack years: 38.00    Types: Cigarettes    Quit date: 01/01/2017    Years since quitting: 4.5   Smokeless tobacco: Never  Vaping Use   Vaping Use: Never used  Substance and Sexual Activity   Alcohol use: Yes    Alcohol/week: 14.0 standard drinks    Types: 14 Glasses of wine per week   Drug use: No   Sexual activity: Not on file  Other Topics Concern   Not on file  Social History Narrative   Lives with wife   Right handed   Drinks 2-3 cups caffeine daily   Social Determinants of Health   Financial Resource Strain: Not on file  Food Insecurity: Not on file  Transportation Needs: Not on file  Physical Activity: Not on file  Stress: Not on file  Social Connections: Not on file   Additional Social History:    Allergies:   Allergies  Allergen Reactions   Zoloft [Sertraline Hcl] Other (See Comments)    Psychosis per patient.    Labs:  Results for orders placed or  performed during the hospital encounter of 07/24/21 (from the past 48 hour(s))  Glucose, capillary     Status: Abnormal   Collection Time: 07/25/21  3:57 PM  Result Value Ref Range   Glucose-Capillary 135 (H) 70 - 99 mg/dL    Comment: Glucose reference range applies only to samples taken after fasting for at least 8 hours.  Glucose, capillary  Status: Abnormal   Collection Time: 07/25/21  7:39 PM  Result Value Ref Range   Glucose-Capillary 143 (H) 70 - 99 mg/dL    Comment: Glucose reference range applies only to samples taken after fasting for at least 8 hours.  Glucose, capillary     Status: Abnormal   Collection Time: 07/25/21 11:48 PM  Result Value Ref Range   Glucose-Capillary 115 (H) 70 - 99 mg/dL    Comment: Glucose reference range applies only to samples taken after fasting for at least 8 hours.  Magnesium     Status: None   Collection Time: 07/26/21  1:48 AM  Result Value Ref Range   Magnesium 2.1 1.7 - 2.4 mg/dL    Comment: Performed at [facility] [address] [place], [place] [address]  CBC     Status: Abnormal   Collection Time: 07/26/21  1:48 AM  Result Value Ref Range   WBC 9.0 4.0 - 10.5 K/uL   RBC 4.34 4.22 - 5.81 MIL/uL   Hemoglobin 13.6 13.0 - 17.0 g/dL   HCT 38.1 (L) 39.0 - 52.0 %   MCV 87.8 80.0 - 100.0 fL   MCH 31.3 26.0 - 34.0 pg   MCHC 35.7 30.0 - 36.0 g/dL   RDW 12.2 11.5 - 15.5 %   Platelets 183 150 - 400 K/uL   nRBC 0.0 0.0 - 0.2 %    Comment: Performed at [facility], [place] [address] [place], [place] [address]  Comprehensive metabolic panel     Status: Abnormal   Collection Time: 07/26/21  1:48 AM  Result Value Ref Range   Sodium 134 (L) 135 - 145 mmol/L   Potassium 3.4 (L) 3.5 - 5.1 mmol/L   Chloride 105 98 - 111 mmol/L   CO2 21 (L) 22 - 32 mmol/L   Glucose, Bld 122 (H) 70 - 99 mg/dL    Comment: Glucose reference range applies only to samples taken after fasting for at least 8 hours.   BUN <5 (L) 8 - 23 mg/dL   Creatinine,  Ser 0.66 0.61 - 1.24 mg/dL   Calcium 8.3 (L) 8.9 - 10.3 mg/dL   Total Protein 5.7 (L) 6.5 - 8.1 g/dL   Albumin 3.2 (L) 3.5 - 5.0 g/dL   AST 15 15 - 41 U/L   ALT 16 0 - 44 U/L   Alkaline Phosphatase 50 38 - 126 U/L   Total Bilirubin 0.9 0.3 - 1.2 mg/dL   GFR, Estimated >60 >60 mL/min    Comment: (NOTE) Calculated using the CKD-EPI Creatinine Equation (2021)    Anion gap 8 5 - 15    Comment: Performed at [facility], [place] [address], [place], [place] [address]  Glucose, capillary     Status: Abnormal   Collection Time: 07/26/21  3:44 AM  Result Value Ref Range   Glucose-Capillary 115 (H) 70 - 99 mg/dL    Comment: Glucose reference range applies only to samples taken after fasting for at least 8 hours.  Glucose, capillary     Status: Abnormal   Collection Time: 07/26/21  7:03 AM  Result Value Ref Range   Glucose-Capillary 101 (H) 70 - 99 mg/dL    Comment: Glucose reference range applies only to samples taken after fasting for at least 8 hours.  Glucose, capillary     Status: Abnormal   Collection Time: 07/26/21 10:55 AM  Result Value Ref Range   Glucose-Capillary 121 (H) 70 - 99 mg/dL  Comment: Glucose reference range applies only to samples taken after fasting for at least 8 hours.  Basic metabolic panel     Status: Abnormal   Collection Time: 07/27/21  1:41 AM  Result Value Ref Range   Sodium 134 (L) 135 - 145 mmol/L   Potassium 3.5 3.5 - 5.1 mmol/L   Chloride 104 98 - 111 mmol/L   CO2 22 22 - 32 mmol/L   Glucose, Bld 96 70 - 99 mg/dL    Comment: Glucose reference range applies only to samples taken after fasting for at least 8 hours.   BUN 6 (L) 8 - 23 mg/dL   Creatinine, Ser 0.72 0.61 - 1.24 mg/dL   Calcium 8.6 (L) 8.9 - 10.3 mg/dL   GFR, Estimated >60 >60 mL/min    Comment: (NOTE) Calculated using the CKD-EPI Creatinine Equation (2021)    Anion gap 8 5 - 15    Comment: Performed at [facility] [address], [place], [place] [address]  Magnesium      Status: None   Collection Time: 07/27/21  1:41 AM  Result Value Ref Range   Magnesium 1.9 1.7 - 2.4 mg/dL    Comment: Performed at [facility] [address], [place], [place] [address]    Current Facility-Administered Medications  Medication Dose Route Frequency Provider Last Rate Last Admin   amLODipine (NORVASC) tablet 10 mg  10 mg Oral Daily Edwin Dada, MD   10 mg at 07/27/21 0957   aspirin EC tablet 81 mg  81 mg Oral Daily Edwin Dada, MD   81 mg at 07/27/21 0957   atorvastatin (LIPITOR) tablet 40 mg  40 mg Oral Daily Edwin Dada, MD   40 mg at 07/27/21 0957   Chlorhexidine Gluconate Cloth 2 % PADS 6 each  6 each Topical Daily Mannam, [place] Robinsons, MD   6 each at 07/27/21 0957   clonazePAM (KLONOPIN) tablet 1 mg  1 mg Oral BID Edwin Dada, MD   1 mg at 07/27/21 0957   docusate sodium (COLACE) capsule 100 mg  100 mg Oral BID PRN Cristal Generous, NP       heparin injection 5,000 Units  5,000 Units Subcutaneous Q8H Cristal Generous, NP   5,000 Units at 07/27/21 0626   hydrALAZINE (APRESOLINE) injection 10-40 mg  10-40 mg Intravenous Q4H PRN Anders Simmonds, MD   20 mg at 07/27/21 0410   metoprolol succinate (TOPROL-XL) 24 hr tablet 25 mg  25 mg Oral Daily Edwin Dada, MD   25 mg at 07/27/21 0957   OLANZapine (ZYPREXA) injection 5 mg  5 mg Intramuscular QHS PRN,MR X 1 Trase Bunda B, MD       Or   OLANZapine zydis (ZYPREXA) disintegrating tablet 5 mg  5 mg Oral QHS PRN,MR X 1 Roquel Burgin B, MD       ondansetron (ZOFRAN) injection 4 mg  4 mg Intravenous Q6H PRN Bowser, Laurel Dimmer, NP       polyethylene glycol (MIRALAX / GLYCOLAX) packet 17 g  17 g Per Tube Daily PRN Bowser, Laurel Dimmer, NP       ramipril (ALTACE) capsule 10 mg  10 mg Oral Daily Danford, Suann Larry, MD   10 mg at 07/27/21 0957     Psychiatric Specialty Exam:  Presentation  General Appearance: Appropriate for Environment  Eye Contact:Minimal (keeps eyes closed  most of exam, but open intermittently)  Speech:Clear and Coherent  Speech  Volume:Normal  Handedness:No data recorded  Mood and Affect  Mood:Dysphoric (embarassed)  Affect:Congruent   Thought Process  Thought Processes:Goal Directed  Descriptions of Associations:Intact  Orientation:Full (Time, Place and Person)  Thought Content:Logical  History of Schizophrenia/Schizoaffective disorder:No data recorded Duration of Psychotic Symptoms:No data recorded Hallucinations:Hallucinations: None  Ideas of Reference:None  Suicidal Thoughts:Suicidal Thoughts: No  Homicidal Thoughts:Homicidal Thoughts: No   Sensorium  Memory:Immediate Good; Recent Good; Remote Good  Judgment:Impaired  Insight:None   Executive Functions  Concentration:Fair  Attention Span:Fair  Recall:No data recorded Fund of Knowledge:Fair  Language:Fair   Psychomotor Activity  Psychomotor Activity:Psychomotor Activity: Decreased; Psychomotor Retardation   Assets  Assets:Communication Skills; Social Support; Housing   Sleep  Sleep:Sleep: Poor   Physical Exam: Physical Exam HENT:     Head: Normocephalic and atraumatic.  Pulmonary:     Effort: Pulmonary effort is normal.  Skin:    General: Skin is dry.  Neurological:     Mental Status: He is alert and oriented to person, place, and time.   Review of Systems  Psychiatric/Behavioral:  Negative for hallucinations and suicidal ideas.   Blood pressure (!) 167/90, pulse 96, temperature 98.1 F (36.7 C), resp. rate 15, height 5\' 6"  (1.676 m), weight 68.2 kg, SpO2 94 %. Body mass index is 24.27 kg/m.  PGY-2 Freida Busman, MD 07/27/2021 12:33 PM

## 2021-07-27 NOTE — Progress Notes (Signed)
Physical Therapy Treatment Patient Details Name: Terry Kemp MRN: [id number] DOB: [birth year] Today's Date: 07/27/2021   History of Present Illness 75 yo admitted 11/20 after Xanax OD. Intubated 11/20-11/21. PMhx: suicidal ideation, CAD, PAD, HTN, CVA, depression, HLD    PT Comments    Pt progressing well toward goals.  Emphasis on transitions and progression of gait with RW, but without assist.    Recommendations for follow up therapy are one component of a multi-disciplinary discharge planning process, led by the attending physician.  Recommendations may be updated based on patient status, additional functional criteria and insurance authorization.  Follow Up Recommendations  Home health PT     Assistance Recommended at Discharge Frequent or constant Supervision/Assistance  Equipment Recommendations  Rolling walker (2 wheels);BSC/3in1    Recommendations for Other Services       Precautions / Restrictions Precautions Precautions: Fall     Mobility  Bed Mobility Overal bed mobility: Needs Assistance Bed Mobility: Supine to Sit;Sit to Supine     Supine to sit: Min assist Sit to supine: Min assist   General bed mobility comments: assist trunk up/forward and assist LE's and trunk control to suping.    Transfers Overall transfer level: Needs assistance Equipment used: None Transfers: Sit to/from Stand Sit to Stand: Min assist           General transfer comment: cues for hand placement with assist to rise from surface    Ambulation/Gait Ambulation/Gait assistance: Min guard Gait Distance (Feet): 400 Feet Assistive device: Rolling walker (2 wheels) Gait Pattern/deviations: Step-through pattern;Decreased step length - right;Decreased step length - left;Decreased stride length   Gait velocity interpretation: 1.31 - 2.62 ft/sec, indicative of limited community ambulator   General Gait Details: cues for posture and proximity to the RW, cues to increase  speed.  No assist needed, Shorter, slow gait.   Stairs             Wheelchair Mobility    Modified Rankin (Stroke Patients Only)       Balance     Sitting balance-Leahy Scale: Fair       Standing balance-Leahy Scale: Poor Standing balance comment: relies on AD or external support                            Cognition Arousal/Alertness: Awake/alert Behavior During Therapy: WFL for tasks assessed/performed Overall Cognitive Status: Impaired/Different from baseline Area of Impairment: Awareness;Problem solving;Safety/judgement;Following commands;Memory;Attention                 Orientation Level: Situation;Time;Place Current Attention Level: Sustained Memory: Decreased short-term memory Following Commands: Follows one step commands consistently;Follows one step commands with increased time Safety/Judgement: Decreased awareness of safety;Decreased awareness of deficits Awareness: Emergent Problem Solving: Slow processing;Requires verbal cues General Comments: pt following simple commands, requires cueing for problem sovling and awareness.  cueing for safety Functional Status Assessment: Patient has had a recent decline in their functional status and demonstrates the ability to make significant improvements in function in a reasonable and predictable amount of time.      Exercises      General Comments General comments (skin integrity, edema, etc.): vss on RA      Pertinent Vitals/Pain Pain Assessment: No/denies pain    Home Living                          Prior Function  PT Goals (current goals can now be found in the care plan section) Acute Rehab PT Goals PT Goal Formulation: With patient/family Time For Goal Achievement: 08/09/21 Potential to Achieve Goals: Good Progress towards PT goals: Progressing toward goals    Frequency    Min 3X/week      PT Plan Current plan remains appropriate     Co-evaluation              AM-PAC PT "6 Clicks" Mobility   Outcome Measure  Help needed turning from your back to your side while in a flat bed without using bedrails?: A Little Help needed moving from lying on your back to sitting on the side of a flat bed without using bedrails?: A Little Help needed moving to and from a bed to a chair (including a wheelchair)?: A Little Help needed standing up from a chair using your arms (e.g., wheelchair or bedside chair)?: A Little Help needed to walk in hospital room?: A Little Help needed climbing 3-5 steps with a railing? : A Lot 6 Click Score: 17    End of Session   Activity Tolerance: Patient tolerated treatment well Patient left: in bed;with call bell/phone within reach;with nursing/sitter in room Nurse Communication: Mobility status PT Visit Diagnosis: Other abnormalities of gait and mobility (R26.89);Difficulty in walking, not elsewhere classified (R26.2);Muscle weakness (generalized) (M62.81)     Time: 1735-6701 PT Time Calculation (min) (ACUTE ONLY): 26 min  Charges:  $Gait Training: 23-37 mins                     07/27/2021  Ginger Carne., PT Acute Rehabilitation Services [phone]  (pager) [phone]  (office)   Terry Kemp 07/27/2021, 5:14 PM

## 2021-07-28 DIAGNOSIS — I10 Essential (primary) hypertension: Secondary | ICD-10-CM | POA: Diagnosis not present

## 2021-07-28 DIAGNOSIS — T1491XA Suicide attempt, initial encounter: Secondary | ICD-10-CM | POA: Diagnosis not present

## 2021-07-28 DIAGNOSIS — G9340 Encephalopathy, unspecified: Secondary | ICD-10-CM | POA: Diagnosis not present

## 2021-07-28 DIAGNOSIS — J9621 Acute and chronic respiratory failure with hypoxia: Secondary | ICD-10-CM | POA: Diagnosis not present

## 2021-07-28 NOTE — Progress Notes (Signed)
[facility]  PROGRESS NOTE  Terry Kemp FIE:[id number] DOB: 28-Oct-1945 DOA: 07/24/2021 PCP: Lawerance Cruel, MD   Brief HPI:   75 year old male with history of CAD s/p PCI greater than 5 years, hypertension, history of CVA with residual right-sided weakness and anxiety presented with intentional Xanax overdose.  Per H&P: "Per pt son the pt communicated to him at 1038 that desire not to live and that he had just taken several xanax. Son arrived to father at 2, where he was very drowsy but able to be aroused when he shook him at first, later being less responsive. EMS was dispatched, arriving at 1110 and found patient unresponsive and possibly pulseless (there are varying reports about if patient received brief CPR). Nasal trumped placed by EMS and given 1x narcan without improvement. BVM to ED.    In ED, unresponsive with snoring respirations and gurgling. Decision made to intubate."     11/20 Intubated and admitted 11/21 Extubated  11/22 Transferred to [facility]; psychiatry started Zyprexa, recommended BZD taper and recommended inpatient psychiatry  Subjective   Patient seen and examined, no new complaints.  Awaiting bed at inpatient psych facility   Assessment/Plan:     Intentional benzodiazepine overdose -Patient was intubated, extubated on 07/25/2021 -NG tube removed -Medically stable for discharge -Psych recommended stopping Xanax; started on clonazepam taper over the next 2 weeks -Continue Zyprexa per psychiatry recommendations -Patient is to go to inpatient psych facility -If he resists going to facility, psych recommends to make him IVC  Acute encephalopathy -Resolved -Secondary to benzodiazepine overdose  Suicide attempt -Psych consulted, recommended inpatient psych hospitalization -Awaiting bed at inpatient psych facility  Acute on chronic hypoxic respiratory failure -Patient became obtunded and hypoxic due to Xanax overdose -Required  intubation -Respiratory failure is resolved -Weaned off oxygen  Hyponatremia -Mild, improving  Hypertension -Blood pressure mildly elevated -Home medications have been resumed -We will closely monitor patient's blood pressure  CAD s/p RCA DES 2006 -Continue aspirin, amlodipine metoprolol  Elevated acetaminophen level -Tylenol level was 36 on admission; mildly elevated -Not treated -No signs of liver injury       Medications     amLODipine  10 mg Oral Daily   aspirin EC  81 mg Oral Daily   atorvastatin  40 mg Oral Daily   Chlorhexidine Gluconate Cloth  6 each Topical Daily   clonazePAM  0.5 mg Oral BID   Followed by   Derrill Memo ON 07/30/2021] clonazePAM  0.5 mg Oral QHS   heparin  5,000 Units Subcutaneous Q8H   metoprolol succinate  25 mg Oral Daily   ramipril  10 mg Oral Daily     Data Reviewed:   CBG:  Recent Labs  Lab 07/25/21 1939 07/25/21 2348 07/26/21 0344 07/26/21 0703 07/26/21 1055  GLUCAP 143* 115* 115* 101* 121*    SpO2: 96 % O2 Flow Rate (L/min): 2 L/min FiO2 (%): 30 %    Vitals:   07/28/21 0405 07/28/21 0700 07/28/21 1120 07/28/21 1502  BP: (!) 153/83 130/66 (!) 137/38 127/70  Pulse: 63 66 60 66  Resp: 18 18 18 18   Temp: 97.8 F (36.6 C) 98.3 F (36.8 C) 97.9 F (36.6 C) 98 F (36.7 C)  TempSrc: Oral Oral Oral Oral  SpO2: 97% 95% 100% 96%  Weight:      Height:         Intake/Output Summary (Last 24 hours) at 07/28/2021 1611 Last data filed at 07/28/2021 0700 Gross per 24 hour  Intake 100  ml  Output 1100 ml  Net -1000 ml    11/22 1901 - 11/24 0700 In: 340 [P.O.:340] Out: 2600 [Urine:2600]  Filed Weights   07/25/21 0405 07/26/21 0431  Weight: 66.8 kg 68.2 kg    Data Reviewed: Basic Metabolic Panel: Recent Labs  Lab 07/24/21 1244 07/24/21 1517 07/25/21 0444 07/26/21 0148 07/27/21 0141  NA 130* 130* 126* 134* 134*  K 4.1 4.5 3.5 3.4* 3.5  CL 99  --  98 105 104  CO2 25  --  20* 21* 22  GLUCOSE 97  --  137*  122* 96  BUN 8  --  10 <5* 6*  CREATININE 0.75  --  0.79 0.66 0.72  CALCIUM 7.9*  --  8.2* 8.3* 8.6*  MG  --   --  1.7 2.1 1.9   Liver Function Tests: Recent Labs  Lab 07/24/21 1244 07/26/21 0148  AST 18 15  ALT 19 16  ALKPHOS 60 50  BILITOT 0.7 0.9  PROT 5.9* 5.7*  ALBUMIN 3.4* 3.2*   Recent Labs  Lab 07/24/21 1244  LIPASE 32   No results for input(s): AMMONIA in the last 168 hours. CBC: Recent Labs  Lab 07/24/21 1517 07/25/21 0444 07/26/21 0148  WBC  --  8.4 9.0  HGB 12.9* 14.2 13.6  HCT 38.0* 39.7 38.1*  MCV  --  85.6 87.8  PLT  --  195 183   Cardiac Enzymes: No results for input(s): CKTOTAL, CKMB, CKMBINDEX, TROPONINI in the last 168 hours. BNP (last 3 results) No results for input(s): BNP in the last 8760 hours.  ProBNP (last 3 results) No results for input(s): PROBNP in the last 8760 hours.  CBG: Recent Labs  Lab 07/25/21 1939 07/25/21 2348 07/26/21 0344 07/26/21 0703 07/26/21 1055  GLUCAP 143* 115* 115* 101* 121*       Radiology Reports  DG Abd 1 View  Result Date: 07/25/2021 CLINICAL DATA:  NG tube placement EXAM: ABDOMEN - 1 VIEW COMPARISON:  Chest radiograph 07/25/2021. FINDINGS: Single view of the lower chest and upper abdomen demonstrates a gastric tube with tip and side port below the diaphragm, overlying the stomach. The imaged bowel gas pattern is normal. Supine technique limits evaluation for intraperitoneal free air. The imaged lungs are clear. IMPRESSION: 1. Gastric tube tip and side port below the diaphragm, overlying the stomach. 2. No acute abnormality in the imaged chest or upper abdomen. Electronically Signed   By: Merilyn Baba M.D.   On: 07/25/2021 20:26     DVT prophylaxis: Heparin  Code Status: Full code  Family Communication: No family at bedside   Consultants: Psychiatry  Procedures:     Objective    Physical Examination:  General-appears in no acute distress Heart-S1-S2, regular, no murmur  auscultated Lungs-clear to auscultation bilaterally, no wheezing or crackles auscultated Abdomen-soft, nontender, no organomegaly Extremities-no edema in the lower extremities Neuro-alert, oriented x3, no focal deficit noted  Status is: Inpatient  Dispo: The patient is from: Home              Anticipated d/c is to: Inpatient psych facility              Anticipated d/c date is: 07/31/2021              Patient currently not stable for discharge  Barrier to discharge-awaiting transfer to inpatient psych facility  COVID-19 Labs  No results for input(s): DDIMER, FERRITIN, LDH, CRP in the last 72 hours.  Lab Results  Component  Value Date   SARSCOV2NAA NEGATIVE 07/24/2021   SARSCOV2NAA POSITIVE (A) 06/26/2019            Recent Results (from the past 240 hour(s))  Resp Panel by RT-PCR (Flu A&B, Covid) Nasopharyngeal Swab     Status: None   Collection Time: 07/24/21 12:45 PM   Specimen: Nasopharyngeal Swab; Nasopharyngeal(NP) swabs in vial transport medium  Result Value Ref Range Status   SARS Coronavirus 2 by RT PCR NEGATIVE NEGATIVE Final    Comment: (NOTE) SARS-CoV-2 target nucleic acids are NOT DETECTED.  The SARS-CoV-2 RNA is generally detectable in upper respiratory specimens during the acute phase of infection. The lowest concentration of SARS-CoV-2 viral copies this assay can detect is 138 copies/mL. A negative result does not preclude SARS-Cov-2 infection and should not be used as the sole basis for treatment or other patient management decisions. A negative result may occur with  improper specimen collection/handling, submission of specimen other than nasopharyngeal swab, presence of viral mutation(s) within the areas targeted by this assay, and inadequate number of viral copies(<138 copies/mL). A negative result must be combined with clinical observations, patient history, and epidemiological information. The expected result is Negative.  Fact Sheet for  Patients:  EntrepreneurPulse.com.au  Fact Sheet for Healthcare Providers:  IncredibleEmployment.be  This test is no t yet approved or cleared by the [place] FDA and  has been authorized for detection and/or diagnosis of SARS-CoV-2 by FDA under an Emergency Use Authorization (EUA). This EUA will remain  in effect (meaning this test can be used) for the duration of the COVID-19 declaration under Section 564(b)(1) of the Act, 21 U.S.C.section 360bbb-3(b)(1), unless the authorization is terminated  or revoked sooner.       Influenza A by PCR NEGATIVE NEGATIVE Final   Influenza B by PCR NEGATIVE NEGATIVE Final    Comment: (NOTE) The Xpert Xpress SARS-CoV-2/FLU/RSV plus assay is intended as an aid in the diagnosis of influenza from Nasopharyngeal swab specimens and should not be used as a sole basis for treatment. Nasal washings and aspirates are unacceptable for Xpert Xpress SARS-CoV-2/FLU/RSV testing.  Fact Sheet for Patients: EntrepreneurPulse.com.au  Fact Sheet for Healthcare Providers: IncredibleEmployment.be  This test is not yet approved or cleared by the [place] FDA and has been authorized for detection and/or diagnosis of SARS-CoV-2 by FDA under an Emergency Use Authorization (EUA). This EUA will remain in effect (meaning this test can be used) for the duration of the COVID-19 declaration under Section 564(b)(1) of the Act, 21 U.S.C. section 360bbb-3(b)(1), unless the authorization is terminated or revoked.  Performed at [facility], [place] [address] [place], [place] [address]     [facility] If 7PM-7AM, please contact night-coverage at [email or web address], Office  [phone]   07/28/2021, 4:11 PM  LOS: 4 days

## 2021-07-28 NOTE — Consult Note (Signed)
Terry Kemp   Reason for Kemp:  Intentional Benzo overdose Referring Physician:   Myrene Buddy, MD Patient Identification: Terry Kemp MRN:  [id number] Principal Diagnosis: Intentional benzodiazepine overdose [facility]) Diagnosis:  Principal Problem:   Intentional benzodiazepine overdose ([name]) Active Problems:   CAD S/P RCA DES 2006   Essential hypertension   Suicide attempt ([place])   Acute on chronic respiratory failure with hypoxia ([place])   Hyponatremia   Hypokalemia   Acute encephalopathy  Assessment  Terry Kemp is a 75 y.o. male admitted medically for Intentional benzodiazepine overdose on 07/24/2021 12:33 PM .Patient carries the psychiatric diagnoses of MDD w/ previous SA via OD and anxiety and has a past medical history of PAD, HTN , and MI.Psychiatry was consulted for suicide attempt.   He meets criteria for MDD based on present assessment and EMR.  Outpatient psychotropic medications include Xanax for anxiety and patient reports he has ben taking this patient nightly for sleep, and his (ex)wife (lives with patient) endorses that he keeps them in his pocket for anxiety throughout the day. Historically he has also had Zoloft, Wellbutrin and TMS had a poor response to these medications and treatments. On initial examination, patient appears to endorse continued symptoms of depression as well as medication overuse regarding his Xanax. We plan to recommend that patient have inpatient psychiatry hospitalization, Benzo taper, and consider ECT treatment for depression .    Patient seemed more aware of his surroundings. Patient continues to appear dysphoric although he his judgment has slowly improved and patient is more willing to go to inpatient psych for care for his depression. Patient insight overall still seems poor, as he does not appear to grasp the connection between his chronic depression and sudden SA. Patient really requires inpatient psych  hospitalization for appropriate treatment due to his hx of sensitivity and poor response to medication and overall tx refractory depression that has resulted in 2 SAs. He continues to do well on klonopin taper. Patient has been seen by PT and appears to be doing well and should be able to ambulate well on a geriatric psych unit.   MDD, recurrent, severe w/o psychotic features Benzodiazapine use disorder - Continue benzo taper: Klonopin 0.5mg  BID for 48 hrs, Klonopin 0.5 QHS for 1 week - Zyprexa 5mg  IM or PO QHS PRN for agitation   Delirium recs 1. Avoid benzodiazepines, antihistamines, anticholinergics, and minimize opiate use as these may worsen delirium. 2: Assess, prevent and manage pain as lack of treatment can result in delirium.  3: Provide appropriate lighting and clear signage; a clock and calendar should be easily visible to the patient. 4: Monitor environmental factors. Reduce light and noise at night (close shades, turn off lights, turn off TV, ect). Correct any alterations in sleep cycle. 5: Reorient the patient to person, place, time and situation on each encounter.  6: Correct sensory deficits if possible (replace eye glasses, hearing aids, ect). 7: Avoid restraints if able. Severely delirious patients benefit from constant observation by a sitter.    Safety Recommend that patient be continues on 1:1 observation.    Legal Patient is his on Legal guardian and his son is his NOK. Patient continues to think about inpatient and is slowly adjusting to the idea; however if placement is found and patient is unwilling to go would recommend IVC.   Dispo Recommend inpatient psychiatry hospitalization when medically stable. Total Time spent with patient: 15 minutes  Subjective:   Terry Kemp is a  75 y.o. male patient admitted with Intentional Benzo overdose.  HPI:  Patient reports that he is "bored" today. Patient reports that he slept "suprisingly well" last night until he was  moved out of the ICU in the early AM. Patient reports he has been talking with his new sitter more, and endorses that he is more willing to go to inpatient psych hospital voluntarily. Patient continues to deny SI, HI, and AVH.   Patient and provider discuss patient's Xanax use prior to hospitalization. Patient reports he was only taking 1 Xanax a day at bedtime and has been doing so for the past  2 years. Patient endorsed that he liked that it worked for his sleep.   Past Medical History:  Past Medical History:  Diagnosis Date  . Anxiety   . Arthritis   . CAD S/P percutaneous coronary angioplasty 01/2005   ST elevation MI -CATH/PCI-RCA (Cypher DES 3 x 28 - 3.10mm); CATH 2008 FOR INFERIOR ISCHEMIA - 80% dRCA ISR reduced to 40% with NTG - spasm.  . Depression   . Hyperlipidemia   . Hypertension   . Myocardial infarction ([place])    2 strokes  . PAD (peripheral artery disease) ([place]) 2000   bilateral iliac arteries PTA  and  stenting in 04/30/1998; In 2006-subsequent  R Common Iliac (RCIA) 100% ISR, 75 % LCIA -- staged Bifuracation Kissing Stent creating new Aortic Carina.-- Overlapping 7 mm x 82mm & 72mm stents RCIA & kissing 7 mm x 29 mm in LCIA.  . Stroke [facility]) 2013; 08/2015   a) Felt to be a TIA;; b) Imaging confirmed CVA - mild residual R-sided weakness    Past Surgical History:  Procedure Laterality Date  . ABDOMINAL AOTROGRAM  07/17/2007   ADDTION WITH CARDIAC CATH---(INFRARENAL WITH BIFEMORAL RUNOFF )----WIDELY PATENT STENTS TO LEFT AND RIGHT COMMON ILIACS  . BACK SURGERY     metal plate  . CARDIAC CATHETERIZATION  01/10/2005   Inf STEMI: dLM 50% - calcified. mRCA 100% (infaract-related vessel) --> DES PCI; AbdAo-Iliac Angio: PAD R Common Iliac (RCIA) 100% ISR, LCIA 75% ISR @ outflow tract of the left iliac. LVEF~ 60%  . CARDIAC CATHETERIZATION  07/17/2007   40% dLM (previously read as 50%); 80% mRCA - distal stent ISR --> reduced to 40% with NTG = due to spasm--widely patent bilaeral  common illiacs  . CORONARY ANGIOPLASTY WITH STENT PLACEMENT  01/10/2005   mRCA DES PCI - CYPHER DES 3.0 x 28 mm -> post-dilated to 3.25 mm  . EYE SURGERY Bilateral 2000   cataracts  . lower extremities arterial doppler Bilateral 01/26/2005,09/28/2004   01/26/05-- right ABI -MODERATE,LEFT ABI MILLD -RIGHT CIA STENT OCCLUDED ,RIGHT IIA REVERSIBLE FLOW;LEFT CIA >70%--09/28/2004- RGT ABI'S 76, LFT ABI'S102  . NM MYOCAR PERF EJECTION FRACTION  06/03/2007   REST/STRESS -- EF 69% MILD INFEROAPICAL ISCHEMIA NOTED - Referred for CATH - RCA PCI  . NM MYOVIEW LTD  11/2014   Low Risk - "Diaphragmatic attenuation", no infarct or ischemia  . peripheral abd aortic cath  04/04/2005   performed by Dr Rollene Fare---  LCIA 75% ISR, 100% RCIA ISR: kissing balloon PTA  --> Kissing STENTs Bilateral CIA (RCIA 2 overlapping 7.0 x 39 mm & 7.0 x 18 mm Gensis stents, LCIA - 41mm x 29 mm Gensis stent (noted to be patent in 2008 Cath)  . peripheral arteries disease  09/1999   status post bilatera iliac stents jan 2001. three stents in the left common iliasc ,1 widely patent stent right common  iliac, 2 overlapping stents revealed nio more 10% in-stent restenosis in 2008.   Marland Kitchen SPINAL FUSION  11/2014   SURGERY BY DR Ellene Route  . TRANSTHORACIC ECHOCARDIOGRAM  09/2015   Normal LV size and systolic function, EF 34-28%. Normal RV size.  No valvular lesions.   Family History:  Family History  Problem Relation Age of Onset  . Cancer Mother   . Heart disease Maternal Grandfather     Social History:  Social History   Substance and Sexual Activity  Alcohol Use Yes  . Alcohol/week: 14.0 standard drinks  . Types: 14 Glasses of wine per week     Social History   Substance and Sexual Activity  Drug Use No    Social History   Socioeconomic History  . Marital status: Married    Spouse name: Hisako  . Number of children: 2  . Years of education: college  . Highest education level: Not on file  Occupational History  . Not on  file  Tobacco Use  . Smoking status: Former    Packs/day: 1.00    Years: 38.00    Pack years: 38.00    Types: Cigarettes    Quit date: 01/01/2017    Years since quitting: 4.5  . Smokeless tobacco: Never  Vaping Use  . Vaping Use: Never used  Substance and Sexual Activity  . Alcohol use: Yes    Alcohol/week: 14.0 standard drinks    Types: 14 Glasses of wine per week  . Drug use: No  . Sexual activity: Not on file  Other Topics Concern  . Not on file  Social History Narrative   Lives with wife   Right handed   Drinks 2-3 cups caffeine daily   Social Determinants of Health   Financial Resource Strain: Not on file  Food Insecurity: Not on file  Transportation Needs: Not on file  Physical Activity: Not on file  Stress: Not on file  Social Connections: Not on file   Additional Social History:    Allergies:   Allergies  Allergen Reactions  . Zoloft [Sertraline Hcl] Other (See Comments)    Psychosis per patient.    Labs:  Results for orders placed or performed during the hospital encounter of 07/24/21 (from the past 48 hour(s))  Basic metabolic panel     Status: Abnormal   Collection Time: 07/27/21  1:41 AM  Result Value Ref Range   Sodium 134 (L) 135 - 145 mmol/L   Potassium 3.5 3.5 - 5.1 mmol/L   Chloride 104 98 - 111 mmol/L   CO2 22 22 - 32 mmol/L   Glucose, Bld 96 70 - 99 mg/dL    Comment: Glucose reference range applies only to samples taken after fasting for at least 8 hours.   BUN 6 (L) 8 - 23 mg/dL   Creatinine, Ser 0.72 0.61 - 1.24 mg/dL   Calcium 8.6 (L) 8.9 - 10.3 mg/dL   GFR, Estimated >60 >60 mL/min    Comment: (NOTE) Calculated using the CKD-EPI Creatinine Equation (2021)    Anion gap 8 5 - 15    Comment: Performed at [facility] [address] [place], [place] [address]  Magnesium     Status: None   Collection Time: 07/27/21  1:41 AM  Result Value Ref Range   Magnesium 1.9 1.7 - 2.4 mg/dL    Comment: Performed at [facility] [address], [place], [place] [address]    Current Facility-Administered Medications  Medication  Dose Route Frequency Provider Last Rate Last Admin  . amLODipine (NORVASC) tablet 10 mg  10 mg Oral Daily Edwin Dada, MD   10 mg at 07/28/21 0931  . aspirin EC tablet 81 mg  81 mg Oral Daily Edwin Dada, MD   81 mg at 07/28/21 0931  . atorvastatin (LIPITOR) tablet 40 mg  40 mg Oral Daily Edwin Dada, MD   40 mg at 07/28/21 0931  . Chlorhexidine Gluconate Cloth 2 % PADS 6 each  6 each Topical Daily Marshell Garfinkel, MD   6 each at 07/28/21 0940  . clonazePAM (KLONOPIN) tablet 0.5 mg  0.5 mg Oral BID Oswald Hillock, MD   0.5 mg at 07/28/21 0931   Followed by  . [START ON 07/30/2021] clonazePAM (KLONOPIN) tablet 0.5 mg  0.5 mg Oral QHS [place], Marge Duncans, MD      . docusate sodium (COLACE) capsule 100 mg  100 mg Oral BID PRN Bowser, Laurel Dimmer, NP      . heparin injection 5,000 Units  5,000 Units Subcutaneous Q8H Bowser, Laurel Dimmer, NP   5,000 Units at 07/28/21 0548  . hydrALAZINE (APRESOLINE) injection 10-40 mg  10-40 mg Intravenous Q4H PRN Anders Simmonds, MD   20 mg at 07/27/21 0410  . metoprolol succinate (TOPROL-XL) 24 hr tablet 25 mg  25 mg Oral Daily Edwin Dada, MD   25 mg at 07/28/21 0931  . OLANZapine (ZYPREXA) injection 5 mg  5 mg Intramuscular QHS PRN,MR X 1 Tifanie [place]iner, Mila Palmer, MD       Or  . OLANZapine zydis (ZYPREXA) disintegrating tablet 5 mg  5 mg Oral QHS PRN,MR X 1 Vanda Waskey B, MD      . ondansetron (ZOFRAN) injection 4 mg  4 mg Intravenous Q6H PRN Bowser, Laurel Dimmer, NP      . polyethylene glycol (MIRALAX / GLYCOLAX) packet 17 g  17 g Per Tube Daily PRN Bowser, Laurel Dimmer, NP      . ramipril (ALTACE) capsule 10 mg  10 mg Oral Daily Danford, Suann Larry, MD   10 mg at 07/28/21 0930     Psychiatric Specialty Exam:  Presentation  General Appearance: Appropriate for Environment  Eye Contact:Fair  Speech:Clear and Coherent  Speech  Volume:Normal  Handedness:No data recorded  Mood and Affect  Mood:Dysphoric  Affect:Congruent   Thought Process  Thought Processes:Goal Directed  Descriptions of Associations:Intact  Orientation:Full (Time, Place and Person)  Thought Content:Logical  History of Schizophrenia/Schizoaffective disorder:No data recorded Duration of Psychotic Symptoms:No data recorded Hallucinations:Hallucinations: None  Ideas of Reference:None  Suicidal Thoughts:Suicidal Thoughts: No  Homicidal Thoughts:Homicidal Thoughts: No   Sensorium  Memory:Immediate Good; Recent Good; Remote Good  Judgment:-- (Improving)  Insight:Shallow   Executive Functions  Concentration:Fair  Attention Span:Fair  [place]  Language:Good   Psychomotor Activity  Psychomotor Activity:Psychomotor Activity: Decreased   Assets  Assets:Communication Skills; Housing; Social Support; Resilience   Sleep  Sleep:Sleep: Good   Physical Exam: Physical Exam Constitutional:      Appearance: Normal appearance.  HENT:     Head: Normocephalic and atraumatic.  Pulmonary:     Effort: Pulmonary effort is normal.  Neurological:     Mental Status: He is alert and oriented to person, place, and time.   Review of Systems  Psychiatric/Behavioral:  Negative for hallucinations and suicidal ideas.   Blood pressure (!) 137/38, pulse 60, temperature 97.9 F (36.6 C), temperature source Oral, resp. rate 18, height  5\' 6"  (1.676 m), weight 68.2 kg, SpO2 100 %. Body mass index is 24.27 kg/m.   Pgy-2 Freida Busman, MD 07/28/2021 12:11 PM

## 2021-07-29 DIAGNOSIS — T1491XA Suicide attempt, initial encounter: Secondary | ICD-10-CM | POA: Diagnosis not present

## 2021-07-29 DIAGNOSIS — G9340 Encephalopathy, unspecified: Secondary | ICD-10-CM | POA: Diagnosis not present

## 2021-07-29 DIAGNOSIS — J9621 Acute and chronic respiratory failure with hypoxia: Secondary | ICD-10-CM | POA: Diagnosis not present

## 2021-07-29 DIAGNOSIS — I10 Essential (primary) hypertension: Secondary | ICD-10-CM | POA: Diagnosis not present

## 2021-07-29 MED ORDER — ALPRAZOLAM 0.5 MG PO TABS
0.5000 mg | ORAL_TABLET | Freq: Once | ORAL | Status: AC
  Administered 2021-07-29: 0.5 mg via ORAL
  Filled 2021-07-29: qty 1

## 2021-07-29 NOTE — Progress Notes (Signed)
[facility]  PROGRESS NOTE  Terry Kemp QVZ:[id number] DOB: 1946/02/10 DOA: 07/24/2021 PCP: Lawerance Cruel, MD   Brief HPI:   75 year old male with history of CAD s/p PCI greater than 5 years, hypertension, history of CVA with residual right-sided weakness and anxiety presented with intentional Xanax overdose.  Per H&P: "Per pt son the pt communicated to him at 1038 that desire not to live and that he had just taken several xanax. Son arrived to father at 76, where he was very drowsy but able to be aroused when he shook him at first, later being less responsive. EMS was dispatched, arriving at 1110 and found patient unresponsive and possibly pulseless (there are varying reports about if patient received brief CPR). Nasal trumped placed by EMS and given 1x narcan without improvement. BVM to ED.    In ED, unresponsive with snoring respirations and gurgling. Decision made to intubate."     11/20 Intubated and admitted 11/21 Extubated  11/22 Transferred to [facility]; psychiatry started Zyprexa, recommended BZD taper and recommended inpatient psychiatry  Subjective   Patient seen and examined, feeling better this morning.  Still waiting for inpatient psych facility.   Assessment/Plan:     Intentional benzodiazepine overdose -Patient was intubated, extubated on 07/25/2021 -NG tube removed -Medically stable for discharge -Psych recommended stopping Xanax; started on clonazepam taper over the next 2 weeks -Continue Zyprexa per psychiatry recommendations -Patient is to go to inpatient psych facility -If he resists going to facility, psych recommends to make him IVC  Acute encephalopathy -Resolved -Secondary to benzodiazepine overdose  Suicide attempt -Psych consulted, recommended inpatient psych hospitalization -Awaiting bed at inpatient psych facility  Acute on chronic hypoxic respiratory failure -Patient became obtunded and hypoxic due to Xanax overdose -Required  intubation -Respiratory failure is resolved -Weaned off oxygen  Hyponatremia -Mild, improving  Hypertension -Blood pressure mildly elevated -Home medications have been resumed -Blood pressure is stable  CAD s/p RCA DES 2006 -Continue aspirin, amlodipine metoprolol  Elevated acetaminophen level -Tylenol level was 36 on admission; mildly elevated -Not treated -No signs of liver injury       Medications     amLODipine  10 mg Oral Daily   aspirin EC  81 mg Oral Daily   atorvastatin  40 mg Oral Daily   clonazePAM  0.5 mg Oral BID   Followed by   Derrill Memo ON 07/30/2021] clonazePAM  0.5 mg Oral QHS   heparin  5,000 Units Subcutaneous Q8H   metoprolol succinate  25 mg Oral Daily   ramipril  10 mg Oral Daily     Data Reviewed:   CBG:  Recent Labs  Lab 07/25/21 1939 07/25/21 2348 07/26/21 0344 07/26/21 0703 07/26/21 1055  GLUCAP 143* 115* 115* 101* 121*    SpO2: 99 % O2 Flow Rate (L/min): 2 L/min FiO2 (%): 30 %    Vitals:   07/28/21 1916 07/28/21 2301 07/29/21 0311 07/29/21 0718  BP: (!) 145/73 (!) 168/81 (!) 125/57 (!) 141/79  Pulse: 65 (!) 59 62 63  Resp: 19  18 19   Temp: 97.9 F (36.6 C) 97.6 F (36.4 C) 97.9 F (36.6 C) 97.9 F (36.6 C)  TempSrc: Oral Oral Oral Oral  SpO2: 98% 97% 94% 99%  Weight:      Height:         Intake/Output Summary (Last 24 hours) at 07/29/2021 0946 Last data filed at 07/29/2021 0905 Gross per 24 hour  Intake 820 ml  Output --  Net 820 ml  11/23 1901 - 11/25 0700 In: 800 [P.O.:800] Out: 1100 [Urine:1100]  Filed Weights   07/25/21 0405 07/26/21 0431  Weight: 66.8 kg 68.2 kg    Data Reviewed: Basic Metabolic Panel: Recent Labs  Lab 07/24/21 1244 07/24/21 1517 07/25/21 0444 07/26/21 0148 07/27/21 0141  NA 130* 130* 126* 134* 134*  K 4.1 4.5 3.5 3.4* 3.5  CL 99  --  98 105 104  CO2 25  --  20* 21* 22  GLUCOSE 97  --  137* 122* 96  BUN 8  --  10 <5* 6*  CREATININE 0.75  --  0.79 0.66 0.72   CALCIUM 7.9*  --  8.2* 8.3* 8.6*  MG  --   --  1.7 2.1 1.9   Liver Function Tests: Recent Labs  Lab 07/24/21 1244 07/26/21 0148  AST 18 15  ALT 19 16  ALKPHOS 60 50  BILITOT 0.7 0.9  PROT 5.9* 5.7*  ALBUMIN 3.4* 3.2*   Recent Labs  Lab 07/24/21 1244  LIPASE 32   No results for input(s): AMMONIA in the last 168 hours. CBC: Recent Labs  Lab 07/24/21 1517 07/25/21 0444 07/26/21 0148  WBC  --  8.4 9.0  HGB 12.9* 14.2 13.6  HCT 38.0* 39.7 38.1*  MCV  --  85.6 87.8  PLT  --  195 183   Cardiac Enzymes: No results for input(s): CKTOTAL, CKMB, CKMBINDEX, TROPONINI in the last 168 hours. BNP (last 3 results) No results for input(s): BNP in the last 8760 hours.  ProBNP (last 3 results) No results for input(s): PROBNP in the last 8760 hours.  CBG: Recent Labs  Lab 07/25/21 1939 07/25/21 2348 07/26/21 0344 07/26/21 0703 07/26/21 1055  GLUCAP 143* 115* 115* 101* 121*       Radiology Reports  DG Abd 1 View  Result Date: 07/25/2021 CLINICAL DATA:  NG tube placement EXAM: ABDOMEN - 1 VIEW COMPARISON:  Chest radiograph 07/25/2021. FINDINGS: Single view of the lower chest and upper abdomen demonstrates a gastric tube with tip and side port below the diaphragm, overlying the stomach. The imaged bowel gas pattern is normal. Supine technique limits evaluation for intraperitoneal free air. The imaged lungs are clear. IMPRESSION: 1. Gastric tube tip and side port below the diaphragm, overlying the stomach. 2. No acute abnormality in the imaged chest or upper abdomen. Electronically Signed   By: Merilyn Baba M.D.   On: 07/25/2021 20:26     DVT prophylaxis: Heparin  Code Status: Full code  Family Communication: No family at bedside   Consultants: Psychiatry  Procedures:     Objective    Physical Examination:  General-appears in no acute distress Heart-S1-S2, regular, no murmur auscultated Lungs-clear to auscultation bilaterally, no wheezing or crackles  auscultated Abdomen-soft, nontender, no organomegaly Extremities-no edema in the lower extremities Neuro-alert, oriented x3, no focal deficit noted  Status is: Inpatient  Dispo: The patient is from: Home              Anticipated d/c is to: Inpatient psych facility              Anticipated d/c date is: 07/31/2021              Patient currently not stable for discharge  Barrier to discharge-awaiting transfer to inpatient psych facility  COVID-19 Labs  No results for input(s): DDIMER, FERRITIN, LDH, CRP in the last 72 hours.  Lab Results  Component Value Date   SARSCOV2NAA NEGATIVE 07/24/2021   SARSCOV2NAA POSITIVE (A)  06/26/2019            Recent Results (from the past 240 hour(s))  Resp Panel by RT-PCR (Flu A&B, Covid) Nasopharyngeal Swab     Status: None   Collection Time: 07/24/21 12:45 PM   Specimen: Nasopharyngeal Swab; Nasopharyngeal(NP) swabs in vial transport medium  Result Value Ref Range Status   SARS Coronavirus 2 by RT PCR NEGATIVE NEGATIVE Final    Comment: (NOTE) SARS-CoV-2 target nucleic acids are NOT DETECTED.  The SARS-CoV-2 RNA is generally detectable in upper respiratory specimens during the acute phase of infection. The lowest concentration of SARS-CoV-2 viral copies this assay can detect is 138 copies/mL. A negative result does not preclude SARS-Cov-2 infection and should not be used as the sole basis for treatment or other patient management decisions. A negative result may occur with  improper specimen collection/handling, submission of specimen other than nasopharyngeal swab, presence of viral mutation(s) within the areas targeted by this assay, and inadequate number of viral copies(<138 copies/mL). A negative result must be combined with clinical observations, patient history, and epidemiological information. The expected result is Negative.  Fact Sheet for Patients:  EntrepreneurPulse.com.au  Fact Sheet for Healthcare  Providers:  IncredibleEmployment.be  This test is no t yet approved or cleared by the [place] FDA and  has been authorized for detection and/or diagnosis of SARS-CoV-2 by FDA under an Emergency Use Authorization (EUA). This EUA will remain  in effect (meaning this test can be used) for the duration of the COVID-19 declaration under Section 564(b)(1) of the Act, 21 U.S.C.section 360bbb-3(b)(1), unless the authorization is terminated  or revoked sooner.       Influenza A by PCR NEGATIVE NEGATIVE Final   Influenza B by PCR NEGATIVE NEGATIVE Final    Comment: (NOTE) The Xpert Xpress SARS-CoV-2/FLU/RSV plus assay is intended as an aid in the diagnosis of influenza from Nasopharyngeal swab specimens and should not be used as a sole basis for treatment. Nasal washings and aspirates are unacceptable for Xpert Xpress SARS-CoV-2/FLU/RSV testing.  Fact Sheet for Patients: EntrepreneurPulse.com.au  Fact Sheet for Healthcare Providers: IncredibleEmployment.be  This test is not yet approved or cleared by the [place] FDA and has been authorized for detection and/or diagnosis of SARS-CoV-2 by FDA under an Emergency Use Authorization (EUA). This EUA will remain in effect (meaning this test can be used) for the duration of the COVID-19 declaration under Section 564(b)(1) of the Act, 21 U.S.C. section 360bbb-3(b)(1), unless the authorization is terminated or revoked.  Performed at [facility], [place] [address], [place], [place] [address]     [facility] If 7PM-7AM, please contact night-coverage at [email or web address], Office  [phone]   07/29/2021, 9:46 AM  LOS: 5 days

## 2021-07-29 NOTE — Progress Notes (Addendum)
Patient was accepted to [facility] pending receipt of IVC paperwork. IVC paperowkr to be sent to [phone], Attention Admission.  Meets inpatient criteria per Dr. Candie Chroman.  Attending physician is Dr. Yehuda Budd.  Patient is to go to the geriatric unit.  Notified Ria Bush, RN of acceptance.  Nurses call report to [phone].   Patient can arrive 07/30/2021 after Hollie Beach, MSW, LCSW 07/29/2021 2:24 PM

## 2021-07-29 NOTE — Progress Notes (Signed)
Speech Language Pathology Treatment: Dysphagia  Patient Details Name: Terry Kemp MRN: [id number] DOB: 1946/07/22 Today's Date: 07/29/2021 Time: 0211-1552 SLP Time Calculation (min) (ACUTE ONLY): 18 min  Assessment / Plan / Recommendation Clinical Impression  Pt was seen for dysphagia treatment with sitter present. Pt's sitter reported that the pt coughed with meds which were given with thin liquids. She was advised of recommendation that pt receive meds whole in puree or with nectar thick liquids. Video recording of the swallow study was reviewed and pt verbalized understanding regarding the need for swallowing precautions. Pt again expressed his displeasure regarding the thickened liquids and he stated that he has not been receiving any thin liquids from the unit as he is allowed. Pt required initial cues for observance of swallowing precautions, but these were faded within the session and he demonstrated understanding of swallowing precautions using teach back. Pt's diet will be advanced to regular texture solids and thin liquids. SLP will continue to follow pt.     HPI HPI: 75 Y/O with history of depression presenting with intentional OD of xanax. Intubated 11/20, extubated 11/21. Passed yale but exhibited delayed coughing.      SLP Plan  Continue with current plan of care      Recommendations for follow up therapy are one component of a multi-disciplinary discharge planning process, led by the attending physician.  Recommendations may be updated based on patient status, additional functional criteria and insurance authorization.    Recommendations  Diet recommendations: Regular;Thin liquid Liquids provided via: Cup;No straw Medication Administration: Whole meds with liquid Supervision: Patient able to self feed Compensations: Slow rate;Small sips/bites;Hard cough after swallow;Multiple dry swallows after each bite/sip Postural Changes and/or Swallow Maneuvers: Seated upright  90 degrees                Oral Care Recommendations: Oral care BID Follow Up Recommendations: Home health SLP Assistance recommended at discharge: None SLP Visit Diagnosis: Dysphagia, oropharyngeal phase (R13.12) Plan: Continue with current plan of care       Benetta Maclaren I. Hardin Negus, [place], [place] Office number [phone] Pager [place]  07/29/2021, 1:44 PM

## 2021-07-29 NOTE — Progress Notes (Signed)
CSW received message to call Jeffie Pollock at [facility].  CSW contacted [place].    He requested information on pt's IVC and guardianship status.  CSW provided the information, following clarification from treatment team.  Jeffie Pollock requested to speak with the patient's nurse.  CSW provided contact information for [employer].  CSW also sent secure chat to all nurses and [place] working with the patient.   Disposition still pending.   Assunta Curtis, MSW, LCSW 07/29/2021 1:26 PM

## 2021-07-29 NOTE — Progress Notes (Signed)
CSW spoke with Jeffie Pollock at [facility].  Per Jeffie Pollock everything is going well, however, he needs to staff case with the provider.   He requested infomraiton on patient's placement at discharge.  Disposition CSW spoke with CSW working with the patient who indicated that patient can likely return home.  This sentiment was echoed by treatment team in secure chat.    [place] to follow up after reviewing with provider.   Assunta Curtis, MSW, LCSW 07/29/2021 2:00 PM

## 2021-07-29 NOTE — Progress Notes (Signed)
Patient meets inpatient criteria per Dr. Candie Chroman.   Patient was referred to the following facilities:  Service Provider Address Phone Fax  [facility]  [address] [place] [address] [phone] [facility]  [address] [place] [address] [phone] [place], [place] [address] [phone]  [place]  [place], [place] [address] [place]  [facility]  [address] [place] [address] [phone]  [facility]  [address] [place] [address] [phone] [place]  [address], [place] [address] [phone] [place]  [address] [place] [address] [phone]  [facility]  [address], [place] [address] [phone]  [facility]  [address] [place] [address] [place]  [facility]  [address] [place] [address] [phone]  [facility]  [place], [place] [address] [phone] [facility]  [place], [place] [address] [phone] [place]  [address] [place] [address] [phone] [facility]  [address], [place] [address] [phone]  [facility]  [address], [place] [address] [phone] [facility]  [place], [place] [address] [phone] [facility]  [address] [place] [address] [phone]     CSW will continue to monitor for disposition.  Assunta Curtis, MSW, LCSW 07/29/2021 12:07 PM

## 2021-07-29 NOTE — Progress Notes (Signed)
CSW notified by disposition that patient has a bed available tomorrow at [facility]. CSW sent IVC paperwork over to [facility], awaiting its return to schedule service and send to [facility]. CSW left a Advertising account executive for [employer] to transport tomorrow, awaiting a call back. CSW met with patient to update him on plan to discharge to inpatient psych tomorrow.  Laveda Abbe, [place] Clinical Social Worker [phone]

## 2021-07-29 NOTE — Hospital Course (Signed)
   IVC reason DO NOT DELETE! Patient has a known hx of MDD. Patient attempted to kill himself via overdose on benzodiazepines requiring a 3 day ICU stay. Patient has remained in the hospital and although now medically stable patient has continued to endorse thoughts of hopelessness and worthlessness. Patient endorsed that he wrote a letter telling his wife and son he did not wish to be resucitiated and although initally reporting he was thankful that they had called 36, he reports today that he is somewhat regretting this. Patient has voiced to some staff that when alone he has been having thoughts of ending his life and feels that he should not be on the planet any longer. Patient has displayed very limited insight into why he attempted to kill himself reporting that it was on impulse. Patient has attempted suicide 2 times now. Patient reports that he konws he needs help, but also thinks that if he just finds a "group of guys" that this will be enough and has demonstrated poor judegement regarding reciving further psychiatric care, despite continuing to endorse thoughts of worthlessness and feeling guilty that he is still alive

## 2021-07-29 NOTE — Progress Notes (Signed)
Occupational Therapy Treatment Patient Details Name: DEAVON PODGORSKI MRN: [id number] DOB: April 15, 1946 Today's Date: 07/29/2021   History of present illness 75 yo admitted 11/20 after Xanax OD. Intubated 11/20-11/21. PMhx: suicidal ideation, CAD, PAD, HTN, CVA, depression, HLD   OT comments  Patient seen by skilled OT to address safety with transfers and ADLs. Patient in bed when arrived and was able to get to EOB without assistance. Patient was able to donn and doff socks with supervision and performed toilet transfer without an assistive device with min guard for safety. Patient appeared to want to rush through activities and appeared confident of his abilities. Acute OT to continue to follow.    Recommendations for follow up therapy are one component of a multi-disciplinary discharge planning process, led by the attending physician.  Recommendations may be updated based on patient status, additional functional criteria and insurance authorization.    Follow Up Recommendations  Home health OT    Assistance Recommended at Discharge Frequent or constant Supervision/Assistance  Equipment Recommendations  BSC/3in1    Recommendations for Other Services      Precautions / Restrictions Precautions Precautions: Fall Restrictions Weight Bearing Restrictions: No       Mobility Bed Mobility Overal bed mobility: Needs Assistance Bed Mobility: Supine to Sit;Sit to Supine     Supine to sit: Supervision Sit to supine: Supervision   General bed mobility comments: able to get eob with verbal cue to intiate    Transfers Overall transfer level: Needs assistance Equipment used: None Transfers: Sit to/from Stand Sit to Stand: Min guard           General transfer comment: min guard for safety     Balance Overall balance assessment: Needs assistance Sitting-balance support: Feet supported Sitting balance-Leahy Scale: Fair Sitting balance - Comments: able to doff/donn socks from  eob   Standing balance support: No upper extremity supported;During functional activity;Single extremity supported Standing balance-Leahy Scale: Poor Standing balance comment: able to static standing without support                           ADL either performed or assessed with clinical judgement   ADL Overall ADL's : Needs assistance/impaired                     Lower Body Dressing: Supervision/safety;Sitting/lateral leans Lower Body Dressing Details (indicate cue type and reason): donned and doffed socks without assistance Toilet Transfer: Min guard;Ambulation;Regular Glass blower/designer Details (indicate cue type and reason): performed without an assistive device         Functional mobility during ADLs: Min guard General ADL Comments: did not use an assistive device for mobility    Extremity/Trunk Assessment              Vision       Perception     Praxis      Cognition Arousal/Alertness: Awake/alert Behavior During Therapy: WFL for tasks assessed/performed;Impulsive Overall Cognitive Status: Impaired/Different from baseline Area of Impairment: Awareness;Problem solving;Safety/judgement;Following commands;Memory;Attention                 Orientation Level: Situation;Time;Place Current Attention Level: Sustained Memory: Decreased short-term memory Following Commands: Follows one step commands consistently;Follows one step commands with increased time Safety/Judgement: Decreased awareness of safety;Decreased awareness of deficits Awareness: Emergent Problem Solving: Slow processing;Requires verbal cues General Comments: impulsive and requires cues for safety          Exercises  Shoulder Instructions       General Comments      Pertinent Vitals/ Pain       Pain Assessment: No/denies pain  Home Living                                          Prior Functioning/Environment               Frequency  Min 2X/week        Progress Toward Goals  OT Goals(current goals can now be found in the care plan section)  Progress towards OT goals: Progressing toward goals  Acute Rehab OT Goals Patient Stated Goal: to be discharged from hospital OT Goal Formulation: With patient Time For Goal Achievement: 08/10/21 Potential to Achieve Goals: Good ADL Goals Pt Will Perform Grooming: with modified independence;standing Pt Will Perform Lower Body Dressing: sit to/from stand;with modified independence;with adaptive equipment Pt Will Transfer to Toilet: with modified independence;ambulating Pt Will Perform Toileting - Clothing Manipulation and hygiene: sit to/from stand Additional ADL Goal #1: Pt will demonstrate anticipatory awareness during ADLs.  Plan Discharge plan remains appropriate;Frequency remains appropriate    Co-evaluation                 AM-PAC OT "6 Clicks" Daily Activity     Outcome Measure   Help from another person eating meals?: A Little Help from another person taking care of personal grooming?: A Little Help from another person toileting, which includes using toliet, bedpan, or urinal?: A Little Help from another person bathing (including washing, rinsing, drying)?: A Little Help from another person to put on and taking off regular upper body clothing?: A Little Help from another person to put on and taking off regular lower body clothing?: A Little 6 Click Score: 18    End of Session    OT Visit Diagnosis: Other abnormalities of gait and mobility (R26.89);Muscle weakness (generalized) (M62.81)   Activity Tolerance Patient tolerated treatment well   Patient Left in bed;with call bell/phone within reach;with nursing/sitter in room   Nurse Communication Mobility status        Time: 0998-3382 OT Time Calculation (min): 14 min  Charges: OT General Charges $OT Visit: 1 Visit OT Treatments $Self Care/Home Management : 8-22 mins  Lodema Hong, [place]  Pager [phone] Office [place] 07/29/2021, 1:18 PM

## 2021-07-29 NOTE — Progress Notes (Signed)
PT Cancellation Note  Patient Details Name: Terry Kemp MRN: [id number] DOB: 1946/09/02   Cancelled Treatment:    Reason Eval/Treat Not Completed: Patient declined, no reason specified. Pt reports he has walked 4 times today and also worked with OT earlier. He states he does not feel like working with PT at this time. Perseverating on when he will discharge and where he will go. Will continue to follow.    Thelma Comp 07/29/2021, 3:26 PM  Rolinda Roan, PT, DPT Acute Rehabilitation Services Pager: [phone] Office: [phone]

## 2021-07-29 NOTE — Progress Notes (Signed)
This RN found patient in room with MD and no sitter staff. Informed Ihor Dow who stated that sitter would be present at 1530. This RN will stay in room with patient until sitter relief comes.

## 2021-07-29 NOTE — Consult Note (Signed)
[facility] Consult   Reason for Consult:  Intentional Benzo overdose Referring Physician:  Myrene Buddy, MD Patient Identification: ROBBERT LANGLINAIS MRN:  [id number] Principal Diagnosis: Intentional benzodiazepine overdose [facility]) Diagnosis:  Principal Problem:   Intentional benzodiazepine overdose ([place]) Active Problems:   CAD S/P RCA DES 2006   Essential hypertension   Suicide attempt ([place])   Acute on chronic respiratory failure with hypoxia ([place])   Hyponatremia   Hypokalemia   Acute encephalopathy  Assessment  SAQUAN FURTICK is a 75 y.o. male admitted medically for Intentional benzodiazepine overdose on 07/24/2021 12:33 PM .Patient carries the psychiatric diagnoses of MDD w/ previous SA via OD and anxiety and has a past medical history of PAD, HTN , and MI.Psychiatry was consulted for suicide attempt.   He meets criteria for MDD based on present assessment and EMR.  Outpatient psychotropic medications include Xanax for anxiety and patient reports he has ben taking this patient nightly for sleep, and his (ex)wife (lives with patient) endorses that he keeps them in his pocket for anxiety throughout the day. Historically he has also had Zoloft, Wellbutrin and TMS had a poor response to these medications and treatments. On initial examination, patient appears to endorse continued symptoms of depression as well as medication overuse regarding his Xanax. We plan to recommend that patient have inpatient psychiatry hospitalization, Benzo taper, and consider ECT treatment for depression .   Patient appears to have mild improvement in his insight but overall poor judgement. Patient's reported conversation with the sitter is very concerning, but also suggest that patient's suicide attempt was not as impulsive as has been reported. However, the fact that the patient did eventually act on this thought is concerning and suggest that patient should remain on 1:1 as he appears to be  high risk for acting impulsively on these thoughts to harm himself. Patient's conversation with both sitter and provider continue to suggest that patient has MDD that is uncontrolled, and patient needs psychotropic intervention. Patient insight is very limited, but has improved some with daily psychiatric assessments. Patient's judgement remains poor and interestingly he began asking to have less supervision today, which is concerning. Patient appears to have the cognitive thought distortion of "emotional reasoning" and Cluster B traits contributing to his depression. Patient appears to be ambulating well with his walker.   MDD, recurrent, severe w/o psychotic features Benzodiazapine use disorder - Continue benzo taper: Klonopin 0.5mg  BID for 48 hrs, Klonopin 0.5 QHS for 1 week - Zyprexa 5mg  IM or PO QHS PRN for agitation   Delirium recs 1. Avoid benzodiazepines, antihistamines, anticholinergics, and minimize opiate use as these may worsen delirium. 2: Assess, prevent and manage pain as lack of treatment can result in delirium.  3: Provide appropriate lighting and clear signage; a clock and calendar should be easily visible to the patient. 4: Monitor environmental factors. Reduce light and noise at night (close shades, turn off lights, turn off TV, ect). Correct any alterations in sleep cycle. 5: Reorient the patient to person, place, time and situation on each encounter.  6: Correct sensory deficits if possible (replace eye glasses, hearing aids, ect). 7: Avoid restraints if able. Severely delirious patients benefit from constant observation by a sitter.    Safety Recommend that patient be continues on 1:1 observation.    Legal Patient is his on Legal guardian and his son is his NOK. Patient continues to think about inpatient and is slowly adjusting to the idea; however if placement is found and  patient is unwilling to go would recommend IVC.   Dispo Recommend inpatient psychiatry  hospitalization when medically stable. Total Time spent with patient: 30 minutes  Subjective:   BLAIKE VICKERS is a 75 y.o. male patient admitted with Intentional Benzo overdose.  HPI:  Patient reports that overnight he slept "on and off." Patient reports that he has been hoping to leave soon. Patient reports that he thinks he is ready to leave, "as long as I can find a group of people" patient endorses that he has been feeling lonely and endorses that "I am a social person." Patient reports that he has been having a hard time finding groups of like-minded people. Patient reports that he has been depressed since he sold his house and moved in with his (ex) wife. Patient reports that at the time he had mostly male friends "girlfriends" but his (ex) wife told him he could not longer contact them when he moved back in. Patient reports that he has abided be her rule, but that he has not social outlet and his wife does. Patient reports "she leaves me", "she doesn't take to me to church." Patient reports that he has been looking for groups and recently found a group of men at the [place] that he has enjoyed but has only been able to attend one meeting.  Patient denied current SI, HI and AVH. Patient endorsed that he has been feeling trapped in the hospital and feels that it is cumbersome to have to ask for help and to constantly have a sitter. Patient endorsed he wants less supervision.  Collateral, sitter: Sitter asked to speak with the provider outside the room. Sitter had remained in the room during assessment. Sitter reported that she was very concerned for patient and that he had made concerning statements that were against what he endorsed on assessment. Sitter reports that she and patient went for a walk this AM and during the walk patient had voiced that he had been finding himself thinking of suicide frequently over the last few weeks every time he was alone. Sitter reports the he distinctly told her  that when his wife left the home even for 1 hr to to go to the grocery store he began having thoughts of wanting to harm himself. Patient endorsed to sitter that he had been feeling that his life was not worth living and if he did not have someone to talk to, he began to think about killing himself. Sitter reported that she had also alerted RN about patient's comments.   Past Psychiatric History: MDD, failed Zoloft, Wellbutrin (both induced psychosis ?) and [place]  Risk to Self:   Risk to Others:   Prior Inpatient Therapy:   Prior Outpatient Therapy:    Past Medical History:  Past Medical History:  Diagnosis Date   Anxiety    Arthritis    CAD S/P percutaneous coronary angioplasty 01/2005   ST elevation MI -CATH/PCI-RCA (Cypher DES 3 x 28 - 3.28mm); CATH 2008 FOR INFERIOR ISCHEMIA - 80% dRCA ISR reduced to 40% with NTG - spasm.   Depression    Hyperlipidemia    Hypertension    Myocardial infarction [facility])    2 strokes   PAD (peripheral artery disease) ([place]) 2000   bilateral iliac arteries PTA  and  stenting in 04/30/1998; In 2006-subsequent  R Common Iliac (RCIA) 100% ISR, 75 % LCIA -- staged Bifuracation Kissing Stent creating new Aortic Carina.-- Overlapping 7 mm x 36mm & 51mm stents RCIA &  kissing 7 mm x 29 mm in LCIA.   Stroke [facility]) 2013; 08/2015   a) Felt to be a TIA;; b) Imaging confirmed CVA - mild residual R-sided weakness    Past Surgical History:  Procedure Laterality Date   ABDOMINAL AOTROGRAM  07/17/2007   ADDTION WITH CARDIAC CATH---(INFRARENAL WITH BIFEMORAL RUNOFF )----WIDELY PATENT STENTS TO LEFT AND RIGHT COMMON ILIACS   BACK SURGERY     metal plate   CARDIAC CATHETERIZATION  01/10/2005   Inf STEMI: dLM 50% - calcified. mRCA 100% (infaract-related vessel) --> DES PCI; AbdAo-Iliac Angio: PAD R Common Iliac (RCIA) 100% ISR, LCIA 75% ISR @ outflow tract of the left iliac. LVEF~ 60%   CARDIAC CATHETERIZATION  07/17/2007   40% dLM (previously read as 50%); 80% mRCA - distal  stent ISR --> reduced to 40% with NTG = due to spasm--widely patent bilaeral common illiacs   CORONARY ANGIOPLASTY WITH STENT PLACEMENT  01/10/2005   mRCA DES PCI - CYPHER DES 3.0 x 28 mm -> post-dilated to 3.25 mm   EYE SURGERY Bilateral 2000   cataracts   lower extremities arterial doppler Bilateral 01/26/2005,09/28/2004   01/26/05-- right ABI -MODERATE,LEFT ABI MILLD -RIGHT CIA STENT OCCLUDED ,RIGHT IIA REVERSIBLE FLOW;LEFT CIA >70%--09/28/2004- RGT ABI'S 76, LFT ABI'S102   NM MYOCAR PERF EJECTION FRACTION  06/03/2007   REST/STRESS -- EF 69% MILD INFEROAPICAL ISCHEMIA NOTED - Referred for CATH - RCA PCI   NM MYOVIEW LTD  11/2014   Low Risk - "Diaphragmatic attenuation", no infarct or ischemia   peripheral abd aortic cath  04/04/2005   performed by Dr Rollene Fare---  LCIA 75% ISR, 100% RCIA ISR: kissing balloon PTA  --> Kissing STENTs Bilateral CIA (RCIA 2 overlapping 7.0 x 39 mm & 7.0 x 18 mm Gensis stents, LCIA - 88mm x 29 mm Gensis stent (noted to be patent in 2008 Cath)   peripheral arteries disease  09/1999   status post bilatera iliac stents jan 2001. three stents in the left common iliasc ,1 widely patent stent right common iliac, 2 overlapping stents revealed nio more 10% in-stent restenosis in 2008.    SPINAL FUSION  11/2014   SURGERY BY DR Ellene Route   TRANSTHORACIC ECHOCARDIOGRAM  09/2015   Normal LV size and systolic function, EF 66-44%. Normal RV size.  No valvular lesions.   Family History:  Family History  Problem Relation Age of Onset   Cancer Mother    Heart disease Maternal Grandfather    Family Psychiatric  History: Per EMR adopted Social History:  Social History   Substance and Sexual Activity  Alcohol Use Yes   Alcohol/week: 14.0 standard drinks   Types: 14 Glasses of wine per week     Social History   Substance and Sexual Activity  Drug Use No    Social History   Socioeconomic History   Marital status: Married    Spouse name: Hisako   Number of children: 2    Years of education: college   Highest education level: Not on file  Occupational History   Not on file  Tobacco Use   Smoking status: Former    Packs/day: 1.00    Years: 38.00    Pack years: 38.00    Types: Cigarettes    Quit date: 01/01/2017    Years since quitting: 4.5   Smokeless tobacco: Never  Vaping Use   Vaping Use: Never used  Substance and Sexual Activity   Alcohol use: Yes    Alcohol/week: 14.0 standard drinks  Types: 14 Glasses of wine per week   Drug use: No   Sexual activity: Not on file  Other Topics Concern   Not on file  Social History Narrative   Lives with wife   Right handed   Drinks 2-3 cups caffeine daily   Social Determinants of Health   Financial Resource Strain: Not on file  Food Insecurity: Not on file  Transportation Needs: Not on file  Physical Activity: Not on file  Stress: Not on file  Social Connections: Not on file   Additional Social History:    Allergies:   Allergies  Allergen Reactions   Zoloft [Sertraline Hcl] Other (See Comments)    Psychosis per patient.    Labs: No results found for this or any previous visit (from the past 48 hour(s)).  Current Facility-Administered Medications  Medication Dose Route Frequency Provider Last Rate Last Admin   ALPRAZolam (XANAX) tablet 0.5 mg  0.5 mg Oral Once [place], Gagan S, MD       amLODipine (NORVASC) tablet 10 mg  10 mg Oral Daily Edwin Dada, MD   10 mg at 07/29/21 1610   aspirin EC tablet 81 mg  81 mg Oral Daily Edwin Dada, MD   81 mg at 07/29/21 9604   atorvastatin (LIPITOR) tablet 40 mg  40 mg Oral Daily Edwin Dada, MD   40 mg at 07/29/21 5409   clonazePAM (KLONOPIN) tablet 0.5 mg  0.5 mg Oral BID Oswald Hillock, MD   0.5 mg at 07/29/21 8119   Followed by   Derrill Memo ON 07/30/2021] clonazePAM (KLONOPIN) tablet 0.5 mg  0.5 mg Oral QHS Darrick Meigs, Gagan S, MD       docusate sodium (COLACE) capsule 100 mg  100 mg Oral BID PRN Cristal Generous, NP   100 mg  at 07/29/21 1478   heparin injection 5,000 Units  5,000 Units Subcutaneous Q8H Cristal Generous, NP   5,000 Units at 07/29/21 2956   hydrALAZINE (APRESOLINE) injection 10-40 mg  10-40 mg Intravenous Q4H PRN Anders Simmonds, MD   20 mg at 07/27/21 0410   metoprolol succinate (TOPROL-XL) 24 hr tablet 25 mg  25 mg Oral Daily Edwin Dada, MD   25 mg at 07/29/21 0939   OLANZapine (ZYPREXA) injection 5 mg  5 mg Intramuscular QHS PRN,MR X 1 Andray Assefa B, MD       Or   OLANZapine zydis (ZYPREXA) disintegrating tablet 5 mg  5 mg Oral QHS PRN,MR X 1 Amita Atayde B, MD       ondansetron (ZOFRAN) injection 4 mg  4 mg Intravenous Q6H PRN Bowser, Laurel Dimmer, NP       polyethylene glycol (MIRALAX / GLYCOLAX) packet 17 g  17 g Per Tube Daily PRN Bowser, Laurel Dimmer, NP       ramipril (ALTACE) capsule 10 mg  10 mg Oral Daily Danford, Suann Larry, MD   10 mg at 07/29/21 2130     Psychiatric Specialty Exam:  Presentation  General Appearance: Fairly Groomed  Eye Contact:Good  Speech:Clear and Coherent  Speech Volume:Normal  Handedness:No data recorded  Mood and Affect  Mood:Dysphoric  Affect:Congruent   Thought Process  Thought Processes:Coherent  Descriptions of Associations:Intact  Orientation:Full (Time, Place and Person)  Thought Content:Logical  History of Schizophrenia/Schizoaffective disorder:No data recorded Duration of Psychotic Symptoms:No data recorded Hallucinations:Hallucinations: None  Ideas of Reference:None  Suicidal Thoughts:Suicidal Thoughts: No  Homicidal Thoughts:Homicidal Thoughts: No   Sensorium  Memory:Immediate Good;  Recent Good; Remote Good  Judgment:Poor  Insight:-- (Improving)   Executive Functions  Concentration:Good  Attention Span:Good  Recall:No data recorded [employer] of Knowledge:Good  Language:Good   Psychomotor Activity  Psychomotor Activity:Psychomotor Activity: Normal   Assets  Assets:Communication Skills; Housing;  Data processing manager; Resilience; Leisure Time; Desire for Improvement   Sleep  Sleep:Sleep: Fair   Physical Exam: Physical Exam Constitutional:      Appearance: Normal appearance.  HENT:     Head: Normocephalic and atraumatic.  Pulmonary:     Effort: Pulmonary effort is normal.  Skin:    General: Skin is dry.  Neurological:     Mental Status: He is alert and oriented to person, place, and time.   Review of Systems  Psychiatric/Behavioral:  Positive for depression. Negative for hallucinations.   Blood pressure (!) 141/79, pulse 63, temperature 97.9 F (36.6 C), temperature source Oral, resp. rate 19, height 5\' 6"  (1.676 m), weight 68.2 kg, SpO2 99 %. Body mass index is 24.27 kg/m.   PGY-2 Freida Busman, MD 07/29/2021 11:02 AM

## 2021-07-29 NOTE — Progress Notes (Signed)
Sitter relief has come. Handoff given.

## 2021-07-30 DIAGNOSIS — I2510 Atherosclerotic heart disease of native coronary artery without angina pectoris: Secondary | ICD-10-CM | POA: Diagnosis not present

## 2021-07-30 DIAGNOSIS — I739 Peripheral vascular disease, unspecified: Secondary | ICD-10-CM | POA: Diagnosis not present

## 2021-07-30 DIAGNOSIS — T1491XA Suicide attempt, initial encounter: Secondary | ICD-10-CM | POA: Diagnosis not present

## 2021-07-30 DIAGNOSIS — R4182 Altered mental status, unspecified: Secondary | ICD-10-CM | POA: Diagnosis not present

## 2021-07-30 DIAGNOSIS — T50902A Poisoning by unspecified drugs, medicaments and biological substances, intentional self-harm, initial encounter: Secondary | ICD-10-CM | POA: Diagnosis not present

## 2021-07-30 DIAGNOSIS — R451 Restlessness and agitation: Secondary | ICD-10-CM | POA: Diagnosis not present

## 2021-07-30 DIAGNOSIS — Z79899 Other long term (current) drug therapy: Secondary | ICD-10-CM | POA: Diagnosis not present

## 2021-07-30 DIAGNOSIS — Z712 Person consulting for explanation of examination or test findings: Secondary | ICD-10-CM | POA: Diagnosis not present

## 2021-07-30 DIAGNOSIS — M1380 Other specified arthritis, unspecified site: Secondary | ICD-10-CM | POA: Diagnosis not present

## 2021-07-30 DIAGNOSIS — R45851 Suicidal ideations: Secondary | ICD-10-CM | POA: Diagnosis present

## 2021-07-30 DIAGNOSIS — G4709 Other insomnia: Secondary | ICD-10-CM | POA: Diagnosis not present

## 2021-07-30 DIAGNOSIS — I252 Old myocardial infarction: Secondary | ICD-10-CM | POA: Diagnosis not present

## 2021-07-30 DIAGNOSIS — N390 Urinary tract infection, site not specified: Secondary | ICD-10-CM | POA: Diagnosis not present

## 2021-07-30 DIAGNOSIS — I10 Essential (primary) hypertension: Secondary | ICD-10-CM | POA: Diagnosis present

## 2021-07-30 DIAGNOSIS — F332 Major depressive disorder, recurrent severe without psychotic features: Secondary | ICD-10-CM | POA: Diagnosis present

## 2021-07-30 DIAGNOSIS — E785 Hyperlipidemia, unspecified: Secondary | ICD-10-CM | POA: Diagnosis not present

## 2021-07-30 MED ORDER — ACETAMINOPHEN 325 MG PO TABS
650.0000 mg | ORAL_TABLET | ORAL | Status: DC | PRN
  Administered 2021-07-30: 650 mg via ORAL
  Filled 2021-07-30: qty 2

## 2021-07-30 MED ORDER — DOCUSATE SODIUM 100 MG PO CAPS: 100.0000 mg | ORAL_CAPSULE | Freq: Two times a day (BID) | ORAL | 0 refills | Status: DC | PRN

## 2021-07-30 MED ORDER — CLONAZEPAM 0.5 MG PO TABS: 0.5000 mg | ORAL_TABLET | Freq: Every day | ORAL | 0 refills | Status: DC

## 2021-07-30 MED ORDER — MORPHINE SULFATE (PF) 2 MG/ML IV SOLN: 2.0000 mg | INTRAVENOUS | Status: DC | PRN

## 2021-07-30 MED ORDER — ACETAMINOPHEN 650 MG RE SUPP: 650.0000 mg | RECTAL | Status: DC | PRN

## 2021-07-30 NOTE — Discharge Summary (Signed)
Physician Discharge Summary  Terry Kemp GYJ:[id number] DOB: Jan 18, 1946 DOA: 07/24/2021  PCP: Lawerance Cruel, MD  Admit date: 07/24/2021 Discharge date: 07/30/2021  Time spent: 60 minutes  Recommendations for Outpatient Follow-up:  Discharge to inpatient psych facility   Discharge Diagnoses:  Principal Problem:   Intentional benzodiazepine overdose [facility]) Active Problems:   CAD S/P RCA DES 2006   Essential hypertension   Suicide attempt ([place])   Acute on chronic respiratory failure with hypoxia ([place])   Hyponatremia   Hypokalemia   Acute encephalopathy   Discharge Condition: stable  Diet recommendation: regular diet  Filed Weights   07/25/21 0405 07/26/21 0431 07/30/21 0359  Weight: 66.8 kg 68.2 kg 64.7 kg    History of present illness:  75 year old male with history of CAD s/p PCI greater than 5 years, hypertension, history of CVA with residual right-sided weakness and anxiety presented with intentional Xanax overdose.   Per H&P: "Per pt son the pt communicated to him at 1038 that desire not to live and that he had just taken several xanax. Son arrived to father at 75, where he was very drowsy but able to be aroused when he shook him at first, later being less responsive. EMS was dispatched, arriving at 1110 and found patient unresponsive and possibly pulseless (there are varying reports about if patient received brief CPR). Nasal trumped placed by EMS and given 1x narcan without improvement. BVM to ED.    In ED, unresponsive with snoring respirations and gurgling. Decision made to intubate."     11/20 Intubated and admitted 11/21 Extubated  11/22 Transferred to [facility]; psychiatry started Zyprexa, recommended BZD taper and recommended inpatient psychiatry  Hospital Course:   Intentional benzodiazepine overdose -Patient was intubated, extubated on 07/25/2021 -NG tube removed -Medically stable for discharge -Psych recommended stopping Xanax; started on  clonazepam taper over the next 2 weeks -Continue Zyprexa per psychiatry recommendations -Patient is to go to inpatient psych facility -Benzodiazepine is being tapered  as per psychiatry recommendation.  Continue clonazepam 0.5 mg p.o. nightly daily for 7 days -IVC done before transfer   Acute encephalopathy -Resolved -Secondary to benzodiazepine overdose    Suicide attempt -Psych consulted, recommended inpatient psych hospitalization -   Acute on chronic hypoxic respiratory failure -Patient became obtunded and hypoxic due to Xanax overdose -Required intubation -Respiratory failure is resolved -Weaned off oxygen   Hyponatremia -Mild -Resolved   Hypertension -Blood pressure mildly elevated -Home medications have been resumed -Blood pressure is stable   CAD s/p RCA DES 2006 -Continue aspirin, amlodipine metoprolol   Elevated acetaminophen level -Tylenol level was 36 on admission; mildly elevated -Not treated -No signs of liver injury  Procedures: Intubation and mechanical ventilation  Consultations: CCM Psychiatry  Discharge Exam: Vitals:   07/30/21 0316 07/30/21 0802  BP: (!) 142/67 131/65  Pulse: 69 70  Resp: 18 18  Temp: 98.6 F (37 C) 98.1 F (36.7 C)  SpO2: 94% 96%    General: Appears in no acute distress Cardiovascular: S1-S2, regular, no murmur auscultated Respiratory: Clear to auscultation bilaterally  Discharge Instructions   Discharge Instructions     Diet - low sodium heart healthy   Complete by: As directed    Increase activity slowly   Complete by: As directed       Allergies as of 07/30/2021       Reactions   Zoloft [sertraline Hcl] Other (See Comments)   Psychosis per patient.        Medication List  TAKE these medications    amLODipine 10 MG tablet Commonly known as: NORVASC TAKE 1 TABLET BY MOUTH ONCE DAILY   aspirin EC 81 MG tablet Take 1 tablet (81 mg total) by mouth daily. Swallow whole.   atorvastatin  40 MG tablet Commonly known as: LIPITOR Take 1 tablet by mouth once daily   clonazePAM 0.5 MG tablet Commonly known as: KLONOPIN Take 1 tablet (0.5 mg total) by mouth at bedtime.   docusate sodium 100 MG capsule Commonly known as: COLACE Take 1 capsule (100 mg total) by mouth 2 (two) times daily as needed for mild constipation.   metoprolol succinate 25 MG 24 hr tablet Commonly known as: Toprol XL Take 1 tablet (25 mg total) by mouth daily.   multivitamin tablet Take 1 tablet by mouth daily.   nicotine polacrilex 2 MG lozenge Commonly known as: COMMIT Take 2 mg by mouth as needed for smoking cessation. Reports takeing12 per day   ramipril 10 MG capsule Commonly known as: ALTACE TAKE 1 CAPSULE BY MOUTH DAILY   VITAMIN B-12 CR PO Take by mouth daily. Reported on 08/26/2015       Allergies  Allergen Reactions   Zoloft [Sertraline Hcl] Other (See Comments)    Psychosis per patient.      The results of significant diagnostics from this hospitalization (including imaging, microbiology, ancillary and laboratory) are listed below for reference.    Significant Diagnostic Studies: DG Abd 1 View  Result Date: 07/25/2021 CLINICAL DATA:  NG tube placement EXAM: ABDOMEN - 1 VIEW COMPARISON:  Chest radiograph 07/25/2021. FINDINGS: Single view of the lower chest and upper abdomen demonstrates a gastric tube with tip and side port below the diaphragm, overlying the stomach. The imaged bowel gas pattern is normal. Supine technique limits evaluation for intraperitoneal free air. The imaged lungs are clear. IMPRESSION: 1. Gastric tube tip and side port below the diaphragm, overlying the stomach. 2. No acute abnormality in the imaged chest or upper abdomen. Electronically Signed   By: Merilyn Baba M.D.   On: 07/25/2021 20:26   CT Head Wo Contrast  Result Date: 07/24/2021 CLINICAL DATA:  Pill ingestion, altered mental status EXAM: CT HEAD WITHOUT CONTRAST TECHNIQUE: Contiguous axial  images were obtained from the base of the skull through the vertex without intravenous contrast. COMPARISON:  Brain MRI 03/13/2019, CT head 06/26/2019 FINDINGS: Brain: There is no evidence of acute intracranial hemorrhage, extra-axial fluid collection, or acute infarct. Gray-white differentiation is preserved. There is mild global parenchymal volume loss. The ventricles are not enlarged. Remote infarcts in the left caudate head and body are unchanged. There is no mass lesion. There is no midline shift. Vascular: There is calcification of the bilateral cavernous ICAs. Skull: Normal. Negative for fracture or focal lesion. Sinuses/Orbits: There is mild mucosal thickening in the paranasal sinuses. Bilateral lens implants are in place. The globes and orbits are otherwise unremarkable. Other: Endotracheal and enteric catheters are partially imaged. IMPRESSION: No evidence of acute intracranial pathology. Electronically Signed   By: Valetta Mole M.D.   On: 07/24/2021 18:05   DG Chest Port 1 View  Result Date: 07/25/2021 CLINICAL DATA:  Respiratory failure EXAM: PORTABLE CHEST 1 VIEW COMPARISON:  Chest x-ray 07/24/2021 FINDINGS: Endotracheal tube tip is 2.9 cm above the carina. Enteric tube tip is below the diaphragm. Cardiomediastinal silhouette appears unchanged. No new consolidations identified. No pleural effusion or pneumothorax. IMPRESSION: Medical devices as described.  No acute process identified. Electronically Signed   By: Ofilia Neas  M.D.   On: 07/25/2021 08:15   DG Chest Portable 1 View  Result Date: 07/24/2021 CLINICAL DATA:  Endotracheal tube placement. EXAM: PORTABLE CHEST 1 VIEW COMPARISON:  07/24/2021 at 1 p.m.  Also 06/26/2019 FINDINGS: Nasogastric tube courses into the region of the stomach and off the film as tip is not visualized. Endotracheal tube is present with tip 3.7 cm above the carina. Lungs are adequately inflated with resolution of the previously seen hazy interstitial density  within the lung bases. There is no focal airspace consolidation or effusion. Cardiomediastinal silhouette and remainder of the exam is unchanged. IMPRESSION: 1. Interval resolution of previously seen bibasilar hazy interstitial density. No acute disease. 2. Tubes and lines as described. Electronically Signed   By: Marin Olp M.D.   On: 07/24/2021 14:01   DG Chest Port 1 View  Result Date: 07/24/2021 CLINICAL DATA:  Altered mental status.  Drug overdose. EXAM: PORTABLE CHEST 1 VIEW COMPARISON:  06/26/2019 FINDINGS: Lungs are adequately inflated with hazy interstitial prominence over the lung bases. No effusion. Cardiomediastinal silhouette and remainder of the exam is unchanged. IMPRESSION: Subtle hazy interstitial prominence over the lung bases which may be due to interstitial edema or infection. Electronically Signed   By: Marin Olp M.D.   On: 07/24/2021 13:31   DG Swallowing Func-Speech Pathology  Result Date: 07/27/2021 Table formatting from the original result was not included. Objective Swallowing Evaluation: Type of Study: MBS-Modified Barium Swallow Study  Patient Details Name: Terry Kemp MRN: [id number] Date of Birth: 1945-11-28 Today's Date: 07/27/2021 Time: SLP Start Time (ACUTE ONLY): 1400 -SLP Stop Time (ACUTE ONLY): 1420 SLP Time Calculation (min) (ACUTE ONLY): 20 min Past Medical History: Past Medical History: Diagnosis Date  Anxiety   Arthritis   CAD S/P percutaneous coronary angioplasty 01/2005  ST elevation MI -CATH/PCI-RCA (Cypher DES 3 x 28 - 3.86mm); CATH 2008 FOR INFERIOR ISCHEMIA - 80% dRCA ISR reduced to 40% with NTG - spasm.  Depression   Hyperlipidemia   Hypertension   Myocardial infarction [facility])   2 strokes  PAD (peripheral artery disease) ([place]) 2000  bilateral iliac arteries PTA  and  stenting in 04/30/1998; In 2006-subsequent  R Common Iliac (RCIA) 100% ISR, 75 % LCIA -- staged Bifuracation Kissing Stent creating new Aortic Carina.-- Overlapping 7 mm x 53mm & 53mm  stents RCIA & kissing 7 mm x 29 mm in LCIA.  Stroke [facility]) 2013; 08/2015  a) Felt to be a TIA;; b) Imaging confirmed CVA - mild residual R-sided weakness Past Surgical History: Past Surgical History: Procedure Laterality Date  ABDOMINAL AOTROGRAM  07/17/2007  ADDTION WITH CARDIAC CATH---(INFRARENAL WITH BIFEMORAL RUNOFF )----WIDELY PATENT STENTS TO LEFT AND RIGHT COMMON ILIACS  BACK SURGERY    metal plate  CARDIAC CATHETERIZATION  01/10/2005  Inf STEMI: dLM 50% - calcified. mRCA 100% (infaract-related vessel) --> DES PCI; AbdAo-Iliac Angio: PAD R Common Iliac (RCIA) 100% ISR, LCIA 75% ISR @ outflow tract of the left iliac. LVEF~ 60%  CARDIAC CATHETERIZATION  07/17/2007  40% dLM (previously read as 50%); 80% mRCA - distal stent ISR --> reduced to 40% with NTG = due to spasm--widely patent bilaeral common illiacs  CORONARY ANGIOPLASTY WITH STENT PLACEMENT  01/10/2005  mRCA DES PCI - CYPHER DES 3.0 x 28 mm -> post-dilated to 3.25 mm  EYE SURGERY Bilateral 2000  cataracts  lower extremities arterial doppler Bilateral 01/26/2005,09/28/2004  01/26/05-- right ABI -MODERATE,LEFT ABI MILLD -RIGHT CIA STENT OCCLUDED ,RIGHT IIA REVERSIBLE FLOW;LEFT CIA >70%--09/28/2004- RGT ABI'S 76,  LFT ABI'S102  NM MYOCAR PERF EJECTION FRACTION  06/03/2007  REST/STRESS -- EF 69% MILD INFEROAPICAL ISCHEMIA NOTED - Referred for CATH - RCA PCI  NM MYOVIEW LTD  11/2014  Low Risk - "Diaphragmatic attenuation", no infarct or ischemia  peripheral abd aortic cath  04/04/2005  performed by Dr Rollene Fare---  LCIA 75% ISR, 100% RCIA ISR: kissing balloon PTA  --> Kissing STENTs Bilateral CIA (RCIA 2 overlapping 7.0 x 39 mm & 7.0 x 18 mm Gensis stents, LCIA - 60mm x 29 mm Gensis stent (noted to be patent in 2008 Cath)  peripheral arteries disease  09/1999  status post bilatera iliac stents jan 2001. three stents in the left common iliasc ,1 widely patent stent right common iliac, 2 overlapping stents revealed nio more 10% in-stent restenosis in 2008.   SPINAL  FUSION  11/2014  SURGERY BY DR Ellene Route  TRANSTHORACIC ECHOCARDIOGRAM  09/2015  Normal LV size and systolic function, EF 73-22%. Normal RV size.  No valvular lesions. HPI: 75 Y/O with history of depression presenting with intentional OD of xanax. Intubated 11/20, extubated 11/21. Passed [facility] but exhibited delayed coughing.  No data recorded  Recommendations for follow up therapy are one component of a multi-disciplinary discharge planning process, led by the attending physician.  Recommendations may be updated based on patient status, additional functional criteria and insurance authorization. Assessment / Plan / Recommendation Clinical Impressions 07/27/2021 Clinical Impression Pt presents with oropharyngeal dysphagia characterized by impaired bolus cohesion, a pharyngeal delay, reduced hyolaryngeal elevation, and reduced anterior laryngeal movement. He demonstrated premature spillage to the pyriform sinuses, mild vallecular residue, and pyriform sinus residue. Penetration (PAS 5) and intermittent aspiration (PAS 7,8) of thin liquids were noted during the swallow secondary to premature spillage and the pharyngeal delay. Penetration (PAS 3) was noted after the swallow secondary to spillover of pyriform sinus residue to the larynx. No instances of penetration/aspiration were noted with nectar thick liquids or with solids. Immediate laryngeal invasion was eliminated with individual boluses of thin liquids via cup and risk of penetration/aspiration after the swallow would likely be reduced/eliminated with secondary swallows; however, pt exhibited difficulty consistently demonstrating this strategy. Pt's cough was inconsistent and propelled the aspirate superiorly along the laryngeal surface of the epiglottis; it did not fully expel aspirate, but was effective with trace penetrate. It is recommended that the pt's current diet of regular texture solids and nectar thick liquids be continued. However, pt may have floor-stock  thin liquids with strict observance of swallowing precautions and full supervision to ensure their observance. SLP Visit Diagnosis Dysphagia, oropharyngeal phase (R13.12) Attention and concentration deficit following -- Frontal lobe and executive function deficit following -- Impact on safety and function Mild aspiration risk   Treatment Recommendations 07/27/2021 Treatment Recommendations Therapy as outlined in treatment plan below   Prognosis 07/27/2021 Prognosis for Safe Diet Advancement Good Barriers to Reach Goals Cognitive deficits Barriers/Prognosis Comment -- Diet Recommendations 07/27/2021 SLP Diet Recommendations Regular solids;Nectar thick liquid Liquid Administration via Cup;No straw Medication Administration Whole meds with puree Compensations Slow rate;Small sips/bites;Hard cough after swallow;Multiple dry swallows after each bite/sip Postural Changes Seated upright at 90 degrees   Other Recommendations 07/27/2021 Recommended Consults -- Oral Care Recommendations Oral care BID Other Recommendations -- Follow Up Recommendations No SLP follow up Assistance recommended at discharge None Functional Status Assessment Patient has had a recent decline in their functional status and demonstrates the ability to make significant improvements in function in a reasonable and predictable amount of time. Frequency and  Duration  07/27/2021 Speech Therapy Frequency (ACUTE ONLY) min 2x/week Treatment Duration 2 weeks   Oral Phase 07/27/2021 Oral Phase Impaired Oral - Pudding Teaspoon -- Oral - Pudding Cup -- Oral - Honey Teaspoon -- Oral - Honey Cup -- Oral - Nectar Teaspoon -- Oral - Nectar Cup Decreased bolus cohesion;Premature spillage Oral - Nectar Straw Decreased bolus cohesion;Premature spillage Oral - Thin Teaspoon -- Oral - Thin Cup Decreased bolus cohesion;Premature spillage Oral - Thin Straw Decreased bolus cohesion;Premature spillage Oral - Puree Decreased bolus cohesion Oral - Mech Soft -- Oral - Regular  WFL Oral - Multi-Consistency -- Oral - Pill -- Oral Phase - Comment --  Pharyngeal Phase 07/27/2021 Pharyngeal Phase Impaired Pharyngeal- Pudding Teaspoon -- Pharyngeal -- Pharyngeal- Pudding Cup -- Pharyngeal -- Pharyngeal- Honey Teaspoon -- Pharyngeal -- Pharyngeal- Honey Cup -- Pharyngeal -- Pharyngeal- Nectar Teaspoon -- Pharyngeal -- Pharyngeal- Nectar Cup Pharyngeal residue - valleculae;Pharyngeal residue - pyriform;Delayed swallow initiation-pyriform sinuses;Delayed swallow initiation-vallecula Pharyngeal -- Pharyngeal- Nectar Straw Pharyngeal residue - valleculae;Pharyngeal residue - pyriform;Delayed swallow initiation-pyriform sinuses;Delayed swallow initiation-vallecula Pharyngeal -- Pharyngeal- Thin Teaspoon -- Pharyngeal -- Pharyngeal- Thin Cup Pharyngeal residue - valleculae;Pharyngeal residue - pyriform;Delayed swallow initiation-pyriform sinuses;Delayed swallow initiation-vallecula;Penetration/Aspiration during swallow;Penetration/Apiration after swallow Pharyngeal Material enters airway, CONTACTS cords and not ejected out;Material enters airway, passes BELOW cords and not ejected out despite cough attempt by patient;Material enters airway, passes BELOW cords without attempt by patient to eject out (silent aspiration) Pharyngeal- Thin Straw Pharyngeal residue - valleculae;Pharyngeal residue - pyriform;Delayed swallow initiation-pyriform sinuses;Delayed swallow initiation-vallecula;Penetration/Aspiration during swallow;Penetration/Apiration after swallow Pharyngeal Material enters airway, CONTACTS cords and not ejected out;Material enters airway, passes BELOW cords and not ejected out despite cough attempt by patient Pharyngeal- Puree Pharyngeal residue - valleculae;Pharyngeal residue - pyriform;Delayed swallow initiation-pyriform sinuses;Delayed swallow initiation-vallecula Pharyngeal -- Pharyngeal- Mechanical Soft -- Pharyngeal -- Pharyngeal- Regular Pharyngeal residue - valleculae;Pharyngeal residue  - pyriform;Delayed swallow initiation-pyriform sinuses;Delayed swallow initiation-vallecula Pharyngeal -- Pharyngeal- Multi-consistency -- Pharyngeal -- Pharyngeal- Pill Pharyngeal residue - valleculae;Pharyngeal residue - pyriform;Delayed swallow initiation-pyriform sinuses;Delayed swallow initiation-vallecula Pharyngeal -- Pharyngeal Comment --  Cervical Esophageal Phase  07/27/2021 Cervical Esophageal Phase WFL Pudding Teaspoon -- Pudding Cup -- Honey Teaspoon -- Honey Cup -- Nectar Teaspoon -- Nectar Cup -- Nectar Straw -- Thin Teaspoon -- Thin Cup -- Thin Straw -- Puree -- Mechanical Soft -- Regular -- Multi-consistency -- Pill -- Cervical Esophageal Comment -- Shanika I. Hardin Negus, [place], [place] Office number [phone] Pager [place] 07/27/2021, 4:10 PM                      Microbiology: Recent Results (from the past 240 hour(s))  Resp Panel by RT-PCR (Flu A&B, Covid) Nasopharyngeal Swab     Status: None   Collection Time: 07/24/21 12:45 PM   Specimen: Nasopharyngeal Swab; Nasopharyngeal(NP) swabs in vial transport medium  Result Value Ref Range Status   SARS Coronavirus 2 by RT PCR NEGATIVE NEGATIVE Final    Comment: (NOTE) SARS-CoV-2 target nucleic acids are NOT DETECTED.  The SARS-CoV-2 RNA is generally detectable in upper respiratory specimens during the acute phase of infection. The lowest concentration of SARS-CoV-2 viral copies this assay can detect is 138 copies/mL. A negative result does not preclude SARS-Cov-2 infection and should not be used as the sole basis for treatment or other patient management decisions. A negative result may occur with  improper specimen collection/handling, submission of specimen other than nasopharyngeal swab, presence of viral mutation(s) within the areas targeted by  this assay, and inadequate number of viral copies(<138 copies/mL). A negative result must be combined with clinical observations,  patient history, and epidemiological information. The expected result is Negative.  Fact Sheet for Patients:  EntrepreneurPulse.com.au  Fact Sheet for Healthcare Providers:  IncredibleEmployment.be  This test is no t yet approved or cleared by the [place] FDA and  has been authorized for detection and/or diagnosis of SARS-CoV-2 by FDA under an Emergency Use Authorization (EUA). This EUA will remain  in effect (meaning this test can be used) for the duration of the COVID-19 declaration under Section 564(b)(1) of the Act, 21 U.S.C.section 360bbb-3(b)(1), unless the authorization is terminated  or revoked sooner.       Influenza A by PCR NEGATIVE NEGATIVE Final   Influenza B by PCR NEGATIVE NEGATIVE Final    Comment: (NOTE) The Xpert Xpress SARS-CoV-2/FLU/RSV plus assay is intended as an aid in the diagnosis of influenza from Nasopharyngeal swab specimens and should not be used as a sole basis for treatment. Nasal washings and aspirates are unacceptable for Xpert Xpress SARS-CoV-2/FLU/RSV testing.  Fact Sheet for Patients: EntrepreneurPulse.com.au  Fact Sheet for Healthcare Providers: IncredibleEmployment.be  This test is not yet approved or cleared by the [place] FDA and has been authorized for detection and/or diagnosis of SARS-CoV-2 by FDA under an Emergency Use Authorization (EUA). This EUA will remain in effect (meaning this test can be used) for the duration of the COVID-19 declaration under Section 564(b)(1) of the Act, 21 U.S.C. section 360bbb-3(b)(1), unless the authorization is terminated or revoked.  Performed at [facility], [place] [address] [place], [place] [address]      Labs: Basic Metabolic Panel: Recent Labs  Lab 07/24/21 1244 07/24/21 1517 07/25/21 0444 07/26/21 0148 07/27/21 0141  NA 130* 130* 126* 134* 134*  K 4.1 4.5 3.5 3.4* 3.5  CL 99  --  98 105 104   CO2 25  --  20* 21* 22  GLUCOSE 97  --  137* 122* 96  BUN 8  --  10 <5* 6*  CREATININE 0.75  --  0.79 0.66 0.72  CALCIUM 7.9*  --  8.2* 8.3* 8.6*  MG  --   --  1.7 2.1 1.9   Liver Function Tests: Recent Labs  Lab 07/24/21 1244 07/26/21 0148  AST 18 15  ALT 19 16  ALKPHOS 60 50  BILITOT 0.7 0.9  PROT 5.9* 5.7*  ALBUMIN 3.4* 3.2*   Recent Labs  Lab 07/24/21 1244  LIPASE 32   No results for input(s): AMMONIA in the last 168 hours. CBC: Recent Labs  Lab 07/24/21 1517 07/25/21 0444 07/26/21 0148  WBC  --  8.4 9.0  HGB 12.9* 14.2 13.6  HCT 38.0* 39.7 38.1*  MCV  --  85.6 87.8  PLT  --  195 183   Cardiac Enzymes: No results for input(s): CKTOTAL, CKMB, CKMBINDEX, TROPONINI in the last 168 hours. BNP: BNP (last 3 results) No results for input(s): BNP in the last 8760 hours.  ProBNP (last 3 results) No results for input(s): PROBNP in the last 8760 hours.  CBG: Recent Labs  Lab 07/25/21 1939 07/25/21 2348 07/26/21 0344 07/26/21 0703 07/26/21 1055  GLUCAP 143* 115* 115* 101* 121*       Signed:  Oswald Hillock MD.  [facility] 07/30/2021, 10:49 AM

## 2021-07-30 NOTE — Plan of Care (Signed)
  Problem: Education: Goal: Knowledge of warning signs, risks, and behaviors that relate to suicide ideation and self-harm behaviors will improve Outcome: Progressing   Problem: Health Behavior/Discharge (Transition) Planning: Goal: Ability to manage health-related needs will improve Outcome: Progressing   Problem: Clinical Measurements: Goal: Remain free from any harm during hospitalization Outcome: Progressing   Problem: Coping: Goal: Ability to disclose and discuss thoughts of suicide and self-harm will improve Outcome: Progressing   Problem: Medication Management: Goal: Adhere to prescribed medication regimen Outcome: Progressing   Problem: Sleep Hygiene: Goal: Ability to obtain adequate restful sleep will improve Outcome: Progressing   Problem: Self Esteem: Goal: Ability to verbalize positive feeling about self will improve Outcome: Progressing

## 2021-07-30 NOTE — Plan of Care (Signed)
  Problem: Education: Goal: Knowledge of warning signs, risks, and behaviors that relate to suicide ideation and self-harm behaviors will improve Outcome: Completed/Met   Problem: Health Behavior/Discharge (Transition) Planning: Goal: Ability to manage health-related needs will improve Outcome: Completed/Met   Problem: Clinical Measurements: Goal: Remain free from any harm during hospitalization Outcome: Completed/Met   Problem: Nutrition: Goal: Adequate fluids and nutrition will be maintained Outcome: Completed/Met   Problem: Coping: Goal: Ability to disclose and discuss thoughts of suicide and self-harm will improve Outcome: Completed/Met   Problem: Medication Management: Goal: Adhere to prescribed medication regimen Outcome: Completed/Met   Problem: Sleep Hygiene: Goal: Ability to obtain adequate restful sleep will improve Outcome: Completed/Met   Problem: Self Esteem: Goal: Ability to verbalize positive feeling about self will improve Outcome: Completed/Met   Discussed terms of discharge to [facility] with patient, who understands that he will be discharging as soon as physician writes order and ride can be aligned.  He spoke with his family re: his discharge as well.

## 2021-07-30 NOTE — TOC Transition Note (Signed)
Transition of Care [facility]) - CM/SW Discharge Note   Patient Details  Name: Terry Kemp MRN: [id number] Date of Birth: 02-28-1946  [facility]) CM/SW Contact:  Coralee Pesa, [place] Phone Number: 07/30/2021, 11:05 AM   Clinical Narrative:    Pt to be transported to [facility], via [place], under IVC.  Nurse to call report to [phone], going to [place] unit.   Final next level of care: [facility] Barriers to Discharge: Barriers Resolved   Patient Goals and CMS Choice        Discharge Placement              Patient chooses bed at:  [facility]) Patient to be transferred to facility by: [facility] Name of family member notified: Patient Patient and family notified of of transfer: 07/30/21  Discharge Plan and Services                                     Social Determinants of Health (SDOH) Interventions     Readmission Risk Interventions No flowsheet data found.

## 2021-07-30 NOTE — Progress Notes (Signed)
Hand-off report given to receiving facility [facility]).  [facility] department notified of need for transport to this facility.

## 2021-07-30 NOTE — Progress Notes (Signed)
Patient discharged to [facility] via [employer] escort (2 officers).  Escorted to exit by nurse tech via wheelchair.

## 2021-08-15 DIAGNOSIS — F331 Major depressive disorder, recurrent, moderate: Secondary | ICD-10-CM | POA: Diagnosis not present

## 2021-08-15 DIAGNOSIS — G479 Sleep disorder, unspecified: Secondary | ICD-10-CM | POA: Diagnosis not present

## 2021-08-15 DIAGNOSIS — F411 Generalized anxiety disorder: Secondary | ICD-10-CM | POA: Diagnosis not present

## 2021-08-22 DIAGNOSIS — G4700 Insomnia, unspecified: Secondary | ICD-10-CM | POA: Diagnosis not present

## 2021-08-22 DIAGNOSIS — I2510 Atherosclerotic heart disease of native coronary artery without angina pectoris: Secondary | ICD-10-CM | POA: Diagnosis not present

## 2021-08-22 DIAGNOSIS — E7800 Pure hypercholesterolemia, unspecified: Secondary | ICD-10-CM | POA: Diagnosis not present

## 2021-08-22 DIAGNOSIS — J449 Chronic obstructive pulmonary disease, unspecified: Secondary | ICD-10-CM | POA: Diagnosis not present

## 2021-08-22 DIAGNOSIS — F331 Major depressive disorder, recurrent, moderate: Secondary | ICD-10-CM | POA: Diagnosis not present

## 2021-08-22 DIAGNOSIS — I10 Essential (primary) hypertension: Secondary | ICD-10-CM | POA: Diagnosis not present

## 2021-08-25 DIAGNOSIS — Q179 Congenital malformation of ear, unspecified: Secondary | ICD-10-CM | POA: Diagnosis not present

## 2021-08-25 DIAGNOSIS — E559 Vitamin D deficiency, unspecified: Secondary | ICD-10-CM | POA: Diagnosis not present

## 2021-08-25 DIAGNOSIS — F331 Major depressive disorder, recurrent, moderate: Secondary | ICD-10-CM | POA: Diagnosis not present

## 2021-08-25 DIAGNOSIS — I10 Essential (primary) hypertension: Secondary | ICD-10-CM | POA: Diagnosis not present

## 2021-08-25 DIAGNOSIS — Z125 Encounter for screening for malignant neoplasm of prostate: Secondary | ICD-10-CM | POA: Diagnosis not present

## 2021-08-25 DIAGNOSIS — I69359 Hemiplegia and hemiparesis following cerebral infarction affecting unspecified side: Secondary | ICD-10-CM | POA: Diagnosis not present

## 2021-08-25 DIAGNOSIS — E7800 Pure hypercholesterolemia, unspecified: Secondary | ICD-10-CM | POA: Diagnosis not present

## 2021-08-25 DIAGNOSIS — I739 Peripheral vascular disease, unspecified: Secondary | ICD-10-CM | POA: Diagnosis not present

## 2021-08-25 DIAGNOSIS — D485 Neoplasm of uncertain behavior of skin: Secondary | ICD-10-CM | POA: Diagnosis not present

## 2021-08-25 DIAGNOSIS — I679 Cerebrovascular disease, unspecified: Secondary | ICD-10-CM | POA: Diagnosis not present

## 2021-08-25 DIAGNOSIS — J449 Chronic obstructive pulmonary disease, unspecified: Secondary | ICD-10-CM | POA: Diagnosis not present

## 2021-08-25 DIAGNOSIS — Z0000 Encounter for general adult medical examination without abnormal findings: Secondary | ICD-10-CM | POA: Diagnosis not present

## 2021-09-13 DIAGNOSIS — F411 Generalized anxiety disorder: Secondary | ICD-10-CM | POA: Diagnosis not present

## 2021-09-13 DIAGNOSIS — F331 Major depressive disorder, recurrent, moderate: Secondary | ICD-10-CM | POA: Diagnosis not present

## 2021-09-16 DIAGNOSIS — J449 Chronic obstructive pulmonary disease, unspecified: Secondary | ICD-10-CM | POA: Diagnosis not present

## 2021-09-16 DIAGNOSIS — I10 Essential (primary) hypertension: Secondary | ICD-10-CM | POA: Diagnosis not present

## 2021-09-16 DIAGNOSIS — F331 Major depressive disorder, recurrent, moderate: Secondary | ICD-10-CM | POA: Diagnosis not present

## 2021-09-16 DIAGNOSIS — G4700 Insomnia, unspecified: Secondary | ICD-10-CM | POA: Diagnosis not present

## 2021-09-16 DIAGNOSIS — E7800 Pure hypercholesterolemia, unspecified: Secondary | ICD-10-CM | POA: Diagnosis not present

## 2021-09-16 DIAGNOSIS — I2510 Atherosclerotic heart disease of native coronary artery without angina pectoris: Secondary | ICD-10-CM | POA: Diagnosis not present

## 2021-09-22 DIAGNOSIS — Z79899 Other long term (current) drug therapy: Secondary | ICD-10-CM | POA: Diagnosis not present

## 2021-09-23 DIAGNOSIS — F331 Major depressive disorder, recurrent, moderate: Secondary | ICD-10-CM | POA: Diagnosis not present

## 2021-09-28 DIAGNOSIS — C44319 Basal cell carcinoma of skin of other parts of face: Secondary | ICD-10-CM | POA: Diagnosis not present

## 2021-09-30 DIAGNOSIS — F331 Major depressive disorder, recurrent, moderate: Secondary | ICD-10-CM | POA: Diagnosis not present

## 2021-10-05 DIAGNOSIS — F411 Generalized anxiety disorder: Secondary | ICD-10-CM | POA: Diagnosis not present

## 2021-10-05 DIAGNOSIS — Z79891 Long term (current) use of opiate analgesic: Secondary | ICD-10-CM | POA: Diagnosis not present

## 2021-10-05 DIAGNOSIS — F331 Major depressive disorder, recurrent, moderate: Secondary | ICD-10-CM | POA: Diagnosis not present

## 2021-10-14 DIAGNOSIS — F411 Generalized anxiety disorder: Secondary | ICD-10-CM | POA: Diagnosis not present

## 2021-10-14 DIAGNOSIS — F331 Major depressive disorder, recurrent, moderate: Secondary | ICD-10-CM | POA: Diagnosis not present

## 2021-11-10 DIAGNOSIS — I209 Angina pectoris, unspecified: Secondary | ICD-10-CM | POA: Diagnosis not present

## 2021-11-10 DIAGNOSIS — R0609 Other forms of dyspnea: Secondary | ICD-10-CM | POA: Diagnosis not present

## 2021-11-10 DIAGNOSIS — R233 Spontaneous ecchymoses: Secondary | ICD-10-CM | POA: Diagnosis not present

## 2021-12-02 ENCOUNTER — Encounter: Payer: Self-pay | Admitting: Cardiology

## 2021-12-02 ENCOUNTER — Ambulatory Visit (INDEPENDENT_AMBULATORY_CARE_PROVIDER_SITE_OTHER): Payer: Medicare Other | Admitting: Cardiology

## 2021-12-02 VITALS — BP 138/80 | HR 62 | Ht 66.0 in | Wt 149.2 lb

## 2021-12-02 DIAGNOSIS — Z9861 Coronary angioplasty status: Secondary | ICD-10-CM | POA: Diagnosis not present

## 2021-12-02 DIAGNOSIS — I2510 Atherosclerotic heart disease of native coronary artery without angina pectoris: Secondary | ICD-10-CM

## 2021-12-02 DIAGNOSIS — R0609 Other forms of dyspnea: Secondary | ICD-10-CM | POA: Diagnosis not present

## 2021-12-02 DIAGNOSIS — I208 Other forms of angina pectoris: Secondary | ICD-10-CM | POA: Diagnosis not present

## 2021-12-02 DIAGNOSIS — E785 Hyperlipidemia, unspecified: Secondary | ICD-10-CM | POA: Diagnosis not present

## 2021-12-02 DIAGNOSIS — Z87891 Personal history of nicotine dependence: Secondary | ICD-10-CM

## 2021-12-02 DIAGNOSIS — I739 Peripheral vascular disease, unspecified: Secondary | ICD-10-CM | POA: Diagnosis not present

## 2021-12-02 DIAGNOSIS — I10 Essential (primary) hypertension: Secondary | ICD-10-CM

## 2021-12-02 NOTE — Patient Instructions (Signed)
Medication Instructions:  ?Continue same medication ?*If you need a refill on your cardiac medications before your next appointment, please call your pharmacy* ? ? ?Lab Work: ?None ordered ? ? ?Testing/Procedures: ?Schedule Stress Myoview    Take your Metoprolol do not hold ? ?Schedule Echo ? ? ?Follow-Up: ?At [facility], you and your health needs are our priority.  As part of our continuing mission to provide you with exceptional heart care, we have created designated Provider Care Teams.  These Care Teams include your primary Cardiologist (physician) and Advanced Practice Providers (APPs -  Physician Assistants and Nurse Practitioners) who all work together to provide you with the care you need, when you need it. ? ?We recommend signing up for the patient portal called "MyChart".  Sign up information is provided on this After Visit Summary.  MyChart is used to connect with patients for Virtual Visits (Telemedicine).  Patients are able to view lab/test results, encounter notes, upcoming appointments, etc.  Non-urgent messages can be sent to your provider as well.   ?To learn more about what you can do with MyChart, go to [email or web address].   ? ?Your next appointment:  After test ?  ? ?The format for your next appointment: Office  ? ? ?Provider:  Dr.Harding ? ? ? ? ?

## 2021-12-02 NOTE — Progress Notes (Signed)
? ?Primary Care Provider: Lawerance Cruel, MD ?Cardiologist: None ?Electrophysiologist: None ? ?Clinic Note: ?No chief complaint on file. ? ? ?=================================== ? ?ASSESSMENT/PLAN  ? ?Problem List Items Addressed This Visit   ? ?  ? Cardiology Problems  ? CAD S/P RCA DES 2006 (Chronic)  ?  PCI 2006 and now with progressively worsening dyspnea and some chest tightness. ? ?Plan: Continue aspirin (he had been on Plavix combination of PAD, CAD and stroke, but no longer). ?He is on beta-blocker and calcium channel blocker for antianginal effect, along with ACE inhibitor for hypertension.-Blood pressure is little elevated, but can reassess to see if this is a standing level.  He does have room to increase Toprol a little bit from a dose perspective, but not necessarily for heart rate effect.. ? ?With now progression exertional dyspnea, we will do an ischemic evaluation with Myoview stress test.  I would like to see how he does walking on his meds.  Hopefully this can be done as a "symptom limited "Myoview. ? ?For now we will continue with beta-blocker aspirin statin, Calcitrel blocker and ACE inhibitor. ? ?Shared Decision Making/Informed Consent{ ?The risks [chest pain, shortness of breath, cardiac arrhythmias, dizziness, blood pressure fluctuations, myocardial infarction, stroke/transient ischemic attack, nausea, vomiting, allergic reaction, radiation exposure, metallic taste sensation and life-threatening complications (estimated to be 1 in 10,000)], benefits (risk stratification, diagnosing coronary artery disease, treatment guidance) and alternatives of a nuclear stress test were discussed in detail with Terry Kemp and he agrees to proceed. ? ?  ?  ? Relevant Medications  ? nitroGLYCERIN (NITROSTAT) 0.4 MG SL tablet  ? Other Relevant Orders  ? EKG 12-Lead  ? MYOCARDIAL PERFUSION IMAGING  ? ECHOCARDIOGRAM COMPLETE  ? PAD (peripheral artery disease) (HCC) (Chronic)  ?  No comment on  claudication. ? ?Continue factor modification with aspirin statin beta-blocker, ACE inhibitor and calcium blocker.  On aspirin but no bleeding. ?  ?  ? Relevant Medications  ? nitroGLYCERIN (NITROSTAT) 0.4 MG SL tablet  ? Hyperlipidemia with target LDL less than 70 - Primary (Chronic)  ?  He had labs checked by PCP in December.  LDL at goal at 65. ?Plan: Continue current dose of atorvastatin 40 mg (however with memory issues, could consider converting to similar dose of rosuvastatin) ?Terry Kemp is following labs. ?  ?  ? Relevant Medications  ? nitroGLYCERIN (NITROSTAT) 0.4 MG SL tablet  ? Other Relevant Orders  ? EKG 12-Lead  ? MYOCARDIAL PERFUSION IMAGING  ? ECHOCARDIOGRAM COMPLETE  ? Essential hypertension (Chronic)  ?  Blood pressure is borderline today current meds.  He is not on diuretic which would be the next. ?  ?  ? Relevant Medications  ? nitroGLYCERIN (NITROSTAT) 0.4 MG SL tablet  ? Other Relevant Orders  ? EKG 12-Lead  ? MYOCARDIAL PERFUSION IMAGING  ? ECHOCARDIOGRAM COMPLETE  ? Atypical angina ([place])  ?  Is been a long time since he actually truly had angina back in 2006.  He is not able to recall those symptoms.  He is however noticing progressively worsening dyspnea occasionally associated with chest tightness.  No real improvement with inhalers. ?Concerning potentially progressive angina. ? ?Plan: Treadmill Myoview ?  ?  ? Relevant Medications  ? nitroGLYCERIN (NITROSTAT) 0.4 MG SL tablet  ? Other Relevant Orders  ? EKG 12-Lead  ? MYOCARDIAL PERFUSION IMAGING  ? ECHOCARDIOGRAM COMPLETE  ?  ? Other  ? Former cigarette smoker of unknown amount (Chronic)  ?  I Congratulated  him on his efforts cessation. ?  ?  ? DOE (dyspnea on exertion)  ?  Progressively worsening exertional dyspnea with at least 1 episode of chest tightness.Marland Kitchen   ?Concern for possible anginal equivalent. ? ?Plan: Check 2D echocardiogram and TM Myoview.  (Hopefully with symptom limited) ?I would like to do the treadmill portion for the  Myoview to evaluate his exercise tolerance. ?  ?  ? Relevant Orders  ? EKG 12-Lead  ? MYOCARDIAL PERFUSION IMAGING  ? ECHOCARDIOGRAM COMPLETE  ? ? ?=================================== ? ?HPI:   ? ?Terry Kemp is a 76 y.o. male former smoker with a PMH notable for CAD-PCI as well as CVA/TIA, PAD HTN, and HLD who o is being seen today to [facility] with complaints of CHEST TIGHTNESS and DYSPNEA ON EXERTION at the request of Terry Cruel, MD. ? ?1. 04/2005 -Inferior STEMI: CAD S/P RCA DES  - Negative Myoview March 2016  ?2. Essential hypertension   ?3. Cerebrovascular accident (CVA) due to embolism of other precerebral artery ([place]) - also TIA; another CVA in @ 03/2019 => residual right-sided weakness/hemiparesis and significant short-term memory loss  ?4. Hyperlipidemia with target LDL less than 70   ?5. PAD - s/p Bilateral Iliac Stents (2000 & 2001) - Terry Kemp.  ?6. Former heavy smoker - 21 Pk year.  Quit intermittently between 2017 and 2018, but finally quit in 2020  ?7. MDD - h/o attempted OD  ? ?I last saw Terry Kemp in October 2019 (prior to that I saw him in September 2017-have not seen Terry Kemp, [place] in October 2018).  Was relatively stable cardiac standpoint.  Was try to get back into his daily exercise routine of walking about 1/2 to 1 mile a day.  No chest pain or pressure.  No claudication and no recurrent stroke symptoms.  No medication changes. ? ?Recent Hospitalizations:  ?November 20-26, 2022: Intentional Xanax overdose => required intubation for airway management.  Stabilized and discharged to behavioral health. ? ?Seen by Terry Kemp on March 9 with complaints of progressively worsening exertional dyspnea with minimal exertion-about 1 week history.  Also had some chest tightness associated with some wheezing, but noted minimal benefit from his albuterol. ?=> Still nitroglycerin ordered and referred back to cardiology. ? ? ?Reviewed  CV studies:   ? ?The following  studies were reviewed today: (if available, images/films reviewed: From Epic Chart or Care Everywhere) ?No recent studies: ? ?Interval History:  ? ?Terry Kemp presents here today at the request of Terry Kemp for progressively worsening dyspnea.  Terry Kemp is a very pleasant gentleman but he has very tangential speech and hard to keep on topic.  Since I had not seen him in several years, he reminded me of his history of stroke, psychological/psychosocial issues including moving back in with his Ex-wife.Marland Kitchen  He reiterated that his level of depression and also significant memory loss.  He has residual right-sided weakness from stroke, some stuttering speech and significant memory loss. ? ?Today he notes that over the last 9 months or so he has been getting progressively worsening exertional dyspnea.  He recently had an episode where he got extremely short of breath while pushing a wheelbarrow across the yard.  He felt a tightness in his chest with extreme dyspnea and wheezing.  Overall, the episode lasted about 10 minutes and he was extremely fatigued.  Since then he has been noticing exertional dyspnea with less activity.  Not able to ascertain level of exercise.  He used to exercise routinely, but is also become quite sedentary-spends most of the day sitting around.  Mostly because he feels dizzy has no energy level to do anything. ? ?States he is a very difficult historian, it is difficult to tell the timeline of when the symptoms occurred and if he had any specific episodes of chest discomfort besides the one time personally we will bear.  It seems that he has been short of breath with minimal activity over the last couple months.  Does not describe any symptoms of PND orthopnea edema.  Did not describe any symptoms of irregular heartbeats or palpitations. ? ?CV Review of Symptoms (Summary) ?Cardiovascular ROS: positive for - chest pain, dyspnea on exertion, and some dizziness and poor balance.  Generalized  weakness. ?negative for - edema, irregular heartbeat, orthopnea, palpitations, paroxysmal nocturnal dyspnea, rapid heart rate, or recurrent symptoms of TIA/amaurosis fugax, claudication ? ?REVIEWED OF SYSTEMS  ? ?Review of Sys

## 2021-12-03 ENCOUNTER — Encounter: Payer: Self-pay | Admitting: Cardiology

## 2021-12-03 NOTE — Assessment & Plan Note (Signed)
No comment on claudication. ? ?Continue factor modification with aspirin statin beta-blocker, ACE inhibitor and calcium blocker.  On aspirin but no bleeding. ?

## 2021-12-03 NOTE — Assessment & Plan Note (Signed)
He had labs checked by PCP in December.  LDL at goal at 65. ?Plan: Continue current dose of atorvastatin 40 mg (however with memory issues, could consider converting to similar dose of rosuvastatin) ?Dr. Harrington Challenger is following labs. ?

## 2021-12-03 NOTE — Assessment & Plan Note (Signed)
I Congratulated him on his efforts cessation. ?

## 2021-12-03 NOTE — Assessment & Plan Note (Signed)
Progressively worsening exertional dyspnea with at least 1 episode of chest tightness.Marland Kitchen   ?Concern for possible anginal equivalent. ? ?Plan: Check 2D echocardiogram and TM Myoview.  (Hopefully with symptom limited) ?I would like to do the treadmill portion for the Myoview to evaluate his exercise tolerance. ?

## 2021-12-03 NOTE — Assessment & Plan Note (Signed)
Blood pressure is borderline today current meds.  He is not on diuretic which would be the next. ?

## 2021-12-03 NOTE — Assessment & Plan Note (Signed)
PCI 2006 and now with progressively worsening dyspnea and some chest tightness. ? ?Plan: Continue aspirin (he had been on Plavix combination of PAD, CAD and stroke, but no longer). ?He is on beta-blocker and calcium channel blocker for antianginal effect, along with ACE inhibitor for hypertension.-Blood pressure is little elevated, but can reassess to see if this is a standing level.  He does have room to increase Toprol a little bit from a dose perspective, but not necessarily for heart rate effect.. ? ?With now progression exertional dyspnea, we will do an ischemic evaluation with Myoview stress test.  I would like to see how he does walking on his meds.  Hopefully this can be done as a "symptom limited "Myoview. ? ?For now we will continue with beta-blocker aspirin statin, Calcitrel blocker and ACE inhibitor. ? ?Shared Decision Making/Informed Consent{ ?The risks [chest pain, shortness of breath, cardiac arrhythmias, dizziness, blood pressure fluctuations, myocardial infarction, stroke/transient ischemic attack, nausea, vomiting, allergic reaction, radiation exposure, metallic taste sensation and life-threatening complications (estimated to be 1 in 10,000)], benefits (risk stratification, diagnosing coronary artery disease, treatment guidance) and alternatives of a nuclear stress test were discussed in detail with Terry Kemp and he agrees to proceed. ? ?

## 2021-12-03 NOTE — Assessment & Plan Note (Signed)
Is been a long time since he actually truly had angina back in 2006.  He is not able to recall those symptoms.  He is however noticing progressively worsening dyspnea occasionally associated with chest tightness.  No real improvement with inhalers. ?Concerning potentially progressive angina. ? ?Plan: Treadmill Myoview ?

## 2021-12-05 NOTE — Addendum Note (Signed)
Addended by: Raiford Simmonds on: 12/05/2021 10:37 AM ? ? Modules accepted: Orders ? ?

## 2021-12-05 NOTE — Addendum Note (Signed)
Addended by: Leonie Man on: 12/05/2021 10:15 PM ? ? Modules accepted: Orders ? ?

## 2021-12-14 ENCOUNTER — Telehealth (HOSPITAL_COMMUNITY): Payer: Self-pay | Admitting: *Deleted

## 2021-12-14 ENCOUNTER — Encounter (HOSPITAL_COMMUNITY): Payer: Self-pay | Admitting: *Deleted

## 2021-12-14 NOTE — Telephone Encounter (Signed)
Left message on voicemail per DPR in reference to upcoming appointment scheduled on 12/21/21 at 1045 with detailed instructions given per Myocardial Perfusion Study Information Sheet for the test. LM to arrive 15 minutes early, and that it is imperative to arrive on time for appointment to keep from having the test rescheduled. If you need to cancel or reschedule your appointment, please call the office within 24 hours of your appointment. Failure to do so may result in a cancellation of your appointment, and a $50 no show fee. Phone number given for call back for any questions. Mychart letter sent. Zionna Homewood, [place] ? ? ?

## 2021-12-21 ENCOUNTER — Ambulatory Visit (HOSPITAL_BASED_OUTPATIENT_CLINIC_OR_DEPARTMENT_OTHER): Payer: Medicare Other

## 2021-12-21 ENCOUNTER — Encounter (HOSPITAL_COMMUNITY): Payer: Self-pay | Admitting: *Deleted

## 2021-12-21 ENCOUNTER — Ambulatory Visit (HOSPITAL_COMMUNITY): Payer: Medicare Other | Attending: Internal Medicine

## 2021-12-21 VITALS — Ht 66.0 in | Wt 149.0 lb

## 2021-12-21 DIAGNOSIS — I208 Other forms of angina pectoris: Secondary | ICD-10-CM | POA: Diagnosis not present

## 2021-12-21 DIAGNOSIS — Z9861 Coronary angioplasty status: Secondary | ICD-10-CM

## 2021-12-21 DIAGNOSIS — I2510 Atherosclerotic heart disease of native coronary artery without angina pectoris: Secondary | ICD-10-CM | POA: Diagnosis not present

## 2021-12-21 DIAGNOSIS — E785 Hyperlipidemia, unspecified: Secondary | ICD-10-CM | POA: Insufficient documentation

## 2021-12-21 DIAGNOSIS — I10 Essential (primary) hypertension: Secondary | ICD-10-CM | POA: Insufficient documentation

## 2021-12-21 DIAGNOSIS — R0609 Other forms of dyspnea: Secondary | ICD-10-CM | POA: Diagnosis not present

## 2021-12-21 HISTORY — PX: TRANSTHORACIC ECHOCARDIOGRAM: SHX275

## 2021-12-21 LAB — ECHOCARDIOGRAM COMPLETE
Area-P 1/2: 3.48 cm2
S' Lateral: 2.5 cm

## 2021-12-21 MED ORDER — TECHNETIUM TC 99M TETROFOSMIN IV KIT
9.6000 | PACK | Freq: Once | INTRAVENOUS | Status: AC | PRN
  Administered 2021-12-21: 9.6 via INTRAVENOUS
  Filled 2021-12-21: qty 10

## 2021-12-26 ENCOUNTER — Ambulatory Visit (HOSPITAL_COMMUNITY): Payer: Medicare Other | Attending: Cardiology

## 2021-12-26 DIAGNOSIS — Z9861 Coronary angioplasty status: Secondary | ICD-10-CM | POA: Diagnosis not present

## 2021-12-26 DIAGNOSIS — I10 Essential (primary) hypertension: Secondary | ICD-10-CM | POA: Diagnosis not present

## 2021-12-26 DIAGNOSIS — I2510 Atherosclerotic heart disease of native coronary artery without angina pectoris: Secondary | ICD-10-CM | POA: Diagnosis not present

## 2021-12-26 DIAGNOSIS — I208 Other forms of angina pectoris: Secondary | ICD-10-CM | POA: Diagnosis not present

## 2021-12-26 DIAGNOSIS — E785 Hyperlipidemia, unspecified: Secondary | ICD-10-CM | POA: Diagnosis not present

## 2021-12-26 DIAGNOSIS — R0609 Other forms of dyspnea: Secondary | ICD-10-CM | POA: Insufficient documentation

## 2021-12-26 HISTORY — PX: NM MYOVIEW LTD: HXRAD82

## 2021-12-26 LAB — MYOCARDIAL PERFUSION IMAGING
LV dias vol: 57 mL (ref 62–150)
LV sys vol: 18 mL
Nuc Stress EF: 68 %
Peak HR: 67 {beats}/min
Rest HR: 55 {beats}/min
Rest Nuclear Isotope Dose: 9.6 mCi
SDS: 0
SRS: 3
SSS: 0
ST Depression (mm): 0 mm
Stress Nuclear Isotope Dose: 29.6 mCi
TID: 1.11

## 2021-12-26 MED ORDER — TECHNETIUM TC 99M TETROFOSMIN IV KIT
29.6000 | PACK | Freq: Once | INTRAVENOUS | Status: AC | PRN
  Administered 2021-12-26: 29.6 via INTRAVENOUS
  Filled 2021-12-26: qty 30

## 2021-12-26 MED ORDER — REGADENOSON 0.4 MG/5ML IV SOLN
0.4000 mg | Freq: Once | INTRAVENOUS | Status: AC
  Administered 2021-12-26: 0.4 mg via INTRAVENOUS

## 2022-01-06 DIAGNOSIS — F331 Major depressive disorder, recurrent, moderate: Secondary | ICD-10-CM | POA: Diagnosis not present

## 2022-01-17 ENCOUNTER — Encounter: Payer: Self-pay | Admitting: Cardiology

## 2022-01-17 ENCOUNTER — Ambulatory Visit (INDEPENDENT_AMBULATORY_CARE_PROVIDER_SITE_OTHER): Payer: Medicare Other | Admitting: Cardiology

## 2022-01-17 VITALS — BP 140/70 | HR 61 | Ht 66.0 in | Wt 145.6 lb

## 2022-01-17 DIAGNOSIS — I739 Peripheral vascular disease, unspecified: Secondary | ICD-10-CM

## 2022-01-17 DIAGNOSIS — F17200 Nicotine dependence, unspecified, uncomplicated: Secondary | ICD-10-CM

## 2022-01-17 DIAGNOSIS — R0609 Other forms of dyspnea: Secondary | ICD-10-CM

## 2022-01-17 DIAGNOSIS — I208 Other forms of angina pectoris: Secondary | ICD-10-CM | POA: Diagnosis not present

## 2022-01-17 DIAGNOSIS — I25119 Atherosclerotic heart disease of native coronary artery with unspecified angina pectoris: Secondary | ICD-10-CM | POA: Diagnosis not present

## 2022-01-17 DIAGNOSIS — E785 Hyperlipidemia, unspecified: Secondary | ICD-10-CM | POA: Diagnosis not present

## 2022-01-17 NOTE — Progress Notes (Signed)
Primary Care Provider: Lawerance Cruel, MD Cardiologist: Glenetta Hew, MD Electrophysiologist: None  Clinic Note: Chief Complaint  Patient presents with   Follow-up    Test results-Myoview and Echo   Coronary Artery Disease    Still having exertional dyspnea-nonischemic Myoview with normal echo.   PAD    No claudication   ===================================  ASSESSMENT/PLAN   Problem List Items Addressed This Visit       Cardiology Problems   Coronary artery disease involving native coronary artery of native heart with angina pectoris ([place]) - Primary (Chronic)    Distant history of PCI.  Just had a Myoview stress test done with the intention of being a symptom limited stress test, however it was converted to [place] therefore not allowing me to see his exercise tolerance.  With relatively normal echocardiogram and normal Myoview, hard to consider macrovascular CAD as etiology.  Cannot titrate beta-blocker any further with a heart rate of 60 bpm.  The intention for this 50 mg stress test was to see exercise rate responsiveness, to evaluate for chronotropic competence.  Next evaluation would be to potentially consider simple GXT on beta-blocker to evaluate in response.  Otherwise, prefer to use beta-blocker with his CAD.  Is on stable dose of amlodipine 10 mg.  Unable to titrate Toprol any further because of resting heart rate and concern for chronotropic incompetence. Continue statin and aspirin. Continue ACE inhibitor for afterload reduction.Marland Kitchen      PAD (peripheral artery disease) (HCC) (Chronic)    No sensation of claudication.  Continue CV risk factor modification with GDMT Aspirin, statin, beta-blocker, ACE inhibitor and calcium channel blocker all at max or max tolerated doses.      Hyperlipidemia with target LDL less than 70 (Chronic)    Last labs were from December.  LDL looks pretty much at goal on atorvastatin 40 mg daily.  We will defer to PCP, but I  am a bit concerned with his memory loss.  May want to consider switching to a different statin -  consider rosuvastatin 20 mg daily.        Other   DOE (dyspnea on exertion)    Myoview and echo are pretty normal.  This was may be a solution an anginal equivalent although there did not appear to be some inferior defect.  If symptoms were to progress, Would probably prefer to perform GXT/CPX type of study first before invasive evaluation.  If test continue to be equivocal with continued symptoms, I would be inclined to consider cardiac catheterization which would allow [place] to evaluate CFR as well as RFR/FFR.  This would be a way to evaluate for microvascular as well as macrovascular disease;       Tobacco use disorder (Chronic)    Not smoking      ===================================  HPI:    Terry Kemp is a 76 y.o. male former smoker with a PMH notable for CAD-PCI as well as CVA/TIA, PAD HTN, and HLD who o is being seen today for follow-up evaluation of CHEST TIGHTNESS and DYSPNEA ON EXERTION at the request of Lawerance Cruel, MD.  1. 04/2005 -Inferior STEMI: CAD S/P RCA DES  - Negative Myoview March 2016  2. PAD - s/p Bilateral Iliac Stents (2000 & 2001) - Dr. Scot Dock.  3. Cerebrovascular accident (CVA) due to embolism of other precerebral artery (HCC) - also TIA; another CVA in @ 03/2019 => residual right-sided weakness/hemiparesis and significant short-term memory loss  4. Hyperlipidemia with target  LDL less than 70   5. Essential hypertension   6. Former heavy smoker - 38 Pk year.  Quit intermittently between 2017 and 2018, but finally quit in 2020  7. MDD - h/o attempted OD     Recent Hospitalizations/clinic visits:  Seen by Dr. Harrington Challenger on March 9: C/o Progressively worsening exertional dyspnea with minimal exertion-about 1 week history; per report, associated with chest tightness and wheezing.  Minimal improvement with albuterol. => SL NTG ordered &  referred back to  cardiology.  I last saw Terry Kemp on December 02, 2021 (for the first time since 2019 when he was relatively stable, try to get back and exercise.  No chest pain or pressure.Marland Kitchen)->  In follow-up, he was noted to be a very difficult historian (very difficult for him to remember timelines indurations).  Described having progressively worsening exertional dyspnea over the last 9 months.  1 episode of being very short of breath pushing a wheelbarrow.  He did feel some chest tightness worsening exercise dyspnea over the last 9 months.  Had 1 episode where he get very short of breath associate while pushing a wheelbarrow. He felt some chest tightness/discomfort-lasted about 10 minutes.  He felt very fatigued afterwards..   Myoview and 2D echo ordered.  Reviewed  CV studies:    The following studies were reviewed today: (if available, images/films reviewed: From Epic Chart or Care Everywhere) TTE 12/21/2021: Normal LV size and function.  EF 65 to 70%.  No RWMA.  GR 1 DD.  Normal RV size and function.  Unable to assess PAP.  Normal valves.  Normal RAP. Lexiscan Myoview 12/26/2021: Unfortunately, symptom limited stress test are not done here. EF hyperdynamic > 65%.  No ischemia or infarction.  LOW RISK.  Interval History:   Terry Kemp presents here today to review the results of his studies.  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  He is any basis to slow down more on the lawn or he does maybe half a lawn stops.  He gets short of breath but has not really had any more chest discomfort. He has gotten to where he is walking about half mile every day and doing relatively well.  He really has not had any further episodes of the chest discomfort or tightness.  Still very difficult historian.  He does have poor balance but no falls.  Generalized weakness and some dizziness.  No PND, orthopnea or edema.  No sense of  irregular heartbeats palpitations.  No tachycardic symptoms.  No syncope/near syncope or TIA/amaurosis fugax symptoms.  No claudication \ REVIEWED OF SYSTEMS   Review of Systems  Constitutional:  Positive for malaise/fatigue. Negative for weight loss.  HENT:  Negative for congestion and nosebleeds.   Respiratory:  Positive for shortness of breath (With moderate activity.). Negative for cough.   Cardiovascular:  Negative for leg swelling.  Gastrointestinal:  Negative for blood in stool and melena.  Genitourinary:  Negative for flank pain and hematuria.  Musculoskeletal:  Positive for joint pain. Negative for myalgias.  Neurological:  Positive for dizziness (Unsteady gait/poor balance.).  Psychiatric/Behavioral:  Positive for depression (Pretty well controlled.  Follows up with psychiatry.) and memory loss. Negative for hallucinations, substance abuse and suicidal ideas (None recently.). The patient is nervous/anxious and has insomnia.     I have reviewed and (if needed) personally updated the patient's problem list,  medications, allergies, past medical and surgical history, social and family history.   PAST MEDICAL HISTORY   Past Medical History:  Diagnosis Date   Anxiety    Arthritis    CAD S/P percutaneous coronary angioplasty 01/2005   ST elevation MI -CATH/PCI-RCA (Cypher DES 3 x 28 - 3.18m); CATH 2008 FOR INFERIOR ISCHEMIA - 80% dRCA ISR reduced to 40% with NTG - spasm.   COPD mixed type ([place]    Long-term smoker.  Managed by PCP.   Depression    He is on BuSpar and nightly Seroquel.   Hyperlipidemia    Hypertension    PAD (peripheral artery disease) ([place] 2000   bilateral iliac arteries PTA  and  stenting in 04/30/1998; In 2006-subsequent  R Common Iliac (RCIA) 100% ISR, 75 % LCIA -- staged Bifuracation Kissing Stent creating new Aortic Carina.-- Overlapping 7 mm x 339m& 1825mtents RCIA & kissing 7 mm x 29 mm in LCIA.   ST elevation myocardial infarction (STEMI) involving  other coronary artery of inferior wall ([facility]8/2006   RCA PCI   [facility]   a) 2013: Felt to be a TIA;; b) 08/24/2015: Imaging confirmed CVA - mild residual R-sided weakness   PAST SURGICAL HISTORY   Past Surgical History:  Procedure Laterality Date   ABDOMINAL AOTROGRAM  07/17/2007   ADDTION WITH CARDIAC CATH---(INFRARENAL WITH BIFEMORAL RUNOFF )----WIDELY PATENT STENTS TO LEFT AND RIGHT COMMON ILIACS   BACK SURGERY     metal plate   CARDIAC CATHETERIZATION  01/10/2005   Inf STEMI: dLM 50% - calcified. mRCA 100% (infaract-related vessel) --> DES PCI; AbdAo-Iliac Angio: PAD R Common Iliac (RCIA) 100% ISR, LCIA 75% ISR @ outflow tract of the left iliac. LVEF~ 60%   CARDIAC CATHETERIZATION  07/17/2007   40% dLM (previously read as 50%); 80% mRCA - distal stent ISR --> reduced to 40% with NTG = due to spasm--widely patent bilaeral common illiacs   CORONARY ANGIOPLASTY WITH STENT PLACEMENT  01/10/2005   mRCA DES PCI - CYPHER DES 3.0 x 28 mm -> post-dilated to 3.25 mm   EYE SURGERY Bilateral 2000   cataracts   lower extremities arterial doppler Bilateral 01/26/2005,09/28/2004   01/26/05-- right ABI -MODERATE,LEFT ABI MILLD -RIGHT CIA STENT OCCLUDED ,RIGHT IIA REVERSIBLE FLOW;LEFT CIA >70%--09/28/2004- RGT ABI'S 76, LFT ABI'S102   NM MYOCAR PERF EJECTION FRACTION  06/03/2007   REST/STRESS -- EF 69% MILD INFEROAPICAL ISCHEMIA NOTED - Referred for CATH - RCA PCI   NM MYOVIEW LTD  11/2014   Low Risk - "Diaphragmatic attenuation", no infarct or ischemia   NM MYOVIEW LTD  12/26/2021   Lexiscan: EF hyperdynamic > 65%.  No ischemia or infarction.  LOW RISK.   peripheral abd aortic cath  04/04/2005   performed by Dr WeiRollene Fare  LCIA 75% ISR, 100% RCIA ISR: kissing balloon PTA  --> Kissing STENTs Bilateral CIA (RCIA 2 overlapping 7.0 x 39 mm & 7.0 x 18 mm Gensis stents, LCIA - 7mm21m29 mm Gensis stent (noted to be patent in 2008 Cath)   peripheral arteries disease  09/1999   status post  bilatera iliac stents jan 2001. three stents in the left common iliasc ,1 widely patent stent right common iliac, 2 overlapping stents revealed nio more 10% in-stent restenosis in 2008.    SPINAL FUSION  11/2014   SURGERY BY DR ELSNEllene RouteRANSTHORACIC ECHOCARDIOGRAM  09/2015   Normal LV size and systolic function, EF 60-663-01%rmal RV size.  No  valvular lesions.   TRANSTHORACIC ECHOCARDIOGRAM  12/21/2021   Normal LV size and function.  EF 65 to 70%.  No RWMA.  GR 1 DD.  Normal RV size and function.  Unable to assess PAP.  Normal valves.  Normal RAP.     There is no immunization history on file for this patient.  MEDICATIONS/ALLERGIES   Current Meds  Medication Sig   albuterol (VENTOLIN HFA) 108 (90 Base) MCG/ACT inhaler 2 puffs as needed   amLODipine (NORVASC) 10 MG tablet TAKE 1 TABLET BY MOUTH ONCE DAILY (Patient taking differently: Take 10 mg by mouth daily.)   aspirin EC 81 MG tablet Take 1 tablet (81 mg total) by mouth daily. Swallow whole.   atorvastatin (LIPITOR) 40 MG tablet Take 1 tablet by mouth once daily   B Complex CAPS 1 tablet   busPIRone (BUSPAR) 10 MG tablet 1 tablet   Cyanocobalamin (VITAMIN B-12 CR PO) Take by mouth daily. Reported on 08/26/2015   lamoTRIgine (LAMICTAL) 25 MG tablet 1 tablet   metoprolol succinate (TOPROL XL) 25 MG 24 hr tablet Take 1 tablet (25 mg total) by mouth daily.   Multiple Vitamin (MULTIVITAMIN) tablet Take 1 tablet by mouth daily.   nicotine polacrilex (COMMIT) 2 MG lozenge Take 2 mg by mouth as needed for smoking cessation. Reports takeing12 per day   nitroGLYCERIN (NITROSTAT) 0.4 MG SL tablet Place under the tongue.   QUEtiapine (SEROQUEL) 100 MG tablet TAKE ONE-HALF TABLET BY MOUTH EVERY EVENING   ramipril (ALTACE) 10 MG capsule TAKE 1 CAPSULE BY MOUTH DAILY (Patient taking differently: Take 10 mg by mouth daily.)     Allergies  Allergen Reactions   Zoloft [Sertraline Hcl] Other (See Comments)    Psychosis per patient.    SOCIAL  HISTORY/FAMILY HISTORY   Reviewed in Epic:  Pertinent findings:  Social History   Tobacco Use   Smoking status: Former    Packs/day: 1.00    Years: 38.00    Total pack years: 38.00    Types: Cigarettes    Quit date: 01/01/2017    Years since quitting: 5.1   Smokeless tobacco: Never   Tobacco comments:    He quit in 2017, restarted then went back to smoking again.  Again quit in 2018 and then finally quit progression in 2020.  No longer using Nicorette pills.  Vaping Use   Vaping Use: Never used  Substance Use Topics   Alcohol use: Yes    Alcohol/week: 14.0 standard drinks of alcohol    Types: 14 Glasses of wine per week    Comment: Usually drinks about two 6 ounce glasses of wine a night.   Drug use: No   Social History   Social History Narrative   Following his most recent stroke, he no longer had the ability to manage himself in the home alone.  He did not trust himself to try to manage his finances, most notably his online trading.  Therefore turned his house over to his son and went back in with his ex-wife in 2020.      Lives with ex-wife   Right handed   Drinks 2-3 cups caffeine daily      Diet includes monitor servings of fruits and vegetables a day.  32 ounces water beginning a day.      Usually tries to exercise by walking about half a mile but every day.  Less active doing yard work.   He had a significant psychiatric event in August 2019 requiring  admission to the hospital.  He subsequently moved back in with his ex-wife, turning the house over to his son.  He also quit going to the [facility] because of concerns for psychiatric issues.  Follows up closely with psychiatry. => November 20-26, 2022: Xanax overdose => required intubation for airway management.  Stabilized and discharged to behavioral health.  OBJCTIVE -PE, EKG, labs   Wt Readings from Last 3 Encounters:  01/17/22 145 lb 9.6 oz (66 kg)  12/21/21 149 lb (67.6 kg)  12/02/21 149 lb 3.2 oz (67.7 kg)     Physical Exam: BP 140/70 (BP Location: Left Arm)   Pulse 61   Ht '5\' 6"'$  (1.676 m)   Wt 145 lb 9.6 oz (66 kg)   SpO2 94%   BMI 23.50 kg/m  Physical Exam Vitals reviewed.  Constitutional:      General: He is not in acute distress.    Appearance: Normal appearance. He is normal weight. He is not ill-appearing or toxic-appearing.  HENT:     Head: Normocephalic and atraumatic.  Neck:     Vascular: No carotid bruit, hepatojugular reflux or JVD.  Cardiovascular:     Rate and Rhythm: Regular rhythm. No extrasystoles are present.    Chest Wall: PMI is not displaced.     Pulses: Intact distal pulses.     Heart sounds: S1 normal and S2 normal. No murmur heard.    No friction rub. Gallop present. S4 sounds present.  Pulmonary:     Effort: Pulmonary effort is normal. No respiratory distress.     Breath sounds: Normal breath sounds. No wheezing, rhonchi or rales.  Chest:     Chest wall: No tenderness.  Abdominal:     General: Abdomen is flat. Bowel sounds are normal. There is no distension.     Palpations: Abdomen is soft. There is no mass (HSM or bruit.).     Tenderness: There is no abdominal tenderness. There is no guarding.  Musculoskeletal:        General: No swelling or signs of injury. Normal range of motion.     Cervical back: Normal range of motion and neck supple.  Skin:    General: Skin is warm and dry.     Coloration: Skin is not jaundiced or pale.  Neurological:     General: No focal deficit present.     Mental Status: He is alert and oriented to person, place, and time.     Gait: Gait normal.  Psychiatric:        Behavior: Behavior normal.        Judgment: Judgment normal.     Comments: Somewhat tangential speech.  Also repetitive.  Stable but somewhat depressed mood     Adult ECG Report Not checked  Recent Labs: 08/25/2021-reviewed from PCP Na+ 136, K+ 4.6, Cl- 99, HCO3-31, BUN 13, Cr 0.93, Glu 100, Ca2+ 9.3; AST 19, ALT 32, AlkP 86  TC 123, TG 118, HDL 37,  LDL 65 01/05/2021: TSH 1.729   Lab Results  Component Value Date   CREATININE 0.72 07/27/2021   BUN 6 (L) 07/27/2021   NA 134 (L) 07/27/2021   K 3.5 07/27/2021   CL 104 07/27/2021   CO2 22 07/27/2021      Latest Ref Rng & Units 07/26/2021    1:48 AM 07/25/2021    4:44 AM 07/24/2021    3:17 PM  CBC  WBC 4.0 - 10.5 K/uL 9.0  8.4    Hemoglobin 13.0 - 17.0 g/dL  13.6  14.2  12.9   Hematocrit 39.0 - 52.0 % 38.1  39.7  38.0   Platelets 150 - 400 K/uL 183  195     ==================================================  COVID-19 Education: The signs and symptoms of COVID-19 were discussed with the patient and how to seek care for testing (follow up with PCP or arrange E-visit).    I spent a total of 17 minutes with the patient spent in direct patient consultation.  Additional time spent with chart review  / charting (studies, outside notes, etc): 19  Total Time: 36 min  Current medicines are reviewed at length with the patient today.  (+/- concerns) N/A  Notice: This dictation was prepared with Dragon dictation along with smart phrase technology. Any transcriptional errors that result from this process are unintentional and may not be corrected upon review.  Studies Ordered:   No orders of the defined types were placed in this encounter.   Patient Instructions / Medication Changes & Studies & Tests Ordered   Patient Instructions  Medication Instructions:   No changes *If you need a refill on your cardiac medications before your next appointment, please call your pharmacy*   Lab Work: Not needed    Testing/Procedures:  Not needed  Follow-Up: At [facility], you and your health needs are our priority.  As part of our continuing mission to provide you with exceptional heart care, we have created designated Provider Care Teams.  These Care Teams include your primary Cardiologist (physician) and Advanced Practice Providers (APPs -  Physician Assistants and Nurse  Practitioners) who all work together to provide you with the care you need, when you need it.     Your next appointment:   12 month(s)  The format for your next appointment:   In Person  Provider:   Glenetta Hew, MD    Other Instructions  Keep blood pressure log and take to your primary office visit   Glenetta Hew, M.D., M.S. Interventional Cardiologist   Pager # [phone] Phone # [phone] [address] [place], [place] [address]   Thank you for choosing Heartcare at [facility]!!

## 2022-01-17 NOTE — Patient Instructions (Addendum)
Medication Instructions:  ? ?No changes ?*If you need a refill on your cardiac medications before your next appointment, please call your pharmacy* ? ? ?Lab Work: ?Not needed ? ? ? ?Testing/Procedures: ? ?Not needed ? ?Follow-Up: ?At [facility], you and your health needs are our priority.  As part of our continuing mission to provide you with exceptional heart care, we have created designated Provider Care Teams.  These Care Teams include your primary Cardiologist (physician) and Advanced Practice Providers (APPs -  Physician Assistants and Nurse Practitioners) who all work together to provide you with the care you need, when you need it. ? ?  ? ?Your next appointment:   ?12 month(s) ? ?The format for your next appointment:   ?In Person ? ?Provider:   ?Glenetta Hew, MD  ? ? ?Other Instructions  ?Keep blood pressure log and take to your primary office visit ?

## 2022-01-23 DIAGNOSIS — R972 Elevated prostate specific antigen [PSA]: Secondary | ICD-10-CM | POA: Diagnosis not present

## 2022-01-23 DIAGNOSIS — J449 Chronic obstructive pulmonary disease, unspecified: Secondary | ICD-10-CM | POA: Diagnosis not present

## 2022-01-23 DIAGNOSIS — N644 Mastodynia: Secondary | ICD-10-CM | POA: Diagnosis not present

## 2022-02-12 ENCOUNTER — Encounter: Payer: Self-pay | Admitting: Cardiology

## 2022-02-12 NOTE — Assessment & Plan Note (Signed)
Distant history of PCI.  Just had a Myoview stress test done with the intention of being a symptom limited stress test, however it was converted to [place] therefore not allowing me to see his exercise tolerance.  With relatively normal echocardiogram and normal Myoview, hard to consider macrovascular CAD as etiology.  Cannot titrate beta-blocker any further with a heart rate of 60 bpm.  The intention for this 50 mg stress test was to see exercise rate responsiveness, to evaluate for chronotropic competence.   Next evaluation would be to potentially consider simple GXT on beta-blocker to evaluate in response.  Otherwise, prefer to use beta-blocker with his CAD.   Is on stable dose of amlodipine 10 mg.  Unable to titrate Toprol any further because of resting heart rate and concern for chronotropic incompetence.  Continue statin and aspirin.  Continue ACE inhibitor for afterload reduction.Marland Kitchen

## 2022-02-12 NOTE — Assessment & Plan Note (Signed)
Not smoking.  

## 2022-02-12 NOTE — Assessment & Plan Note (Addendum)
Last labs were from December.  LDL looks pretty much at goal on atorvastatin 40 mg daily.  We will defer to PCP, but I am a bit concerned with his memory loss.  May want to consider switching to a different statin -  consider rosuvastatin 20 mg daily.

## 2022-02-12 NOTE — Assessment & Plan Note (Addendum)
No sensation of claudication.  Continue CV risk factor modification with GDMT  Aspirin, statin, beta-blocker, ACE inhibitor and calcium channel blocker all at max or max tolerated doses.

## 2022-02-12 NOTE — Assessment & Plan Note (Addendum)
Myoview and echo are pretty normal.  This was may be a solution an anginal equivalent although there did not appear to be some inferior defect.  If symptoms were to progress, Would probably prefer to perform GXT/CPX type of study first before invasive evaluation.  If test continue to be equivocal with continued symptoms, I would be inclined to consider cardiac catheterization which would allow [place] to evaluate CFR as well as RFR/FFR.  This would be a way to evaluate for microvascular as well as macrovascular disease;

## 2022-03-24 ENCOUNTER — Telehealth: Payer: Self-pay

## 2022-03-24 NOTE — Telephone Encounter (Unsigned)
   Pre-operative Risk Assessment    Patient Name: Terry Kemp  DOB: 1945/09/30 MRN: [id number]{ HEARTCARE STAFF-IMPORTANT INSTRUCTIONS 1 Red and Blue Text will auto delete once note is signed or closed. 2 Press F2 to navigate through template.   3 On drop down lists, L click to select >> R click to activate next field 4 Reason for Visit format is IMPORTANT!!  See Directions on No. 2 below. 5 Please review chart to determine if there is already a clearance note open for this procedure!!  DO NOT duplicate if a note already exists!!    :1}      Request for Surgical Clearance{ 1. What type of surgery is being performed? Enter name of procedure below and number of teeth if dental extraction.  :1}    Procedure:   Colonoscopy { 2. When is this surgery scheduled? Press F2 to enter date below and place date in Reason for Visit (see directions below). :1} Date of Surgery:  Clearance 06/06/22                             { For convenience, highlight and copy (CTL+C) the Clearance MM/DD/YY phrase above. Click here to go to Reason for Visit.  Paste (CTL+V) the date.  Merchandiser, retail.  Then click button underneath called Add Clearance MM/DD/YY as free text.     :1}  { 3. What is the name of the Surgeon, the Surgeon's Group or Practice, phone and fax number?  Press F2 and list below :1}  Surgeon:   Surgeon's Group or Practice Name:  [facility]  Phone number:  [phone] Fax number:  [phone] { 4. What type of clearance is requested?  Medical or Cardiac Clearance only?  Pharmacy Clearance Only (Request is to hold medication only)?  Or Both?  Press F2 and select the clearance requested.  If both are needed, select both from the drop down list.     :1}  Type of Clearance Requested:   - Medical  { 5. What type of anesthesia will be used?  Press F2 and select the anesthesia to be used for the procedure.  :1}  Type of Anesthesia:  Not Indicated { 6. Are there any other requests or  questions from the surgeon?    :1}  Additional requests/questions:  {Select additional requests/questions or use asterisks to free text (Optional):[id number]}  Signed, Jacqulynn Cadet   03/24/2022, 4:04 PM

## 2022-03-27 NOTE — Telephone Encounter (Signed)
   Name: Terry Kemp  DOB: 12-24-1945  MRN: [id number]  Primary Cardiologist: Glenetta Hew, MD   Preoperative team, please contact this patient and set up a phone call appointment on or after 04/13/2022 for further preoperative risk assessment. Please obtain consent and complete medication review. Thank you for your help.  I confirm that guidance regarding antiplatelet and oral anticoagulation therapy has been completed and, if necessary, noted below.  No medication hold request.   [name], NP 03/27/2022, 12:31 PM [place] [address] [place], [place] [address]

## 2022-03-29 NOTE — Telephone Encounter (Signed)
Left message for the pt to call back for tele appt sometime end of August or in Sept

## 2022-03-29 NOTE — Telephone Encounter (Signed)
Patient was returning call. Please advise ?

## 2022-03-30 NOTE — Telephone Encounter (Signed)
Left message x  2 for tele pre op appt

## 2022-03-31 NOTE — Telephone Encounter (Signed)
3rd and final attempt to reach pt regarding surgical clearance, left another message for pt to call back.  Will route back to requesting surgeon's office to have pt contact the office.

## 2022-04-05 ENCOUNTER — Telehealth: Payer: Self-pay | Admitting: Cardiology

## 2022-04-05 NOTE — Telephone Encounter (Signed)
Call placed to pt again, had to leave another message for pt to call back.  Call placed to [facility] and let them know that we have contacted pt again, left another message.  [facility] can try to reach pt and have pt call our office back to schedule the appointment, as this encounter will be closed, after numerous attempts.

## 2022-04-05 NOTE — Telephone Encounter (Signed)
Office calling asking that we reach out to the patient again, to get his appt setup. Please advise

## 2022-04-05 NOTE — Telephone Encounter (Signed)
Was returning call stated might be at lunch when called back. Ask if could call back 2pm. Please advise

## 2022-04-06 ENCOUNTER — Telehealth: Payer: Self-pay | Admitting: *Deleted

## 2022-04-06 NOTE — Telephone Encounter (Signed)
Pt called today and has been scheduled for tele pre op appt 05/15/22 @ 9 am. Med rec and consent are done.

## 2022-04-06 NOTE — Telephone Encounter (Signed)
Pt called today and has been scheduled for tele pre op appt 05/15/22 @ 9 am. Med rec and consent are done.      Patient Consent for Virtual Visit        Terry Kemp has provided verbal consent on 04/06/2022 for a virtual visit (video or telephone).   CONSENT FOR VIRTUAL VISIT FOR:  Terry Kemp  By participating in this virtual visit I agree to the following:  I hereby voluntarily request, consent and authorize [facility] and its employed or contracted physicians, physician assistants, nurse practitioners or other licensed health care professionals (the Practitioner), to provide me with telemedicine health care services (the "Services") as deemed necessary by the treating Practitioner. I acknowledge and consent to receive the Services by the Practitioner via telemedicine. I understand that the telemedicine visit will involve communicating with the Practitioner through live audiovisual communication technology and the disclosure of certain medical information by electronic transmission. I acknowledge that I have been given the opportunity to request an in-person assessment or other available alternative prior to the telemedicine visit and am voluntarily participating in the telemedicine visit.  I understand that I have the right to withhold or withdraw my consent to the use of telemedicine in the course of my care at any time, without affecting my right to future care or treatment, and that the Practitioner or I may terminate the telemedicine visit at any time. I understand that I have the right to inspect all information obtained and/or recorded in the course of the telemedicine visit and may receive copies of available information for a reasonable fee.  I understand that some of the potential risks of receiving the Services via telemedicine include:  Delay or interruption in medical evaluation due to technological equipment failure or disruption; Information transmitted may not be  sufficient (e.g. poor resolution of images) to allow for appropriate medical decision making by the Practitioner; and/or  In rare instances, security protocols could fail, causing a breach of personal health information.  Furthermore, I acknowledge that it is my responsibility to provide information about my medical history, conditions and care that is complete and accurate to the best of my ability. I acknowledge that Practitioner's advice, recommendations, and/or decision may be based on factors not within their control, such as incomplete or inaccurate data provided by me or distortions of diagnostic images or specimens that may result from electronic transmissions. I understand that the practice of medicine is not an exact science and that Practitioner makes no warranties or guarantees regarding treatment outcomes. I acknowledge that a copy of this consent can be made available to me via my patient portal ([facility]), or I can request a printed copy by calling the office of [facility].    I understand that my insurance will be billed for this visit.   I have read or had this consent read to me. I understand the contents of this consent, which adequately explains the benefits and risks of the Services being provided via telemedicine.  I have been provided ample opportunity to ask questions regarding this consent and the Services and have had my questions answered to my satisfaction. I give my informed consent for the services to be provided through the use of telemedicine in my medical care

## 2022-04-06 NOTE — Telephone Encounter (Signed)
Pt has been scheduled already

## 2022-05-15 ENCOUNTER — Ambulatory Visit: Payer: Medicare Other | Attending: Cardiology | Admitting: Physician Assistant

## 2022-05-15 DIAGNOSIS — Z01810 Encounter for preprocedural cardiovascular examination: Secondary | ICD-10-CM | POA: Insufficient documentation

## 2022-05-15 NOTE — Progress Notes (Signed)
Left message for pt to return call to the pre op team to reschedule pre op tele appt.

## 2022-05-15 NOTE — Progress Notes (Signed)
Virtual Visit via Telephone Note   Because of Terry Kemp's co-morbid illnesses, he is at least at moderate risk for complications without adequate follow up.  This format is felt to be most appropriate for this patient at this time.  The patient did not have access to video technology/had technical difficulties with video requiring transitioning to audio format only (telephone).  All issues noted in this document were discussed and addressed.  No physical exam could be performed with this format.  Please refer to the patient's chart for his consent to telehealth for [facility].  Evaluation Performed:  Preoperative cardiovascular risk assessment _____________   Date:  05/15/2022   Patient ID:  Terry Kemp, DOB 1946-02-03, MRN [id number] Patient Location:  Home Provider location:   Office  Primary Care Provider:  Lawerance Cruel, MD Primary Cardiologist:  Terry Hew, MD  Chief Complaint / Patient Profile   76 y.o. y/o male with a h/o CAD status post PCI, CVA/TIA, PAD, hypertension, and hyperlipidemia who is pending colonoscopy and presents today for telephonic preoperative cardiovascular risk assessment.  Past Medical History    Past Medical History:  Diagnosis Date   Anxiety    Arthritis    CAD S/P percutaneous coronary angioplasty 01/2005   ST elevation MI -CATH/PCI-RCA (Cypher DES 3 x 28 - 3.60m); CATH 2008 FOR INFERIOR ISCHEMIA - 80% dRCA ISR reduced to 40% with NTG - spasm.   COPD mixed type ([place]    Long-term smoker.  Managed by PCP.   Depression    He is on BuSpar and nightly Seroquel.   Hyperlipidemia    Hypertension    PAD [place]ipheral artery disease) [place]   bilateral iliac arteries PTA  and  stenting in 04/30/1998; In 2006-subsequent  R Common Iliac (RCIA) 100% ISR, 75 % LCIA -- staged Bifuracation Kissing Stent creating new Aortic Carina.-- Overlapping 7 mm x 369m& 1893mtents RCIA & kissing 7 mm x 29 mm in LCIA.   ST elevation  myocardial infarction (STEMI) involving other coronary artery of inferior wall ([place]   RCA PCI   [facility]   a) 2013: Felt to be a TIA;; b) 08/24/2015: Imaging confirmed CVA - mild residual R-sided weakness   Past Surgical History:  Procedure Laterality Date   ABDOMINAL AOTROGRAM  07/17/2007   ADDTION WITH CARDIAC CATH---(INFRARENAL WITH BIFEMORAL RUNOFF )----WIDELY PATENT STENTS TO LEFT AND RIGHT COMMON ILIACS   BACK SURGERY     metal plate   CARDIAC CATHETERIZATION  01/10/2005   Inf STEMI: dLM 50% - calcified. mRCA 100% (infaract-related vessel) --> DES PCI; AbdAo-Iliac Angio: PAD R Common Iliac (RCIA) 100% ISR, LCIA 75% ISR @ outflow tract of the left iliac. LVEF~ 60%   CARDIAC CATHETERIZATION  07/17/2007   40% dLM (previously read as 50%); 80% mRCA - distal stent ISR --> reduced to 40% with NTG = due to spasm--widely patent bilaeral common illiacs   CORONARY ANGIOPLASTY WITH STENT PLACEMENT  01/10/2005   mRCA DES PCI - CYPHER DES 3.0 x 28 mm -> post-dilated to 3.25 mm   EYE SURGERY Bilateral 2000   cataracts   lower extremities arterial doppler Bilateral 01/26/2005,09/28/2004   01/26/05-- right ABI -MODERATE,LEFT ABI MILLD -RIGHT CIA STENT OCCLUDED ,RIGHT IIA REVERSIBLE FLOW;LEFT CIA >70%--09/28/2004- RGT ABI'S 76, LFT ABI'S102   NM MYOCAR PERF EJECTION FRACTION  06/03/2007   REST/STRESS -- EF 69% MILD INFEROAPICAL ISCHEMIA NOTED - Referred for CATH - RCA PCI   NM  MYOVIEW LTD  11/2014   Low Risk - "Diaphragmatic attenuation", no infarct or ischemia   NM MYOVIEW LTD  12/26/2021   Lexiscan: EF hyperdynamic > 65%.  No ischemia or infarction.  LOW RISK.   peripheral abd aortic cath  04/04/2005   performed by Dr Rollene Fare---  LCIA 75% ISR, 100% RCIA ISR: kissing balloon PTA  --> Kissing STENTs Bilateral CIA (RCIA 2 overlapping 7.0 x 39 mm & 7.0 x 18 mm Gensis stents, LCIA - 51m x 29 mm Gensis stent (noted to be patent in 2008 Cath)   peripheral arteries disease  09/1999    status post bilatera iliac stents jan 2001. three stents in the left common iliasc ,1 widely patent stent right common iliac, 2 overlapping stents revealed nio more 10% in-stent restenosis in 2008.    SPINAL FUSION  11/2014   SURGERY BY DR EEllene Route  TRANSTHORACIC ECHOCARDIOGRAM  09/2015   Normal LV size and systolic function, EF 656-21% Normal RV size.  No valvular lesions.   TRANSTHORACIC ECHOCARDIOGRAM  12/21/2021   Normal LV size and function.  EF 65 to 70%.  No RWMA.  GR 1 DD.  Normal RV size and function.  Unable to assess PAP.  Normal valves.  Normal RAP.    Allergies  Allergies  Allergen Reactions   Zoloft [Sertraline Hcl] Other (See Comments)    Psychosis per patient.    History of Present Illness    Terry FRIEDLANDis a 76y.o. male who presents via audio/video conferencing for a telehealth visit today.  Pt was last seen in cardiology clinic on 12/02/2021 by Dr. HEllyn Kemp  At that time Terry ALENwas having some exertional dyspnea and a Myoview stress test was ordered.  Stress test showed normal pump function of 65 to 70%.  No wall motion.  No evidence of ischemia or infarction.  The patient is now pending procedure as outlined above. Since his last visit, he   Patient did not answer x 2 and will need to reschedule  No medications are indicated as needing held. Home Medications    Prior to Admission medications   Medication Sig Start Date End Date Taking? Authorizing Provider  albuterol (VENTOLIN HFA) 108 (90 Base) MCG/ACT inhaler 2 puffs as needed 08/25/21   [provider]  amLODipine (NORVASC) 10 MG tablet TAKE 1 TABLET BY MOUTH ONCE DAILY Patient taking differently: Take 10 mg by mouth daily. 09/25/18   HLeonie Man MD  aspirin EC 81 MG tablet Take 1 tablet (81 mg total) by mouth daily. Swallow whole. 10/26/20   SGarvin Fila MD  atorvastatin (LIPITOR) 40 MG tablet Take 1 tablet by mouth once daily 09/06/20   HLeonie Man MD  B Complex CAPS 1  tablet    [provider]  busPIRone (BUSPAR) 10 MG tablet 1 tablet 08/11/21   [provider]  Cyanocobalamin (VITAMIN B-12 CR PO) Take by mouth daily. Reported on 08/26/2015    [provider]  lamoTRIgine (LAMICTAL) 25 MG tablet 1 tablet 08/11/21   [provider]  metoprolol succinate (TOPROL XL) 25 MG 24 hr tablet Take 1 tablet (25 mg total) by mouth daily. 06/22/17   HLeonie Man MD  Multiple Vitamin (MULTIVITAMIN) tablet Take 1 tablet by mouth daily.    [provider]  nicotine polacrilex (COMMIT) 2 MG lozenge Take 2 mg by mouth as needed for smoking cessation. Reports takeing12 per day    [provider]  nitroGLYCERIN (NITROSTAT) 0.4 MG SL tablet Place under the tongue. 11/10/21   [provider]  QUEtiapine (SEROQUEL) 100 MG tablet TAKE ONE-HALF TABLET BY MOUTH EVERY EVENING 11/04/21   [provider]  ramipril (ALTACE) 10 MG capsule TAKE 1 CAPSULE BY MOUTH DAILY Patient taking differently: Take 10 mg by mouth daily. 05/14/19   Leonie Man, MD    Physical Exam    Vital Signs:  Clemon Chambers does not have vital signs available for review today.  Given telephonic nature of communication, physical exam is limited. AAOx3. NAD. Normal affect.  Speech and respirations are unlabored.  Accessory Clinical Findings    None  Assessment & Plan    1.  Preoperative Cardiovascular Risk Assessment:     A copy of this note will be routed to requesting surgeon.  Time:   Today, I have spent  minutes with the patient with telehealth technology discussing medical history, symptoms, and management plan.     Elgie Collard, PA-C  05/15/2022, 8:42 AM

## 2022-05-17 ENCOUNTER — Telehealth: Payer: Self-pay | Admitting: Cardiology

## 2022-05-17 NOTE — Telephone Encounter (Signed)
Patient calling because he missed his tele visit on yesterday. Was calling to get it reschedule. Please advise

## 2022-05-17 NOTE — Telephone Encounter (Signed)
Pt has been rescheduled to 05/30/22 for tele visit pre op clearance.

## 2022-05-29 NOTE — Progress Notes (Unsigned)
Virtual Visit via Telephone Note   Because of Terry Kemp's co-morbid illnesses, he is at least at moderate risk for complications without adequate follow up.  This format is felt to be most appropriate for this patient at this time.  The patient did not have access to video technology/had technical difficulties with video requiring transitioning to audio format only (telephone).  All issues noted in this document were discussed and addressed.  No physical exam could be performed with this format.  Please refer to the patient's chart for his consent to telehealth for [facility].    Date:  05/30/2022   ID:  Terry Kemp, DOB [birth year], MRN [id number] The patient was identified using 2 identifiers.  Patient Location: Home Provider Location: Home Office   PCP:  Lawerance Cruel, [facility] Cardiologist:  Glenetta Hew, MD     Evaluation Performed:  Follow-Up Visit  Chief Complaint: Preoperative clearance  History of Present Illness:    Terry Kemp is a 76 y.o. male with CAD, PAD, hyperlipidemia, tobacco use, CVA/TIA.  August 2006 had inferior STEMI with DES-RCA.  Negative Myoview 11/2014.  Most recent Myoview 12/2021 EF 65 to 70%, no wall motion, no evidence of ischemia or infarction.  Low risk study. Last seen 01/17/22 by Dr. Ellyn Hack with reassurance provided regarding myoview and echocardiogram. However, if continued symptoms plan to consider GXT/CPX while on Metoprolol prior to [facility].   He has colonoscopy upcoming 06/06/22. Phone visit today for clearance.   Reports no shortness of breath and stable dyspnea on exertion. Reports no chest pain, pressure, or tightness. No edema, orthopnea, PND. Reports no palpitations.  Active around his home push mowing lawn. Reports blood pressure has been good when checked at home most often 130s.    Past Medical History:  Diagnosis Date   Anxiety    Arthritis    CAD S/P percutaneous  coronary angioplasty 01/2005   ST elevation MI -CATH/PCI-RCA (Cypher DES 3 x 28 - 3.57m); CATH 2008 FOR INFERIOR ISCHEMIA - 80% dRCA ISR reduced to 40% with NTG - spasm.   COPD mixed type ([place]    Long-term smoker.  Managed by PCP.   Depression    He is on BuSpar and nightly Seroquel.   Hyperlipidemia    Hypertension    PAD [place]ipheral artery disease) [place]   bilateral iliac arteries PTA  and  stenting in 04/30/1998; In 2006-subsequent  R Common Iliac (RCIA) 100% ISR, 75 % LCIA -- staged Bifuracation Kissing Stent creating new Aortic Carina.-- Overlapping 7 mm x 327m& 1862mtents RCIA & kissing 7 mm x 29 mm in LCIA.   ST elevation myocardial infarction (STEMI) involving other coronary artery of inferior wall ([place]CC[place]8/2006   RCA PCI   [facility]   a[facility] 2013: Felt to be a TIA;; b) 08/24/2015: Imaging confirmed CVA - mild residual R-sided weakness   Past Surgical History:  Procedure Laterality Date   ABDOMINAL AOTROGRAM  07/17/2007   ADDTION WITH CARDIAC CATH---(INFRARENAL WITH BIFEMORAL RUNOFF )----WIDELY PATENT STENTS TO LEFT AND RIGHT COMMON ILIACS   BACK SURGERY     metal plate   CARDIAC CATHETERIZATION  01/10/2005   Inf STEMI: dLM 50% - calcified. mRCA 100% (infaract-related vessel) --> DES PCI; AbdAo-Iliac Angio: PAD R Common Iliac (RCIA) 100% ISR, LCIA 75% ISR @ outflow tract of the left iliac. LVEF~ 60%   CARDIAC CATHETERIZATION  07/17/2007   40% dLM (previously read  as 50%); 80% mRCA - distal stent ISR --> reduced to 40% with NTG = due to spasm--widely patent bilaeral common illiacs   CORONARY ANGIOPLASTY WITH STENT PLACEMENT  01/10/2005   mRCA DES PCI - CYPHER DES 3.0 x 28 mm -> post-dilated to 3.25 mm   EYE SURGERY Bilateral 2000   cataracts   lower extremities arterial doppler Bilateral 01/26/2005,09/28/2004   01/26/05-- right ABI -MODERATE,LEFT ABI MILLD -RIGHT CIA STENT OCCLUDED ,RIGHT IIA REVERSIBLE FLOW;LEFT CIA >70%--09/28/2004- RGT ABI'S 76, LFT ABI'S102   NM  MYOCAR PERF EJECTION FRACTION  06/03/2007   REST/STRESS -- EF 69% MILD INFEROAPICAL ISCHEMIA NOTED - Referred for CATH - RCA PCI   NM MYOVIEW LTD  11/2014   Low Risk - "Diaphragmatic attenuation", no infarct or ischemia   NM MYOVIEW LTD  12/26/2021   Lexiscan: EF hyperdynamic > 65%.  No ischemia or infarction.  LOW RISK.   peripheral abd aortic cath  04/04/2005   performed by Dr Rollene Fare---  LCIA 75% ISR, 100% RCIA ISR: kissing balloon PTA  --> Kissing STENTs Bilateral CIA (RCIA 2 overlapping 7.0 x 39 mm & 7.0 x 18 mm Gensis stents, LCIA - 36m x 29 mm Gensis stent (noted to be patent in 2008 Cath)   peripheral arteries disease  09/1999   status post bilatera iliac stents jan 2001. three stents in the left common iliasc ,1 widely patent stent right common iliac, 2 overlapping stents revealed nio more 10% in-stent restenosis in 2008.    SPINAL FUSION  11/2014   SURGERY BY DR EEllene Route  TRANSTHORACIC ECHOCARDIOGRAM  09/2015   Normal LV size and systolic function, EF 633-82% Normal RV size.  No valvular lesions.   TRANSTHORACIC ECHOCARDIOGRAM  12/21/2021   Normal LV size and function.  EF 65 to 70%.  No RWMA.  GR 1 DD.  Normal RV size and function.  Unable to assess PAP.  Normal valves.  Normal RAP.     Current Meds  Medication Sig   albuterol (VENTOLIN HFA) 108 (90 Base) MCG/ACT inhaler 2 puffs as needed   amLODipine (NORVASC) 10 MG tablet TAKE 1 TABLET BY MOUTH ONCE DAILY (Patient taking differently: Take 10 mg by mouth daily.)   aspirin EC 81 MG tablet Take 1 tablet (81 mg total) by mouth daily. Swallow whole.   atorvastatin (LIPITOR) 40 MG tablet Take 1 tablet by mouth once daily   B Complex CAPS 1 tablet   busPIRone (BUSPAR) 10 MG tablet 1 tablet   Cyanocobalamin (VITAMIN B-12 CR PO) Take by mouth daily. Reported on 08/26/2015   lamoTRIgine (LAMICTAL) 25 MG tablet 1 tablet   metoprolol succinate (TOPROL XL) 25 MG 24 hr tablet Take 1 tablet (25 mg total) by mouth daily.   Multiple Vitamin  (MULTIVITAMIN) tablet Take 1 tablet by mouth daily.   nicotine polacrilex (COMMIT) 2 MG lozenge Take 2 mg by mouth as needed for smoking cessation. Reports takeing12 per day   nitroGLYCERIN (NITROSTAT) 0.4 MG SL tablet Place under the tongue.   QUEtiapine (SEROQUEL) 100 MG tablet TAKE ONE-HALF TABLET BY MOUTH EVERY EVENING   ramipril (ALTACE) 10 MG capsule TAKE 1 CAPSULE BY MOUTH DAILY (Patient taking differently: Take 10 mg by mouth daily.)     Allergies:   Zoloft [sertraline hcl]   Social History   Tobacco Use   Smoking status: Former    Packs/day: 1.00    Years: 38.00    Total pack years: 38.00    Types: Cigarettes  Quit date: 01/01/2017    Years since quitting: 5.4   Smokeless tobacco: Never   Tobacco comments:    He quit in 2017, restarted then went back to smoking again.  Again quit in 2018 and then finally quit progression in 2020.  No longer using Nicorette pills.  Vaping Use   Vaping Use: Never used  Substance Use Topics   Alcohol use: Yes    Alcohol/week: 14.0 standard drinks of alcohol    Types: 14 Glasses of wine per week    Comment: Usually drinks about two 6 ounce glasses of wine a night.   Drug use: No     Family Hx: The patient's family history includes Cancer in his mother; Heart disease in his maternal grandfather. He was adopted.  ROS:   Please see the history of present illness.     All other systems reviewed and are negative.   Prior CV studies:   The following studies were reviewed today:  Myoview 12/2001   The study is normal. The study is low risk.   No ST deviation was noted.   LV perfusion is normal.   Left ventricular function is normal. Nuclear stress EF: 68 %. The left ventricular ejection fraction is hyperdynamic (>65%). End diastolic cavity size is normal.   Prior study available for comparison from 11/16/2014.  Labs/Other Tests and Data Reviewed:    EKG:  No ECG reviewed.  Recent Labs: 07/26/2021: ALT 16; Hemoglobin 13.6;  Platelets 183 07/27/2021: BUN 6; Creatinine, Ser 0.72; Magnesium 1.9; Potassium 3.5; Sodium 134   Recent Lipid Panel Lab Results  Component Value Date/Time   CHOL 173 05/21/2015 12:09 PM   TRIG 154 (H) 07/25/2021 04:44 AM   HDL 50 05/21/2015 12:09 PM   CHOLHDL 3.5 05/21/2015 12:09 PM   LDLCALC 102 05/21/2015 12:09 PM    Wt Readings from Last 3 Encounters:  01/17/22 145 lb 9.6 oz (66 kg)  12/21/21 149 lb (67.6 kg)  12/02/21 149 lb 3.2 oz (67.7 kg)         Objective:    Vital Signs:  BP 135/76   Ht '5\' 6"'$  (1.676 m)   BMI 23.50 kg/m    VITAL SIGNS:  reviewed  ASSESSMENT & PLAN:    Preop - Pending colonoscopy. His Functional Capacity in METs is: 5.62 according to the Duke Activity Status Index (DASI). He is deemed acceptable risk for planned procedure without additional cardiovascular testing. Will route to surgical team so they are aware.   CAD - Stable with no anginal symptoms. No indication for ischemic evaluation.  Myoview 12/2021 low risk. GDMT aspirin, atorvastatin, metoprolol. Heart healthy diet and regular cardiovascular exercise encouraged.   PAD - No claudication. Encouraged continued walking. Continue Atorvastatin, aspirin.   HLD,LDL goal <70 -Continue Atorvastatin.   Tobacco use - Smoking cessation encouraged. Recommend utilization of 1800QUITNOW.       Time:   Today, I have spent 7 minutes with the patient with telehealth technology discussing the above problems.     Medication Adjustments/Labs and Tests Ordered: Current medicines are reviewed at length with the patient today.  Concerns regarding medicines are outlined above.   Tests Ordered: No orders of the defined types were placed in this encounter.   Medication Changes: No orders of the defined types were placed in this encounter.   Follow Up:  In Person  01/2023  Signed, Loel Dubonnet, NP  05/30/2022 9:37 AM    [place]

## 2022-05-30 ENCOUNTER — Telehealth (HOSPITAL_BASED_OUTPATIENT_CLINIC_OR_DEPARTMENT_OTHER): Payer: Self-pay | Admitting: Family

## 2022-05-30 ENCOUNTER — Ambulatory Visit (INDEPENDENT_AMBULATORY_CARE_PROVIDER_SITE_OTHER): Payer: Medicare Other | Admitting: Family

## 2022-05-30 ENCOUNTER — Encounter (HOSPITAL_BASED_OUTPATIENT_CLINIC_OR_DEPARTMENT_OTHER): Payer: Self-pay | Admitting: Family

## 2022-05-30 VITALS — BP 135/76 | Ht 66.0 in

## 2022-05-30 DIAGNOSIS — Z01810 Encounter for preprocedural cardiovascular examination: Secondary | ICD-10-CM | POA: Diagnosis not present

## 2022-05-30 NOTE — Telephone Encounter (Signed)
Pt returned call. See phone visit encounter 05/30/22.   Loel Dubonnet, NP

## 2022-05-30 NOTE — Telephone Encounter (Signed)
Called patient x 3 for telehealth visit for preoperative clearance. Left VM requesting call back. He was unable to be reached.   Loel Dubonnet, NP

## 2022-06-04 HISTORY — PX: COLONOSCOPY: SHX174

## 2022-07-20 DIAGNOSIS — Z86010 Personal history of colonic polyps: Secondary | ICD-10-CM | POA: Diagnosis not present

## 2022-07-20 DIAGNOSIS — D124 Benign neoplasm of descending colon: Secondary | ICD-10-CM | POA: Diagnosis not present

## 2022-07-20 DIAGNOSIS — D125 Benign neoplasm of sigmoid colon: Secondary | ICD-10-CM | POA: Diagnosis not present

## 2022-07-20 DIAGNOSIS — K648 Other hemorrhoids: Secondary | ICD-10-CM | POA: Diagnosis not present

## 2022-07-20 DIAGNOSIS — K5730 Diverticulosis of large intestine without perforation or abscess without bleeding: Secondary | ICD-10-CM | POA: Diagnosis not present

## 2022-07-20 DIAGNOSIS — Z09 Encounter for follow-up examination after completed treatment for conditions other than malignant neoplasm: Secondary | ICD-10-CM | POA: Diagnosis not present

## 2022-07-20 DIAGNOSIS — K635 Polyp of colon: Secondary | ICD-10-CM | POA: Diagnosis not present

## 2022-07-25 DIAGNOSIS — D124 Benign neoplasm of descending colon: Secondary | ICD-10-CM | POA: Diagnosis not present

## 2022-07-25 DIAGNOSIS — K635 Polyp of colon: Secondary | ICD-10-CM | POA: Diagnosis not present

## 2022-07-25 DIAGNOSIS — D125 Benign neoplasm of sigmoid colon: Secondary | ICD-10-CM | POA: Diagnosis not present

## 2022-08-25 DIAGNOSIS — G4700 Insomnia, unspecified: Secondary | ICD-10-CM | POA: Diagnosis not present

## 2022-08-25 DIAGNOSIS — F331 Major depressive disorder, recurrent, moderate: Secondary | ICD-10-CM | POA: Diagnosis not present

## 2022-08-25 DIAGNOSIS — J449 Chronic obstructive pulmonary disease, unspecified: Secondary | ICD-10-CM | POA: Diagnosis not present

## 2022-08-25 DIAGNOSIS — I10 Essential (primary) hypertension: Secondary | ICD-10-CM | POA: Diagnosis not present

## 2022-08-25 DIAGNOSIS — I2510 Atherosclerotic heart disease of native coronary artery without angina pectoris: Secondary | ICD-10-CM | POA: Diagnosis not present

## 2022-08-25 DIAGNOSIS — E7800 Pure hypercholesterolemia, unspecified: Secondary | ICD-10-CM | POA: Diagnosis not present

## 2022-08-31 DIAGNOSIS — Z6825 Body mass index (BMI) 25.0-25.9, adult: Secondary | ICD-10-CM | POA: Diagnosis not present

## 2022-08-31 DIAGNOSIS — Z0000 Encounter for general adult medical examination without abnormal findings: Secondary | ICD-10-CM | POA: Diagnosis not present

## 2022-09-06 DIAGNOSIS — F411 Generalized anxiety disorder: Secondary | ICD-10-CM | POA: Diagnosis not present

## 2022-09-06 DIAGNOSIS — F331 Major depressive disorder, recurrent, moderate: Secondary | ICD-10-CM | POA: Diagnosis not present

## 2022-10-02 DIAGNOSIS — M25559 Pain in unspecified hip: Secondary | ICD-10-CM | POA: Diagnosis not present

## 2022-10-02 DIAGNOSIS — Z6825 Body mass index (BMI) 25.0-25.9, adult: Secondary | ICD-10-CM | POA: Diagnosis not present

## 2022-10-02 DIAGNOSIS — I69359 Hemiplegia and hemiparesis following cerebral infarction affecting unspecified side: Secondary | ICD-10-CM | POA: Diagnosis not present

## 2022-10-02 DIAGNOSIS — Z125 Encounter for screening for malignant neoplasm of prostate: Secondary | ICD-10-CM | POA: Diagnosis not present

## 2022-10-02 DIAGNOSIS — E7800 Pure hypercholesterolemia, unspecified: Secondary | ICD-10-CM | POA: Diagnosis not present

## 2022-10-02 DIAGNOSIS — I10 Essential (primary) hypertension: Secondary | ICD-10-CM | POA: Diagnosis not present

## 2022-10-02 DIAGNOSIS — J449 Chronic obstructive pulmonary disease, unspecified: Secondary | ICD-10-CM | POA: Diagnosis not present

## 2022-10-02 DIAGNOSIS — I679 Cerebrovascular disease, unspecified: Secondary | ICD-10-CM | POA: Diagnosis not present

## 2022-10-02 DIAGNOSIS — E559 Vitamin D deficiency, unspecified: Secondary | ICD-10-CM | POA: Diagnosis not present

## 2022-10-04 DIAGNOSIS — G4700 Insomnia, unspecified: Secondary | ICD-10-CM | POA: Diagnosis not present

## 2022-10-04 DIAGNOSIS — F331 Major depressive disorder, recurrent, moderate: Secondary | ICD-10-CM | POA: Diagnosis not present

## 2022-10-04 DIAGNOSIS — I10 Essential (primary) hypertension: Secondary | ICD-10-CM | POA: Diagnosis not present

## 2022-10-04 DIAGNOSIS — I2510 Atherosclerotic heart disease of native coronary artery without angina pectoris: Secondary | ICD-10-CM | POA: Diagnosis not present

## 2022-10-04 DIAGNOSIS — E7800 Pure hypercholesterolemia, unspecified: Secondary | ICD-10-CM | POA: Diagnosis not present

## 2022-10-04 DIAGNOSIS — J449 Chronic obstructive pulmonary disease, unspecified: Secondary | ICD-10-CM | POA: Diagnosis not present

## 2022-10-18 DIAGNOSIS — M25551 Pain in right hip: Secondary | ICD-10-CM | POA: Diagnosis not present

## 2022-11-27 DIAGNOSIS — J441 Chronic obstructive pulmonary disease with (acute) exacerbation: Secondary | ICD-10-CM | POA: Diagnosis not present

## 2022-11-27 DIAGNOSIS — Z6825 Body mass index (BMI) 25.0-25.9, adult: Secondary | ICD-10-CM | POA: Diagnosis not present

## 2023-03-05 DIAGNOSIS — F331 Major depressive disorder, recurrent, moderate: Secondary | ICD-10-CM | POA: Diagnosis not present

## 2023-03-05 DIAGNOSIS — F411 Generalized anxiety disorder: Secondary | ICD-10-CM | POA: Diagnosis not present

## 2023-05-16 DIAGNOSIS — R3912 Poor urinary stream: Secondary | ICD-10-CM | POA: Diagnosis not present

## 2023-05-16 DIAGNOSIS — N5201 Erectile dysfunction due to arterial insufficiency: Secondary | ICD-10-CM | POA: Diagnosis not present

## 2023-05-16 DIAGNOSIS — R972 Elevated prostate specific antigen [PSA]: Secondary | ICD-10-CM | POA: Diagnosis not present

## 2023-05-16 DIAGNOSIS — N401 Enlarged prostate with lower urinary tract symptoms: Secondary | ICD-10-CM | POA: Diagnosis not present

## 2023-06-14 DIAGNOSIS — B370 Candidal stomatitis: Secondary | ICD-10-CM | POA: Diagnosis not present

## 2023-06-14 DIAGNOSIS — R635 Abnormal weight gain: Secondary | ICD-10-CM | POA: Diagnosis not present

## 2023-06-22 DIAGNOSIS — C44319 Basal cell carcinoma of skin of other parts of face: Secondary | ICD-10-CM | POA: Diagnosis not present

## 2023-06-22 DIAGNOSIS — L280 Lichen simplex chronicus: Secondary | ICD-10-CM | POA: Diagnosis not present

## 2023-09-28 DIAGNOSIS — F331 Major depressive disorder, recurrent, moderate: Secondary | ICD-10-CM | POA: Diagnosis not present

## 2023-09-28 DIAGNOSIS — F411 Generalized anxiety disorder: Secondary | ICD-10-CM | POA: Diagnosis not present

## 2023-10-04 DIAGNOSIS — J449 Chronic obstructive pulmonary disease, unspecified: Secondary | ICD-10-CM | POA: Diagnosis not present

## 2023-10-04 DIAGNOSIS — F331 Major depressive disorder, recurrent, moderate: Secondary | ICD-10-CM | POA: Diagnosis not present

## 2023-10-04 DIAGNOSIS — E559 Vitamin D deficiency, unspecified: Secondary | ICD-10-CM | POA: Diagnosis not present

## 2023-10-04 DIAGNOSIS — I10 Essential (primary) hypertension: Secondary | ICD-10-CM | POA: Diagnosis not present

## 2023-10-04 DIAGNOSIS — Z79899 Other long term (current) drug therapy: Secondary | ICD-10-CM | POA: Diagnosis not present

## 2023-10-04 DIAGNOSIS — E7800 Pure hypercholesterolemia, unspecified: Secondary | ICD-10-CM | POA: Diagnosis not present

## 2023-10-04 DIAGNOSIS — Z125 Encounter for screening for malignant neoplasm of prostate: Secondary | ICD-10-CM | POA: Diagnosis not present

## 2023-10-12 ENCOUNTER — Other Ambulatory Visit: Payer: Self-pay | Admitting: Urology

## 2023-10-12 DIAGNOSIS — R972 Elevated prostate specific antigen [PSA]: Secondary | ICD-10-CM

## 2023-10-15 DIAGNOSIS — Z8679 Personal history of other diseases of the circulatory system: Secondary | ICD-10-CM | POA: Diagnosis not present

## 2023-10-15 DIAGNOSIS — R918 Other nonspecific abnormal finding of lung field: Secondary | ICD-10-CM | POA: Diagnosis not present

## 2023-10-15 DIAGNOSIS — Z20828 Contact with and (suspected) exposure to other viral communicable diseases: Secondary | ICD-10-CM | POA: Diagnosis not present

## 2023-10-15 DIAGNOSIS — E871 Hypo-osmolality and hyponatremia: Secondary | ICD-10-CM | POA: Diagnosis not present

## 2023-10-15 DIAGNOSIS — I2510 Atherosclerotic heart disease of native coronary artery without angina pectoris: Secondary | ICD-10-CM | POA: Diagnosis not present

## 2023-10-15 DIAGNOSIS — Z66 Do not resuscitate: Secondary | ICD-10-CM | POA: Diagnosis not present

## 2023-10-15 DIAGNOSIS — I7090 Unspecified atherosclerosis: Secondary | ICD-10-CM | POA: Diagnosis not present

## 2023-10-15 DIAGNOSIS — R079 Chest pain, unspecified: Secondary | ICD-10-CM | POA: Diagnosis not present

## 2023-10-15 DIAGNOSIS — J9601 Acute respiratory failure with hypoxia: Secondary | ICD-10-CM | POA: Diagnosis not present

## 2023-10-15 DIAGNOSIS — I10 Essential (primary) hypertension: Secondary | ICD-10-CM | POA: Diagnosis not present

## 2023-10-15 DIAGNOSIS — J439 Emphysema, unspecified: Secondary | ICD-10-CM | POA: Diagnosis not present

## 2023-10-15 DIAGNOSIS — J441 Chronic obstructive pulmonary disease with (acute) exacerbation: Secondary | ICD-10-CM | POA: Diagnosis not present

## 2023-10-15 DIAGNOSIS — R0602 Shortness of breath: Secondary | ICD-10-CM | POA: Diagnosis not present

## 2023-10-15 DIAGNOSIS — F32A Depression, unspecified: Secondary | ICD-10-CM | POA: Diagnosis not present

## 2023-10-15 DIAGNOSIS — F419 Anxiety disorder, unspecified: Secondary | ICD-10-CM | POA: Diagnosis not present

## 2023-10-16 DIAGNOSIS — J441 Chronic obstructive pulmonary disease with (acute) exacerbation: Secondary | ICD-10-CM | POA: Diagnosis not present

## 2023-10-23 DIAGNOSIS — J449 Chronic obstructive pulmonary disease, unspecified: Secondary | ICD-10-CM | POA: Diagnosis not present

## 2023-10-23 DIAGNOSIS — J441 Chronic obstructive pulmonary disease with (acute) exacerbation: Secondary | ICD-10-CM | POA: Diagnosis not present

## 2023-10-23 DIAGNOSIS — Z09 Encounter for follow-up examination after completed treatment for conditions other than malignant neoplasm: Secondary | ICD-10-CM | POA: Diagnosis not present

## 2023-11-12 DIAGNOSIS — R972 Elevated prostate specific antigen [PSA]: Secondary | ICD-10-CM | POA: Diagnosis not present

## 2023-11-20 ENCOUNTER — Ambulatory Visit
Admission: RE | Admit: 2023-11-20 | Discharge: 2023-11-20 | Disposition: A | Discharge status: Home / Self Care | Payer: No Typology Code available for payment source | Source: Ambulatory Visit | Attending: Urology | Admitting: Urology

## 2023-11-20 DIAGNOSIS — R972 Elevated prostate specific antigen [PSA]: Secondary | ICD-10-CM | POA: Diagnosis not present

## 2023-11-20 MED ORDER — GADOPICLENOL 0.5 MMOL/ML IV SOLN
7.5000 mL | Freq: Once | INTRAVENOUS | Status: AC | PRN
  Administered 2023-11-20: 7.5 mL via INTRAVENOUS

## 2023-12-03 DIAGNOSIS — R972 Elevated prostate specific antigen [PSA]: Secondary | ICD-10-CM | POA: Diagnosis not present

## 2023-12-04 DIAGNOSIS — Z9981 Dependence on supplemental oxygen: Secondary | ICD-10-CM

## 2023-12-04 HISTORY — DX: Dependence on supplemental oxygen: Z99.81

## 2024-01-03 DIAGNOSIS — C61 Malignant neoplasm of prostate: Secondary | ICD-10-CM

## 2024-01-03 HISTORY — DX: Malignant neoplasm of prostate: C61

## 2024-01-04 DIAGNOSIS — C61 Malignant neoplasm of prostate: Secondary | ICD-10-CM | POA: Diagnosis not present

## 2024-01-14 ENCOUNTER — Telehealth: Payer: Self-pay | Admitting: Radiation Oncology

## 2024-01-14 NOTE — Telephone Encounter (Signed)
 Pt called asking about a referral from Dr. Valeta Gaudier to Dr. Lorri Rota. Pt was advised our team has received this referral and we are awaiting PET to be scheduled per Dr. Valeta Gaudier. Pt states he was not made aware of this when he called Dr. Harman Lightning office to check on the status of this referral. I advised I would notify my coworker who is working on referral that he had called and add in the prostate navigator to coordinate appts. Pt verbalized understanding of this and getting a call from our team once imaging is scheduled.

## 2024-01-15 NOTE — Progress Notes (Signed)
 RN spoke with Terry Kemp to update that we are awaiting [facility] PET scan date so consult can follow after.  Nuclear medicine will reach out to Terry Kemp on next week due to scheduler being out of office this week.  Terry Kemp is scheduled to review recommendations with Dr. Valeta Gaudier on 5/16.  Will continue to follow to ensure consult is scheduled post PS[facility] PET.

## 2024-01-16 ENCOUNTER — Other Ambulatory Visit (HOSPITAL_COMMUNITY): Payer: Self-pay | Admitting: Urology

## 2024-01-16 DIAGNOSIS — Z87891 Personal history of nicotine dependence: Secondary | ICD-10-CM | POA: Diagnosis not present

## 2024-01-16 DIAGNOSIS — C61 Malignant neoplasm of prostate: Secondary | ICD-10-CM

## 2024-01-18 ENCOUNTER — Telehealth: Payer: Self-pay | Admitting: Radiation Oncology

## 2024-01-18 DIAGNOSIS — R3912 Poor urinary stream: Secondary | ICD-10-CM | POA: Diagnosis not present

## 2024-01-18 DIAGNOSIS — N5201 Erectile dysfunction due to arterial insufficiency: Secondary | ICD-10-CM | POA: Diagnosis not present

## 2024-01-18 DIAGNOSIS — C61 Malignant neoplasm of prostate: Secondary | ICD-10-CM | POA: Diagnosis not present

## 2024-01-18 DIAGNOSIS — N401 Enlarged prostate with lower urinary tract symptoms: Secondary | ICD-10-CM | POA: Diagnosis not present

## 2024-01-18 NOTE — Telephone Encounter (Signed)
Left message for patient to call back to schedule consult per 5/7 referral. 

## 2024-01-21 NOTE — Progress Notes (Signed)
 GU Location of Tumor / Histology: Prostate Ca  If Prostate Cancer, Gleason Score is (4 + 4) and PSA is (3.81 on 11/12/2023)  Terry Kemp presented as referral from Dr. Perley Bradley [facility]) for elevated PSA.  Biopsies     01/16/2024 Dr. Perley Bradley NM PET (PSMA) Skull to Mid Thigh Clinical Data: IMPRESSION: Heterogeneous abnormal uptake along the prostate diffusely more on the left side than right. Please correlate for location of abnormality on biopsy and patient's prior MRI. There are numerous scattered small hypermetabolic lymph nodes identified along the retroperitoneum. These extend from the level of the adrenal gland on the left down to involve aortocaval, para-aortic.  Additional abnormal hypermetabolic nodes seen along the left common iliac chain and external iliac chain.  No osseous or hepatic areas of abnormal uptake.  5 mm non hypermetabolic lingular lung nodule not seen on CT scan of February 2024. Recommend follow up surveillance in 1 year or sooner as clinically directed.   11/20/2023 Dr. Perley Bradley MR Prostate with/without Contrast CLINICAL DATA: Elevated PSA level. R97.20   IMPRESSION: 1. Large PI-RADS category 5 lesion of the left peripheral zone with transcapsular spread tumor and left neurovascular bundle involvement. Targeting data sent to [facility]. 2. Benign prostatic hypertrophy. 3. Atheromatous vascular calcifications. Lack patency of the left common iliac artery and left external iliac artery distal to the stent, the external iliac artery appears reconstituted distally.    Past/Anticipated interventions by urology, if any:  NA  Past/Anticipated interventions by medical oncology, if any:  NA  Weight changes, if any:  No  IPSS:  18 SHIM:  5  Bowel/Bladder complaints, if any:  Weak urinary stream. No bowel issues at this time.  Nausea/Vomiting, if any: No  Pain issues, if any:  0/10  SAFETY ISSUES: Prior  radiation?  No Pacemaker/ICD? No Possible current pregnancy? Male Is the patient on methotrexate? No  Current Complaints / other details:

## 2024-01-24 ENCOUNTER — Encounter (HOSPITAL_COMMUNITY)
Admission: RE | Admit: 2024-01-24 | Discharge: 2024-01-24 | Disposition: A | Discharge status: Home / Self Care | Source: Ambulatory Visit | Attending: Urology | Admitting: Urology

## 2024-01-24 DIAGNOSIS — C61 Malignant neoplasm of prostate: Secondary | ICD-10-CM | POA: Insufficient documentation

## 2024-01-24 MED ORDER — FLOTUFOLASTAT F 18 GALLIUM 296-5846 MBQ/ML IV SOLN
8.3000 | Freq: Once | INTRAVENOUS | Status: AC
  Administered 2024-01-24: 8.3 via INTRAVENOUS
  Filled 2024-01-24: qty 9

## 2024-01-24 NOTE — Progress Notes (Signed)
 STAT request placed for PSMA PET for upcoming consult on 5/27.

## 2024-01-28 NOTE — Progress Notes (Signed)
 Radiation Oncology         [phone]) [phone] ________________________________  Initial Outpatient Consultation  Name: Terry Kemp MRN: [id number]  Date: 01/29/2024  DOB: 03-17-1946  JX:BJYN, Laretta Pleasure, MD  Roxane Copp, MD   REFERRING PHYSICIAN: Roxane Copp, MD  DIAGNOSIS: 78 y.o. gentleman with Stage T1c adenocarcinoma of the prostate with Gleason score of 4+4, and PSA of 3.81.    ICD-10-CM   1. Malignant neoplasm of prostate (HCC)  C61       HISTORY OF PRESENT ILLNESS: Terry Kemp is a 78 y.o. male with a diagnosis of prostate cancer. He was noted to have an rising PSA of 3.24 by his primary care physician, Dr. Avanell Bob.  Accordingly, he was referred for evaluation in urology by Dr. Valeta Gaudier on 05/16/23,  digital rectal examination performed at that time showed asymmetry L>R with no discrete nodule.  The PSA had decreased to 2.52 when repeated in the office the day of consult so the recommendation was to continue to monitor the PSA closely.  The PSA had again increased to 3.81 when repeated in March 2025 prompting further evaluation with a prostate MRI that was performed on 11/20/23 showing a large PI-RADS 5 lesion in the left peripheral zone with transcapsular spread and left neurovascular bundle involvement. The patient proceeded to MRI fusion biopsy of the prostate on 01/04/24.  The prostate volume measured 31.09 cc.  Out of 15 core biopsies, all 15 were positive.  The maximum Gleason score was 4+4, and this was seen in the right apex lateral (with perineural invasion). All remaining 14 cores showed Gleason 4+3 prostatic adenocarcinoma.  He underwent staging PSMA PET scan on 01/24/24 showing, in addition to diffuse activity within the prostate, numerous scattered small hypermetabolic lymph nodes along the retroperitoneum, extending from the level of the adrenal gland to the aortocaval and para-aortic levels with additional abnormal hypermetabolic nodes seen along the left common  and external iliac chains.there were no osseous or hepatic areas of abnormal uptake. Incidentally noted was a new 5 mm non-hypermetabolic lingular lung nodule that is felt to be indeterminant but the patient does have known COPD.  The patient reviewed the biopsy results with his urologist and he has kindly been referred today for discussion of potential radiation treatment options.   PREVIOUS RADIATION THERAPY: No  PAST MEDICAL HISTORY:  Past Medical History:  Diagnosis Date   Anxiety    Arthritis    CAD S/P percutaneous coronary angioplasty 01/2005   ST elevation MI -CATH/PCI-RCA (Cypher DES 3 x 28 - 3.55mm); CATH 2008 FOR INFERIOR ISCHEMIA - 80% dRCA ISR reduced to 40% with NTG - spasm.   COPD mixed type (HCC)    Long-term smoker.  Managed by PCP.   Depression    He is on BuSpar and nightly Seroquel.   Elevated PSA    Hyperlipidemia    Hypertension    PAD (peripheral artery disease) (HCC) 2000   bilateral iliac arteries PTA  and  stenting in 04/30/1998; In 2006-subsequent  R Common Iliac (RCIA) 100% ISR, 75 % LCIA -- staged Bifuracation Kissing Stent creating new Aortic Carina.-- Overlapping 7 mm x 39mm & 18mm stents RCIA & kissing 7 mm x 29 mm in LCIA.   ST elevation myocardial infarction (STEMI) involving other coronary artery of inferior wall (HCC) 04/2005   RCA PCI   Stroke [facility]) 2013   a) 2013: Felt to be a TIA;; b) 08/24/2015: Imaging confirmed CVA - mild residual R-sided weakness  PAST SURGICAL HISTORY: Past Surgical History:  Procedure Laterality Date   ABDOMINAL AOTROGRAM  07/17/2007   ADDTION WITH CARDIAC CATH---(INFRARENAL WITH BIFEMORAL RUNOFF )----WIDELY PATENT STENTS TO LEFT AND RIGHT COMMON ILIACS   BACK SURGERY     metal plate   CARDIAC CATHETERIZATION  01/10/2005   Inf STEMI: dLM 50% - calcified. mRCA 100% (infaract-related vessel) --> DES PCI; AbdAo-Iliac Angio: PAD R Common Iliac (RCIA) 100% ISR, LCIA 75% ISR @ outflow tract of the left iliac. LVEF~ 60%    CARDIAC CATHETERIZATION  07/17/2007   40% dLM (previously read as 50%); 80% mRCA - distal stent ISR --> reduced to 40% with NTG = due to spasm--widely patent bilaeral common illiacs   CORONARY ANGIOPLASTY WITH STENT PLACEMENT  01/10/2005   mRCA DES PCI - CYPHER DES 3.0 x 28 mm -> post-dilated to 3.25 mm   EYE SURGERY Bilateral 2000   cataracts   lower extremities arterial doppler Bilateral 01/26/2005,09/28/2004   01/26/05-- right ABI -MODERATE,LEFT ABI MILLD -RIGHT CIA STENT OCCLUDED ,RIGHT IIA REVERSIBLE FLOW;LEFT CIA >70%--09/28/2004- RGT ABI'S 76, LFT ABI'S102   NM MYOCAR PERF EJECTION FRACTION  06/03/2007   REST/STRESS -- EF 69% MILD INFEROAPICAL ISCHEMIA NOTED - Referred for CATH - RCA PCI   NM MYOVIEW  LTD  11/2014   Low Risk - "Diaphragmatic attenuation", no infarct or ischemia   NM MYOVIEW  LTD  12/26/2021   Lexiscan : EF hyperdynamic > 65%.  No ischemia or infarction.  LOW RISK.   peripheral abd aortic cath  04/04/2005   performed by Dr Ed Gondola---  LCIA 75% ISR, 100% RCIA ISR: kissing balloon PTA  --> Kissing STENTs Bilateral CIA (RCIA 2 overlapping 7.0 x 39 mm & 7.0 x 18 mm Gensis stents, LCIA - 7mm x 29 mm Gensis stent (noted to be patent in 2008 Cath)   peripheral arteries disease  09/1999   status post bilatera iliac stents jan 2001. three stents in the left common iliasc ,1 widely patent stent right common iliac, 2 overlapping stents revealed nio more 10% in-stent restenosis in 2008.    PROSTATE BIOPSY     SPINAL FUSION  11/2014   SURGERY BY DR Ellery Guthrie   TRANSTHORACIC ECHOCARDIOGRAM  09/2015   Normal LV size and systolic function, EF 60-65%. Normal RV size.  No valvular lesions.   TRANSTHORACIC ECHOCARDIOGRAM  12/21/2021   Normal LV size and function.  EF 65 to 70%.  No RWMA.  GR 1 DD.  Normal RV size and function.  Unable to assess PAP.  Normal valves.  Normal RAP.    FAMILY HISTORY:  Family History  Adopted: Yes  Problem Relation Age of Onset   Cancer Mother    Heart  disease Maternal Grandfather     SOCIAL HISTORY:  Social History   Socioeconomic History   Marital status: Divorced    Spouse name: Hisako   Number of children: 2   Years of education: college   Highest education level: Not on file  Occupational History   Not on file  Tobacco Use   Smoking status: Former    Current packs/day: 0.00    Average packs/day: 1 pack/day for 38.0 years (38.0 ttl pk-yrs)    Types: Cigarettes    Start date: 01/02/1979    Quit date: 01/01/2017    Years since quitting: 7.0   Smokeless tobacco: Never   Tobacco comments:    He quit in 2017, restarted then went back to smoking again.  Again quit in 2018 and then finally  quit progression in 2020.  No longer using Nicorette pills.  Vaping Use   Vaping status: Never Used  Substance and Sexual Activity   Alcohol use: Yes    Alcohol/week: 14.0 standard drinks of alcohol    Types: 14 Glasses of wine per week    Comment: Usually drinks about two 6 ounce glasses of wine a night.   Drug use: No   Sexual activity: Not on file  Other Topics Concern   Not on file  Social History Narrative   Following his most recent stroke, he no longer had the ability to manage himself in the home alone.  He did not trust himself to try to manage his finances, most notably his online trading.  Therefore turned his house over to his son and went back in with his ex-wife in 2020.      Lives with ex-wife   Right handed   Drinks 2-3 cups caffeine daily      Diet includes monitor servings of fruits and vegetables a day.  32 ounces water beginning a day.      Usually tries to exercise by walking about half a mile but every day.  Less active doing yard work.   Social Drivers of Corporate investment banker Strain: Not on file  Food Insecurity: No Food Insecurity (01/29/2024)   Hunger Vital Sign    Worried About Running Out of Food in the Last Year: Never true    Ran Out of Food in the Last Year: Never true  Transportation Needs: No  Transportation Needs (01/29/2024)   PRAPARE - Administrator, Civil Service (Medical): No    Lack of Transportation (Non-Medical): No  Physical Activity: Not on file  Stress: Not on file  Social Connections: Not on file  Intimate Partner Violence: Not At Risk (01/29/2024)   Humiliation, Afraid, Rape, and Kick questionnaire    Fear of Current or Ex-Partner: No    Emotionally Abused: No    Physically Abused: No    Sexually Abused: No    ALLERGIES: Bupropion, Varenicline, and Zoloft  [sertraline  hcl]  MEDICATIONS:  Current Outpatient Medications  Medication Sig Dispense Refill   albuterol  (VENTOLIN  HFA) 108 (90 Base) MCG/ACT inhaler 2 puffs as needed     amLODipine  (NORVASC ) 10 MG tablet TAKE 1 TABLET BY MOUTH ONCE DAILY (Patient taking differently: Take 10 mg by mouth daily.) 90 tablet 2   aspirin  EC 81 MG tablet Take 1 tablet (81 mg total) by mouth daily. Swallow whole. 30 tablet 11   atorvastatin  (LIPITOR) 40 MG tablet Take 1 tablet by mouth once daily 60 tablet 0   B Complex CAPS 1 tablet     busPIRone (BUSPAR) 10 MG tablet 1 tablet     clopidogrel  (PLAVIX ) 75 MG tablet 1 tablet     Cyanocobalamin (VITAMIN B-12 CR PO) Take by mouth daily. Reported on 08/26/2015     fluticasone-salmeterol (ADVAIR) 250-50 MCG/ACT AEPB Take by mouth.     lamoTRIgine (LAMICTAL) 25 MG tablet 1 tablet     lithium  carbonate (LITHOBID) 300 MG ER tablet Take 300 mg by mouth.     metoprolol  succinate (TOPROL  XL) 25 MG 24 hr tablet Take 1 tablet (25 mg total) by mouth daily. 90 tablet 3   nicotine  polacrilex (COMMIT) 2 MG lozenge Take 2 mg by mouth as needed for smoking cessation. Reports takeing12 per day     QUEtiapine (SEROQUEL) 50 MG tablet Take 50 mg by mouth at bedtime.  ramipril  (ALTACE ) 10 MG capsule TAKE 1 CAPSULE BY MOUTH DAILY (Patient taking differently: Take 10 mg by mouth daily.) 90 capsule 0   tiotropium (SPIRIVA) 18 MCG inhalation capsule Place into inhaler and inhale.      Multiple Vitamin (MULTIVITAMIN) tablet Take 1 tablet by mouth daily. (Patient not taking: Reported on 01/29/2024)     nitroGLYCERIN (NITROSTAT) 0.4 MG SL tablet Place under the tongue. (Patient not taking: Reported on 01/29/2024)     No current facility-administered medications for this encounter.    REVIEW OF SYSTEMS:  On review of systems, the patient reports that he is doing well overall. He denies any chest pain, shortness of breath, cough, fevers, chills, night sweats, unintended weight changes. He denies any bowel disturbances, and denies abdominal pain, nausea or vomiting. He denies any new musculoskeletal or joint aches or pains. His IPSS was 18, indicating severe urinary symptoms. His SHIM was 5, indicating he has severe erectile dysfunction. A complete review of systems is obtained and is otherwise negative.    PHYSICAL EXAM:  Wt Readings from Last 3 Encounters:  01/29/24 145 lb (65.8 kg)  01/17/22 145 lb 9.6 oz (66 kg)  12/21/21 149 lb (67.6 kg)   Temp Readings from Last 3 Encounters:  01/29/24 97.8 F (36.6 C) (Oral)  07/30/21 98 F (36.7 C) (Oral)  07/01/19 97.7 F (36.5 C) (Oral)   BP Readings from Last 3 Encounters:  01/29/24 (!) 166/84  05/30/22 135/76  01/17/22 140/70   Pulse Readings from Last 3 Encounters:  01/29/24 69  01/17/22 61  12/02/21 62   Pain Assessment Pain Score: 0-No pain/10  In general this is a well appearing Caucasian male in no acute distress. He's alert and oriented x4 and appropriate throughout the examination. Cardiopulmonary assessment is negative for acute distress, and he exhibits normal effort.     KPS = 100  100 - Normal; no complaints; no evidence of disease. 90   - Able to carry on normal activity; minor signs or symptoms of disease. 80   - Normal activity with effort; some signs or symptoms of disease. 30   - Cares for self; unable to carry on normal activity or to do active work. 60   - Requires occasional assistance, but is  able to care for most of his personal needs. 50   - Requires considerable assistance and frequent medical care. 40   - Disabled; requires special care and assistance. 30   - Severely disabled; hospital admission is indicated although death not imminent. 20   - Very sick; hospital admission necessary; active supportive treatment necessary. 10   - Moribund; fatal processes progressing rapidly. 0     - Dead  Karnofsky DA, Abelmann WH, Craver LS and Burchenal [facility] [phone]) The use of the nitrogen mustards in the palliative treatment of carcinoma: with particular reference to bronchogenic carcinoma Cancer 1 634-56  LABORATORY DATA:  Lab Results  Component Value Date   WBC 9.0 07/26/2021   HGB 13.6 07/26/2021   HCT 38.1 (L) 07/26/2021   MCV 87.8 07/26/2021   PLT 183 07/26/2021   Lab Results  Component Value Date   NA 134 (L) 07/27/2021   K 3.5 07/27/2021   CL 104 07/27/2021   CO2 22 07/27/2021   Lab Results  Component Value Date   ALT 16 07/26/2021   AST 15 07/26/2021   ALKPHOS 50 07/26/2021   BILITOT 0.9 07/26/2021     RADIOGRAPHY: NM PET (PSMA) SKULL TO MID THIGH  Result Date: 01/24/2024 CLINICAL DATA:  Initial treatment strategy for prostate cancer. EXAM: NUCLEAR MEDICINE PET SKULL BASE TO THIGH TECHNIQUE: 8.4 mCi F18 Posluma was injected intravenously. Full-ring PET imaging was performed from the skull base to thigh after the radiotracer. CT data was obtained and used for attenuation correction and anatomic localization. COMPARISON:  MRI prostate 11/20/2023. chest CT noncontrast 10/10/2022. Images only. No report. FINDINGS: NECK There is physiologic distribution of tracer along lacrimal in salivary glands. Neural areas of uptake. No specific abnormal uptake seen along lymph nodes in the neck including submandibular, posterior triangle or internal jugular region. Incidental CT finding: The parotid glands, submandibular glands are unremarkable. Thyroid  gland is slightly small. Streak  artifact related to the patient's dental hardware. Right greater than left middle turbinate concha bullosa. The paranasal sinuses and mastoid air cells are clear. CHEST No abnormal uptake above mediastinal blood pool in the axillary region, hilum or mediastinum. No abnormal lung uptake. Incidental CT finding: The thoracic aorta is normal course and caliber. Scattered vascular calcifications along the aorta as well as great vessels and coronary arteries. In particular the left subclavian artery. The heart is not enlarged. No pericardial effusion. Normal caliber thoracic esophagus. Emphysematous lung changes identified. Breathing motion. Basilar scar or atelectasis. Also dependent changes along the right upper lobe. 5 mm lingular nodule identified. Please correlate with clinical findings. No abnormal uptake on this examination. This was not seen on a chest CT scan of 10/10/2022. ABDOMEN/PELVIS Prostate: Patchy heterogeneous uptake along the prostate more on the left than on the right. Maximum SUV value of these areas approach 6.0. Please correlate with findings at biopsy and the patient's prostate MRI. Lymph nodes: Cluster of abnormal uptake identified in the left hemipelvis at the junction of the common to external iliac. Focal lesion has maximum SUV of 7.7 and corresponds to a small node on image 161 of series 4 measuring 8 by 4 mm. Additional smaller focus just posterior to this as well on image 161. Additional areas more superiorly in the pelvis the level of the common iliac. Two such foci are seen adjacent to one another. The more medial focus has maximum SUV of 9.3 and the lateral 8.5. Small node on image 152 in this location measures 6 mm and the more lateral focus on image 150 measures 6 mm. There are several additional retroperitoneal abdominal nodes extending from the left adrenal gland down to the aortic bifurcation. Examples include left periaortic which have abnormal uptake such as measuring maximum SUV  of 6.8 and a small node just medial to the course of the left ureter measures 6 mm on image 142. Additional focus on image 140. Additional nodes seen aortocaval such as image 133 measuring on CT at 6 mm and maximum SUV of 4.7. Lastly there is a focus just medial to left adrenal gland which has uptake of maximum SUV of 3.6. Liver: No evidence of liver metastasis. Incidental CT finding: Several tiny low-attenuation midbrain lesions are identified, too small to completely characterize but likely benign cystic foci statistically. Adrenal glands and pancreas are preserved. The spleen is not enlarged. No abnormal calcifications seen within either kidney nor along the course of either ureter. Preserved contour to the urinary bladder. The stomach is nondilated. Scattered luminal debris. The small bowel is nondilated. Large bowel has a normal course and caliber. Scattered colonic stool. Circumaortic left renal vein. Extensive vascular calcifications. Small bilateral fat containing inguinal hernias. SKELETON No specific abnormal uptake along the visualized osseous structures. IMPRESSION:  Heterogeneous abnormal uptake along the prostate diffusely more on the left side than right. Please correlate for location of abnormality on biopsy and patient's prior MRI. There are numerous scattered small hypermetabolic lymph nodes identified along the retroperitoneum. These extend from the level of the adrenal gland on the left down to involve aortocaval, para-aortic. Additional abnormal hypermetabolic nodes seen along the left common iliac chain and external iliac chain. No osseous or hepatic areas of abnormal uptake. 5 mm non hypermetabolic lingular lung nodule not seen on CT scan of February 2024. Recommend follow up surveillance in 1 year or sooner as clinically directed. Electronically Signed   By: Adrianna Horde M.D.   On: 01/24/2024 16:07      IMPRESSION/PLAN: 1. 78 y.o. gentleman with Stage T1c adenocarcinoma of the prostate  with Gleason Score of 4+4, and PSA of 3.81. We discussed the patient's workup and outlined the nature of prostate cancer in this setting. The patient's T stage, Gleason's score, and PSA put him into the high risk group. Accordingly, he is eligible for a variety of potential treatment options including prostatectomy or LT-ADT concurrent with either 8 weeks of external radiation or 5 weeks of external radiation with an upfront brachytherapy boost. We discussed the available radiation techniques, and focused on the details and logistics of delivery.  He is not an ideal candidate for brachytherapy boost given his significant LUTS.  Therefore, we discussed and outlined the risks, benefits, short and long-term effects associated with daily external beam radiotherapy and compared and contrasted these with prostatectomy. We discussed the role of SpaceOAR gel in reducing the rectal toxicity associated with radiotherapy. We also detailed the role of ADT in the treatment of high risk prostate cancer and outlined the associated side effects that could be expected with this therapy. He appears to have a good understanding of his disease and our treatment recommendations which are of curative intent.  He was encouraged to ask questions that were answered to his stated satisfaction.  At the conclusion of our conversation, the patient is interested in moving forward with 8 weeks of external beam therapy concurrent with ADT. He has not started ADT so we will share our discussion with Dr. Valeta Gaudier and make arrangements for a follow-up visit in the urology office to start ADT now.  We will also make arrangements for fiducial markers and SpaceOAR gel placement in late July 2025, prior to simulation, to reduce rectal toxicity from radiotherapy. The patient appears to have a good understanding of his disease and our treatment recommendations which are of curative intent and is in agreement with the stated plan.  Therefore, we will move  forward with treatment planning accordingly, in anticipation of beginning IMRT approximately 2 months after starting ADT.  We enjoyed meeting him today and look forward to continuing to participate in his care. .  We personally spent 70 minutes in this encounter including chart review, reviewing radiological studies, meeting face-to-face with the patient, entering orders, coordinating care and completing documentation.    Arta Bihari, PA-C    Kenith Payer, MD  [facility]  Radiation Oncology Direct Dial: [phone]  Fax: [phone] [email or web address]  Skype  [email or web address]   This document serves as a record of services personally performed by Kenith Payer, MD and Keitha Pata, PA-C. It was created on their behalf by Florance Hun, a trained medical scribe. The creation of this record is based on the scribe's personal observations and the provider's statements to them. This document has been checked  and approved by the attending provider.

## 2024-01-29 ENCOUNTER — Ambulatory Visit
Admission: RE | Admit: 2024-01-29 | Discharge: 2024-01-29 | Disposition: A | Discharge status: Home / Self Care | Source: Ambulatory Visit | Attending: Radiation Oncology | Admitting: Radiation Oncology

## 2024-01-29 ENCOUNTER — Encounter: Payer: Self-pay | Admitting: Radiation Oncology

## 2024-01-29 VITALS — BP 166/84 | HR 69 | Temp 97.8°F | Resp 20 | Ht 63.0 in | Wt 145.0 lb

## 2024-01-29 DIAGNOSIS — Z7951 Long term (current) use of inhaled steroids: Secondary | ICD-10-CM | POA: Diagnosis not present

## 2024-01-29 DIAGNOSIS — Z809 Family history of malignant neoplasm, unspecified: Secondary | ICD-10-CM | POA: Insufficient documentation

## 2024-01-29 DIAGNOSIS — C61 Malignant neoplasm of prostate: Secondary | ICD-10-CM | POA: Insufficient documentation

## 2024-01-29 DIAGNOSIS — E785 Hyperlipidemia, unspecified: Secondary | ICD-10-CM | POA: Insufficient documentation

## 2024-01-29 DIAGNOSIS — I10 Essential (primary) hypertension: Secondary | ICD-10-CM | POA: Insufficient documentation

## 2024-01-29 DIAGNOSIS — Z8673 Personal history of transient ischemic attack (TIA), and cerebral infarction without residual deficits: Secondary | ICD-10-CM | POA: Insufficient documentation

## 2024-01-29 DIAGNOSIS — F419 Anxiety disorder, unspecified: Secondary | ICD-10-CM | POA: Diagnosis not present

## 2024-01-29 DIAGNOSIS — I252 Old myocardial infarction: Secondary | ICD-10-CM | POA: Diagnosis not present

## 2024-01-29 DIAGNOSIS — Z79899 Other long term (current) drug therapy: Secondary | ICD-10-CM | POA: Insufficient documentation

## 2024-01-29 DIAGNOSIS — Z191 Hormone sensitive malignancy status: Secondary | ICD-10-CM | POA: Diagnosis not present

## 2024-01-29 DIAGNOSIS — Z7902 Long term (current) use of antithrombotics/antiplatelets: Secondary | ICD-10-CM | POA: Diagnosis not present

## 2024-01-29 DIAGNOSIS — I739 Peripheral vascular disease, unspecified: Secondary | ICD-10-CM | POA: Diagnosis not present

## 2024-01-29 DIAGNOSIS — I2510 Atherosclerotic heart disease of native coronary artery without angina pectoris: Secondary | ICD-10-CM | POA: Diagnosis not present

## 2024-01-29 DIAGNOSIS — F17210 Nicotine dependence, cigarettes, uncomplicated: Secondary | ICD-10-CM | POA: Insufficient documentation

## 2024-01-29 DIAGNOSIS — Z7982 Long term (current) use of aspirin: Secondary | ICD-10-CM | POA: Insufficient documentation

## 2024-01-29 DIAGNOSIS — J449 Chronic obstructive pulmonary disease, unspecified: Secondary | ICD-10-CM | POA: Diagnosis not present

## 2024-01-29 HISTORY — DX: Elevated prostate specific antigen (PSA): R97.20

## 2024-01-30 ENCOUNTER — Telehealth: Payer: Self-pay | Admitting: *Deleted

## 2024-01-30 NOTE — Telephone Encounter (Signed)
 CALLED PATIENT TO INFORM OF ADT APPT. FOR 03-05-24- ARRIVAL TIME- 2:30 PM, @ DR. PACE'S OFFICE, SPOKE WITH PATIENT AND HE IS AWARE OF THIS APPT.

## 2024-01-31 NOTE — Progress Notes (Signed)
 Patient is now scheduled for ADT with Dr. Valeta Gaudier on 6/4.   RN spoke with patient and provided updated appointment information. Plan of care in progress.

## 2024-02-06 DIAGNOSIS — C61 Malignant neoplasm of prostate: Secondary | ICD-10-CM | POA: Diagnosis not present

## 2024-02-06 DIAGNOSIS — R3912 Poor urinary stream: Secondary | ICD-10-CM | POA: Diagnosis not present

## 2024-02-06 DIAGNOSIS — N401 Enlarged prostate with lower urinary tract symptoms: Secondary | ICD-10-CM | POA: Diagnosis not present

## 2024-02-07 NOTE — Progress Notes (Signed)
 Patient met with Dr. Valeta Gaudier and was started on Orgovyx on 6/4.   Pending fiducial's, spaceOAR gel date.

## 2024-02-20 DIAGNOSIS — J40 Bronchitis, not specified as acute or chronic: Secondary | ICD-10-CM

## 2024-02-20 HISTORY — DX: Bronchitis, not specified as acute or chronic: J40

## 2024-02-28 ENCOUNTER — Other Ambulatory Visit: Payer: Self-pay | Admitting: Urology

## 2024-02-29 ENCOUNTER — Other Ambulatory Visit: Payer: Self-pay | Admitting: Urology

## 2024-02-29 DIAGNOSIS — C61 Malignant neoplasm of prostate: Secondary | ICD-10-CM

## 2024-03-05 ENCOUNTER — Encounter (HOSPITAL_COMMUNITY): Payer: Self-pay | Admitting: Urology

## 2024-03-05 NOTE — Progress Notes (Signed)
 Reviewing patient chart for pre-op interview for surgery on 03-11-2024 surgeon Dr Elisabeth procedure prostate gold seed insertion.  Patient has extensive medical history including CAD s/p PCI and stenting/ PVD s/p PCI and stenting / multiple CVA & TIA  and COPD w/ emphysema and dependent supplemental oxygen.  Noted in care [facility] for continued symptoms of cough , chest congestion, sob x4 days, hx COPD w/ oxygen.  Per physician note pt was treated for similar symptoms 02-02-2024 [facility] treated with doxy/ steroids with some improvement but not 100%.  This visit patient has been diagnosed with bronchitis,  has been given zpack/ augmentin/ medrol  pack, patient to follow up with PCP in 4-6 weeks.  Due to URI guidelines will have anesthesia review chart. Chart reviewed w/ anesthesia, Dr Erma MDA.  Dr Erma stated patient will need to be rescheduled in 4 weeks per guidelines with PCP clearance and be 2 weeks asymptotic.  Also, reviewed when patient was last seen by cardiology, Dr Anner 05-30-2022 in office visit in epic when last nuclear test 04/ 2023 low risk w/ no ischemia and [facility] in care [facility]/ 2025 with no change from previous.  Dr Erma stated when patient gets rescheduled as far as cardiology even though patient has not followed up since 05-30-2022 as long as patient is cardiac asymptomatic and can do 4 METS , no cardiac clearance needed.  Called and left message w/ OR scheduler for Dr Elisabeth , Nena Sanders, informed her of patient being dx bronchitis 02-20-2024 , information obtained from [place] office note in epic and patient is on continuous oxygen now. Per URI guidelines and Dr Erma MDA patient will need to be rescheduled in 4 weeks with PCP clearance and be 2 weeks asymptomatic.  Also, when rescheduled as far as cardiac even though patient has not followed up with his cardiologist, Dr Anner,  as long as patient is  asymptomatic and can do 4 METS, no cardiac clearance needed

## 2024-03-05 NOTE — H&P (Signed)
 CC/HPI: cc: prostate cancer   05/16/2023: 78 year old man referred for a rising PSA of 3.24. He has mild LUTS consisting mostly of a weak urinary stream. Urinary symptoms have changed after CVA years ago. They are not bother him enough to want to try medication. He also has erectile dysfunction but is not bothered by this. He recently moved back in with his ex-wife and is content with his situation. No family history of prostate cancer.   IPSS: 1, 0, 1, 1, 2, 0, 1= 6/35  1/6 please quality-of-life  SH IM: 2, 0, 0, 0, 0= 2/25   12/03/2023: 78 year old man with a rising PSA here for MRI results. MRI shows a PI-RADS 5 lesion with 25.7 g prostate. There is also concern for transcapsular spread and neurovascular involvement.   01/04/24: Here for MR-US  fusion biopsy  25.7g prostate  PIRADS 5 lesion   01/18/24: Here for prostate cancer counseling   PSA 3.8  25.7g prostate  PIRADS 5 lesion  Gleason 4+4=8 in 1/15 cores  Gleason 4+3=7 in 14/15 cores   30//57: 78 year old man with recently diagnosed Gleason 4+4= 8 prostate cancer. He has met with Dr. Patrcia in radiation oncology and decided to move forward with ADT and 8 weeks of EBRT. He comes in today to discuss ADT and is interested in [place]. He already takes vitamin D.     ALLERGIES: No Known Allergies    MEDICATIONS: Albuterol  Sulfate Hfa  Altace   amLODIPine  Besylate 10 MG Tablet  Aspirin  81 MG Tablet Delayed Release  Atorvastatin  Calcium  40 MG Tablet  Ergocalciferol  Ipratropium Bromide  lamoTRIgine 25 MG Tablet  Lithium  Carbonate 300 MG Capsule  Nicorette Starter Kit 2 MG Gum  Nitroglycerin 0.4 MG Tablet Sublingual  Prtevagen  QUEtiapine Fumarate 50 MG Tablet  Toprol  XL 25 MG Tablet Extended Release 24 Hour  Vitamin B Complex     GU PSH: Prostate Needle Biopsy - 01/04/2024       PSH Notes: Heart Surgery   NON-GU PSH: Back Surgery (Unspecified) Surgical Pathology, Gross And Microscopic Examination For Prostate Needle -  01/04/2024 Visit Complexity (formerly [facility]) - 01/18/2024     GU PMH: BPH w/LUTS - 01/18/2024, - 05/16/2023 ED due to arterial insufficiency - 01/18/2024, (Stable), - 05/16/2023, Erectile dysfunction due to arterial insufficiency, - 2014 Prostate Cancer - 01/18/2024 Weak Urinary Stream - 01/18/2024, - 05/16/2023 Elevated PSA - 01/04/2024, - 12/03/2023, - 05/16/2023 Other microscopic hematuria, Microscopic hematuria - 2014      PMH Notes:  1898-09-04 00:00:00 - Note: Normal Routine History And Physical Adult  2009-11-23 14:21:15 - Note: Acute Myocardial Infarction  2009-11-23 14:21:15 - Note: Venous Thrombosis   NON-GU PMH: Anxiety Asthma COPD Depression Heart disease, unspecified Hypercholesterolemia Hypertension Myocardial Infarction Stroke/TIA    FAMILY HISTORY: Family Health Status Number - Runs In Family Lung Cancer - Runs in Family   SOCIAL HISTORY: Marital Status: Divorced Preferred Language: English; Ethnicity: Not Hispanic Or Latino; Race: White Current Smoking Status: Patient does not smoke anymore.   Tobacco Use Assessment Completed: Used Tobacco in last 30 days? Does drink.  Drinks 4+ caffeinated drinks per day.     Notes: Caffeine Use, Occupation:, Tobacco Use, Marital History - Currently Married, Alcohol Use   REVIEW OF SYSTEMS:    GU Review Male:   Patient denies frequent urination, hard to postpone urination, burning/ pain with urination, get up at night to urinate, leakage of urine, stream starts and stops, trouble starting your stream, have to strain to urinate , erection  problems, and penile pain.  Gastrointestinal (Upper):   Patient denies nausea, vomiting, and indigestion/ heartburn.  Gastrointestinal (Lower):   Patient denies diarrhea and constipation.  Constitutional:   Patient denies fever, night sweats, weight loss, and fatigue.  Skin:   Patient denies skin rash/ lesion and itching.  Eyes:   Patient denies blurred vision and double vision.  Ears/ Nose/  Throat:   Patient denies sore throat and sinus problems.  Hematologic/Lymphatic:   Patient denies swollen glands and easy bruising.  Cardiovascular:   Patient denies leg swelling and chest pains.  Respiratory:   Patient denies cough and shortness of breath.  Endocrine:   Patient denies excessive thirst.  Musculoskeletal:   Patient denies back pain and joint pain.  Neurological:   Patient denies headaches and dizziness.  Psychologic:   Patient denies depression and anxiety.   VITAL SIGNS: None   MULTI-SYSTEM PHYSICAL EXAMINATION:    Constitutional: Well-nourished. No physical deformities. Normally developed. Good grooming.  Neck: Neck symmetrical, not swollen. Normal tracheal position.  Respiratory: No labored breathing, no use of accessory muscles.   Skin: No paleness, no jaundice, no cyanosis. No lesion, no ulcer, no rash.  Neurologic / Psychiatric: Oriented to time, oriented to place, oriented to person. No depression, no anxiety, no agitation.  Eyes: Normal conjunctivae. Normal eyelids.  Ears, Nose, Mouth, and Throat: Left ear no scars, no lesions, no masses. Right ear no scars, no lesions, no masses. Nose no scars, no lesions, no masses. Normal hearing. Normal lips.  Musculoskeletal: Normal gait and station of head and neck.     Complexity of Data:  Records Review:   Previous Patient Records, POC Tool  Urine Test Review:   Urinalysis  X-Ray Review: PET- PSMA Scan: Reviewed Films. Reviewed Report. Discussed With Patient. IMPRESSION:  Heterogeneous abnormal uptake along the prostate diffusely more on  the left side than right. Please correlate for location of  abnormality on biopsy and patient's prior MRI.   There are numerous scattered small hypermetabolic lymph nodes  identified along the retroperitoneum. These extend from the level of  the adrenal gland on the left down to involve aortocaval,  para-aortic.   Additional abnormal hypermetabolic nodes seen along the left common   iliac chain and external iliac chain.   No osseous or hepatic areas of abnormal uptake.   5 mm non hypermetabolic lingular lung nodule not seen on CT scan of  February 2024. Recommend follow up surveillance in 1 year or sooner  as clinically directed.    Electronically Signed  By: Ranell Bring M.D.  On: 01/24/2024 16:07      11/12/23 05/16/23  PSA  Total PSA 3.81 ng/mL 2.52 ng/mL    PROCEDURES:          Visit Complexity - G2211          Urinalysis Dipstick Dipstick Cont'd  Color: Yellow Bilirubin: Neg mg/dL  Appearance: Clear Ketones: Neg mg/dL  Specific Gravity: 8.989 Blood: Neg ery/uL  pH: 6.0 Protein: Trace mg/dL  Glucose: Neg mg/dL Urobilinogen: 0.2 mg/dL    Nitrites: Neg    Leukocyte Esterase: Neg leu/uL    ASSESSMENT:      ICD-10 Details  1 GU:   Prostate Cancer - C61 Chronic, Stable  2   BPH w/LUTS - N40.1 Chronic, Stable  3   Weak Urinary Stream - R39.12 Chronic, Stable   PLAN:           Document Letter(s):  Created for Patient: Clinical Summary  Notes:   1. Prostate cancer:  - Patient has met with Dr. Patrcia and they previously reviewed his PET scan  - He has elected to proceed with long-term ADT and 8 weeks of EBRT  - We reviewed risks and benefits of ADT and he is interested in starting Orgovyx  - Counseled him how to take the medication as well as about possible side effects. He will also continue vitamin D and add calcium  to this.   He has scheduled for markers/space OAR for July

## 2024-03-11 HISTORY — DX: Other forms of dyspnea: R06.09

## 2024-03-11 HISTORY — DX: Breakdown (mechanical) of unspecified cardiac and vascular devices and implants, initial encounter: T82.519A

## 2024-03-11 HISTORY — DX: Poor urinary stream: R39.12

## 2024-03-11 HISTORY — DX: Generalized anxiety disorder: F41.1

## 2024-03-11 HISTORY — DX: Benign prostatic hyperplasia with lower urinary tract symptoms: N40.1

## 2024-03-11 HISTORY — DX: Major depressive disorder, single episode, unspecified: F32.9

## 2024-03-11 HISTORY — DX: Emphysema, unspecified: J43.9

## 2024-03-11 HISTORY — DX: Presence of coronary angioplasty implant and graft: Z95.5

## 2024-03-11 HISTORY — DX: Unspecified mental disorder due to known physiological condition: F09

## 2024-03-11 HISTORY — DX: Insomnia, unspecified: G47.00

## 2024-03-11 HISTORY — DX: Male erectile dysfunction, unspecified: N52.9

## 2024-03-11 HISTORY — DX: Other sequelae of cerebral infarction: I69.398

## 2024-03-17 NOTE — Progress Notes (Signed)
 RN spoke with patient and reviewed upcoming steps for fiducial's, spaceOAR, CT Sim and MRI.   All questions answered.    Patient received a letter regarding his funding assistance being discontinued for Orgovyx which he receives through [facility].   RN left message with Tinnie, pharmacy tech, at [facility] to assist.   Will continue to follow.

## 2024-03-20 ENCOUNTER — Ambulatory Visit: Admitting: Radiation Oncology

## 2024-03-20 ENCOUNTER — Ambulatory Visit (HOSPITAL_COMMUNITY)

## 2024-03-21 NOTE — Progress Notes (Signed)
 Patient has been notified through [facility] that his coverage for Orgovyx remains active.  No additional needs at this time.

## 2024-03-27 DIAGNOSIS — F331 Major depressive disorder, recurrent, moderate: Secondary | ICD-10-CM | POA: Diagnosis not present

## 2024-03-27 DIAGNOSIS — F411 Generalized anxiety disorder: Secondary | ICD-10-CM | POA: Diagnosis not present

## 2024-03-31 NOTE — Pre-Procedure Instructions (Signed)
 Surgical Instructions   Your procedure is scheduled on Thursday, July 31st. Report to [facility] Main Entrance A at 06:30 A.M., then check in with the Admitting office. Any questions or running late day of surgery: call [phone]  Questions prior to your surgery date: call [phone], Monday-Friday, 8am-4pm. If you experience any cold or flu symptoms such as cough, fever, chills, shortness of breath, etc. between now and your scheduled surgery, please notify us  at the above number.     Remember:  Do not eat or drink after midnight the night before your surgery    Take these medicines the morning of surgery with A SIP OF WATER  amLODipine  (NORVASC )  atorvastatin  (LIPITOR)  busPIRone (BUSPAR)  fluticasone-salmeterol (ADVAIR)  lamoTRIgine (LAMICTAL)  metoprolol  succinate (TOPROL  XL)  lithium  carbonate (LITHOBID)  tiotropium (SPIRIVA)  Tiotropium Bromide Monohydrate (SPIRIVA RESPIMAT)   May take these medicines IF NEEDED: albuterol  (VENTOLIN  HFA)- bring inhaler with you on day of surgery nitroGLYCERIN (NITROSTAT)- If you have to take this medication prior to surgery, please call [phone] and report this to a nurse    Follow your surgeon's instructions on when to stop Aspirin .  If no instructions were given by your surgeon then you will need to call the office to get those instructions.    One week prior to surgery, STOP taking any Aleve, Naproxen, Ibuprofen, Motrin, Advil, Goody's, BC's, all herbal medications, fish oil, and non-prescription vitamins.                     Do NOT Smoke (Tobacco/Vaping) for 24 hours prior to your procedure.  If you use a CPAP at night, you may bring your mask/headgear for your overnight stay.   You will be asked to remove any contacts, glasses, piercing's, hearing aid's, dentures/partials prior to surgery. Please bring cases for these items if needed.    Patients discharged the day of surgery will not be allowed to drive home, and  someone needs to stay with them for 24 hours.  SURGICAL WAITING ROOM VISITATION Patients may have no more than 2 support people in the waiting area - these visitors may rotate.   Pre-op nurse will coordinate an appropriate time for 1 ADULT support person, who may not rotate, to accompany patient in pre-op.  Children under the age of 27 must have an adult with them who is not the patient and must remain in the main waiting area with an adult.  If the patient needs to stay at the hospital during part of their recovery, the visitor guidelines for inpatient rooms apply.  Please refer to the [facility] website for the visitor guidelines for any additional information.   If you received a COVID test during your pre-op visit  it is requested that you wear a mask when out in public, stay away from anyone that may not be feeling well and notify your surgeon if you develop symptoms. If you have been in contact with anyone that has tested positive in the last 10 days please notify you surgeon.      Pre-operative CHG Bathing Instructions   You can play a key role in reducing the risk of infection after surgery. Your skin needs to be as free of germs as possible. You can reduce the number of germs on your skin by washing with CHG (chlorhexidine  gluconate) soap before surgery. CHG is an antiseptic soap that kills germs and continues to kill germs even after washing.   DO NOT use if  you have an allergy to chlorhexidine /CHG or antibacterial soaps. If your skin becomes reddened or irritated, stop using the CHG and notify one of our RNs at [phone].              TAKE A SHOWER THE NIGHT BEFORE SURGERY AND THE DAY OF SURGERY    Please keep in mind the following:  DO NOT shave, including legs and underarms, 48 hours prior to surgery.   You may shave your face before/day of surgery.  Place clean sheets on your bed the night before surgery Use a clean washcloth (not used since being washed) for each  shower. DO NOT sleep with pet's night before surgery.  CHG Shower Instructions:  Wash your face and private area with normal soap. If you choose to wash your hair, wash first with your normal shampoo.  After you use shampoo/soap, rinse your hair and body thoroughly to remove shampoo/soap residue.  Turn the water OFF and apply half the bottle of CHG soap to a CLEAN washcloth.  Apply CHG soap ONLY FROM YOUR NECK DOWN TO YOUR TOES (washing for 3-5 minutes)  DO NOT use CHG soap on face, private areas, open wounds, or sores.  Pay special attention to the area where your surgery is being performed.  If you are having back surgery, having someone wash your back for you may be helpful. Wait 2 minutes after CHG soap is applied, then you may rinse off the CHG soap.  Pat dry with a clean towel  Put on clean pajamas    Additional instructions for the day of surgery: DO NOT APPLY any lotions, deodorants, cologne, or perfumes.   Do not wear jewelry or makeup Do not wear nail polish, gel polish, artificial nails, or any other type of covering on natural nails (fingers and toes) Do not bring valuables to the hospital. [facility] is not responsible for valuables/personal belongings. Put on clean/comfortable clothes.  Please brush your teeth.  Ask your nurse before applying any prescription medications to the skin.

## 2024-04-01 ENCOUNTER — Encounter (HOSPITAL_COMMUNITY)
Admission: RE | Admit: 2024-04-01 | Discharge: 2024-04-01 | Disposition: A | Discharge status: Home / Self Care | Source: Ambulatory Visit | Attending: Urology | Admitting: Urology

## 2024-04-01 ENCOUNTER — Encounter (HOSPITAL_COMMUNITY): Payer: Self-pay

## 2024-04-01 VITALS — BP 158/73 | HR 64 | Temp 97.9°F | Resp 18 | Ht 66.0 in | Wt 140.0 lb

## 2024-04-01 DIAGNOSIS — J449 Chronic obstructive pulmonary disease, unspecified: Secondary | ICD-10-CM | POA: Insufficient documentation

## 2024-04-01 DIAGNOSIS — Z87891 Personal history of nicotine dependence: Secondary | ICD-10-CM | POA: Insufficient documentation

## 2024-04-01 DIAGNOSIS — I10 Essential (primary) hypertension: Secondary | ICD-10-CM | POA: Insufficient documentation

## 2024-04-01 DIAGNOSIS — Z01812 Encounter for preprocedural laboratory examination: Secondary | ICD-10-CM | POA: Insufficient documentation

## 2024-04-01 DIAGNOSIS — I252 Old myocardial infarction: Secondary | ICD-10-CM | POA: Diagnosis not present

## 2024-04-01 DIAGNOSIS — I739 Peripheral vascular disease, unspecified: Secondary | ICD-10-CM | POA: Insufficient documentation

## 2024-04-01 DIAGNOSIS — Z8673 Personal history of transient ischemic attack (TIA), and cerebral infarction without residual deficits: Secondary | ICD-10-CM | POA: Insufficient documentation

## 2024-04-01 DIAGNOSIS — E785 Hyperlipidemia, unspecified: Secondary | ICD-10-CM | POA: Insufficient documentation

## 2024-04-01 DIAGNOSIS — Z955 Presence of coronary angioplasty implant and graft: Secondary | ICD-10-CM | POA: Insufficient documentation

## 2024-04-01 DIAGNOSIS — Z01818 Encounter for other preprocedural examination: Secondary | ICD-10-CM

## 2024-04-01 DIAGNOSIS — I2510 Atherosclerotic heart disease of native coronary artery without angina pectoris: Secondary | ICD-10-CM | POA: Insufficient documentation

## 2024-04-01 DIAGNOSIS — Z9981 Dependence on supplemental oxygen: Secondary | ICD-10-CM | POA: Insufficient documentation

## 2024-04-01 DIAGNOSIS — C61 Malignant neoplasm of prostate: Secondary | ICD-10-CM | POA: Diagnosis not present

## 2024-04-01 LAB — BASIC METABOLIC PANEL WITH GFR
Anion gap: 8 (ref 5–15)
BUN: 7 mg/dL — ABNORMAL LOW (ref 8–23)
CO2: 26 mmol/L (ref 22–32)
Calcium: 9 mg/dL (ref 8.9–10.3)
Chloride: 99 mmol/L (ref 98–111)
Creatinine, Ser: 0.85 mg/dL (ref 0.61–1.24)
GFR, Estimated: >60 mL/min (ref >60)
Glucose, Bld: 109 mg/dL — ABNORMAL HIGH (ref 70–99)
Potassium: 4.2 mmol/L (ref 3.5–5.1)
Sodium: 133 mmol/L — ABNORMAL LOW (ref 135–145)

## 2024-04-01 LAB — CBC
HCT: 45.2 % (ref 39.0–52.0)
Hemoglobin: 15 g/dL (ref 13.0–17.0)
MCH: 29.7 pg (ref 26.0–34.0)
MCHC: 33.2 g/dL (ref 30.0–36.0)
MCV: 89.5 fL (ref 80.0–100.0)
Platelets: 267 K/uL (ref 150–400)
RBC: 5.05 MIL/uL (ref 4.22–5.81)
RDW: 13 % (ref 11.5–15.5)
WBC: 7 K/uL (ref 4.0–10.5)
nRBC: 0 % (ref 0.0–0.2)

## 2024-04-01 NOTE — Progress Notes (Signed)
 Anesthesia APP PAT Evaluation::  Case: [id number] Date/Time: 04/03/24 0815   Procedures:      INSERTION, GOLD SEEDS - GOLD SEED IMPLANTS WITH SPACEOAR     INJECTION, HYDROGEL SPACER   Anesthesia type: Monitor Anesthesia Care   Diagnosis: Prostate cancer (HCC) [C61]   Pre-op diagnosis: PROSTATE CANCER   [facility] ROOM 08 / [facility]   Surgeons: Elisabeth Valli BIRCH, MD       DISCUSSION: Patient is a 78 year old male scheduled for the above procedure.  Surgery was initially scheduled for 03/11/2024 but he had been seen for recent bronchitis.  RN had previously reviewed with anesthesiologist Erma Simmonds, DO (see Progress Note by Francia Aquas, RN from 03/05/24).   History includes former smoker (quit 01/01/17), COPD (3L O2 as needed), HTN, HLD, CAD (inferior STEMI, s/p RCA stent 01/10/05), PAD (bilateral CIA stents), CVA (2012 & 2016; TIA 201316 & likely 04/2020), exertional dyspnea, prostate cancer.   I evaluated Terry Kemp at his 04/01/24 PAT visit.  Following treatment of bronchitis, he feels at his baseline for at least the past 2 weeks. He denied SOB at rest. He has stable DOE with prolonged walking, but was able to walk down the hallway from hospital entrance to PAT without issues. He has to go up and down a flight of stairs to his baseline several times a day. He he does this 2-3 times in a row then he will use his home O2 at 3L/[place]. He uses home O2 only as needed with more strenuous or prolonged exertion. He denied chest pain. He has not needed nitroglycerin.  He denied edema, palpitations, syncope, new dyspnea. Last cardiology visit was on 05/30/22 with Vannie Mora, NP. He had a low risk/non-ischemic stress test, EF 68% in April 2023.   Last ASA on 03/27/24.   He is at his baseline from a respiratory standpoint and described METS tolerance of > 4 without CV symptoms. He appears well on exam. Lung sounds somewhat diminished in LUL, but otherwise lung clear without wheezes, crackles or rhonchi.  Heart RRR. I did not appreciate a murmur. No edema noted.   Anesthesia team to re-evaluated on the day of surgery.    VS: BP (!) 158/73   Pulse 64   Temp 36.6 C   Resp 18   Ht 5' 6 (1.676 m)   Wt 63.5 kg   SpO2 95%   BMI 22.60 kg/m  Heart   PROVIDERS: Okey Carlin Redbird, MD is PCP, although he says he primarily follows with his PCP at the [facility] Bonni Clay, Alm, MD is cardiologist. Last APP evaluation in 2023. Elisabeth Valli, MD is urologist Rosemarie Rothman, MD is neurologist. Last APP evaluation in 04/2021.    LABS: Labs reviewed: Acceptable for surgery. (all labs ordered are listed, but only abnormal results are displayed)  Labs Reviewed  BASIC METABOLIC PANEL WITH GFR - Abnormal; Notable for the following components:      Result Value   Sodium 133 (*)    Glucose, Bld 109 (*)    BUN 7 (*)    All other components within normal limits  CBC    IMAGES: CT Chest LCS 01/16/24 ([facility]): IMPRESSION:  New 5 mm nodule within the left upper lobe.  RECOMMENDATIONS:  6 month follow-up with chest CT (exam code [id number]).  ASSESSMENT: Lung-RADSTM CATEGORY: 3-Probably benign   MRI Prostate 11/20/23: IMPRESSION: 1. Large PI-RADS category 5 lesion of the left peripheral zone with transcapsular spread tumor and left neurovascular  bundle involvement. Targeting data sent to [facility]. 2. Benign prostatic hypertrophy. 3. Atheromatous vascular calcifications. Lack patency of the left common iliac artery and left external iliac artery distal to the stent, the external iliac artery appears reconstituted distally.   CTA Chest 10/15/23 ([facility]): IMPRESSION:  1. No significant pulmonary embolus identified.  2. Mild/moderate atherosclerosis, including coronary artery calcifications.  3. Mild/moderate emphysema. No acute infiltrate or effusion.    EKG: 10/15/23 ([facility]): Tracing scanned under Media tab. Per Result Narrative in [facility]: Diagnosis Normal sinus rhythm  Nonspecific  ST abnormality  Abnormal ECG  No previous ECGs available  Badal, Madan ([id number]) on 10/15/2023 3:05:06 PM certifies that he/she has reviewed the ECG tracing and confirms the independent  interpretation is  correct.    CV: Nuclear stress test 12/26/21:   The study is normal. The study is low risk.   No ST deviation was noted.   LV perfusion is normal.   Left ventricular function is normal. Nuclear stress EF: 68 %. The left ventricular ejection fraction is hyperdynamic (>65%). End diastolic cavity size is normal.   Prior study available for comparison from 11/16/2014.  Echo 12/21/21: IMPRESSIONS   1. Left ventricular ejection fraction, by estimation, is 65 to 70%. Left  ventricular ejection fraction by 3D volume is 66 %. The left ventricle has  normal function. The left ventricle has no regional wall motion  abnormalities. Left ventricular diastolic   parameters are consistent with Grade I diastolic dysfunction (impaired  relaxation). The average left ventricular global longitudinal strain is  -18.6 %. The global longitudinal strain is normal.   2. Right ventricular systolic function is normal. The right ventricular  size is normal. Tricuspid regurgitation signal is inadequate for assessing  PA pressure.   3. The mitral valve is grossly normal. No evidence of mitral valve  regurgitation.   4. The aortic valve is tricuspid. Aortic valve regurgitation is not  visualized.   5. The inferior vena cava is normal in size with greater than 50%  respiratory variability, suggesting right atrial pressure of 3 mmHg.  - Comparison(s): Prior images unable to be directly viewed, comparison made  by report only. Changes from prior study are noted. 10/05/2015: LVEF  60-65%.   Cardiac cath 07/17/07: 1.  Coronary artery disease with 40% distal left main stenosis and 80% stenosis in the right coronary artery distally just beyond a widely patent right coronary stent that has a component vasospasm as it was  reduced to 30-40% aftre intracoronary nitroglycerin. 2.  Normal left ventricular systolic function with ejection fraction of 63%. 3.  Widely patent stents to the left and right common iliacs.   US  Carotid 01/26/05: Right and left ICAs: 0 to 49%'s diameter reduction, velocities suggest lower end of scale. Right vertebral artery: Normal antegrade flow.   Left vertebral artery: Abnormal antegrade flow consistent with left subclavian artery stenosis. Right subclavian artery: Less than 50% diameter reduction. Left subclavian artery: Greater than 50% diameter reduction.   Past Medical History:  Diagnosis Date   Arthritis    Benign localized prostatic hyperplasia with lower urinary tract symptoms (LUTS)    Bronchitis 02/20/2024   pt sent to [facility] 02-20-2024 note in care everywhere;   cough/ chest congestion/ wheezing/ SOB x4 days/ hx COPD w/ oxygen---  dx bronchitis,  stated w/ similar symptoms 02-02-2024 @ [facility] tx'd w/ doxy & steroids symtpoms improved but not 100 % per pt-- at this visit pt given Zpack/ augmentin/ Medro pack to  follow up w/ PCP 4-6 weeks   CAD S/P percutaneous coronary angioplasty 01/2005   cardiologist---- dr anner;  05/ 2006  STEMI ~CATH/PCI- midRCA (Cypher DES 3 x 28 - 3.35mm); CATH 11/ 2008-- INFERIOR ISCHEMIA - 80% dRCA ISR reduced to 40% with NTG - spasm.;  NUC 12-26-2021 LR w/ no ischemia, nuclear EF 68%   Cognitive disorder    mild neurocongnitive disorder   COPD mixed type (HCC)    Long-term smoker.  Managed [facility] [place] [facility]-- started seeing 04/ 2025 (note 12-06-2023 in care everywhere)   Dependence on supplemental oxygen 12/2023   followed by [facility] pulmolonary ;  started O2 by 2L-3L via [place] due to DOE   Disintegration of vascular stent    DOE (dyspnea on exertion)    ED (erectile dysfunction)    Emphysema lung (HCC)    severe [facility] ,  on oxygen   GAD (generalized anxiety disorder)    History of cerebrovascular accident (CVA) with  residual deficit 2012   a) 10/ 2012: CVA although not in MRI imaging felt to be small braine stem infarct;; b) 08/24/2015: Imaging confirmed CVA - mild residual R-sided weakness;   and other remote lacunar infarts per imaging   History of ST elevation myocardial infarction (STEMI) 01/2005   cath w/ intervention   Hyperlipidemia    Hypertension    Impaired balance as late effect of cerebrovascular accident    Insomnia    Late, effect, cerebrovascular disease 2012   neurologist--- dr rosemarie  graciela in epic 04-25-2021;  10/ 2012 CVA small brain stem infart while in [facility];  2013  TIA;   12/ 2016 silent left IC infarct & small subcortical infart;   possible TIA 2021 due to symptoms of left hemispheria   Malignant neoplasm prostate (HCC) 01/2024   urologsit--- dr pace/  radiation onologist--- dr patrcia;  dx 05/ 2025,  gleason 4+4/  psa 3.81   MDD (major depressive disorder)    followed by psychiatrist   Myocardial infarction [facility])    PAD (peripheral artery disease) (HCC) 1999   by Dr Eliza (vascular)  bilateral iliac arteries PTA  and  stenting , right side  04/30/1998 left side 01/ 2001; In 2006-subsequent  R Common Iliac (RCIA) 100% ISR, 75 % LCIA -- 08/ 2006 recancalization , PCA, & Stenting to bilateral CIA--  Bifuracation Kissing Stent creating new Aortic Carina.-- Overlapping 7 mm x 39mm & 18mm stents RCIA & kissing 7 mm x 29 mm in LCIA.   S/P drug eluting coronary stent placement    Stroke (HCC)    Weak urinary stream     Past Surgical History:  Procedure Laterality Date   ABDOMINAL AOTROGRAM  07/17/2007   ADDTION WITH CARDIAC CATH---(INFRARENAL WITH BIFEMORAL RUNOFF )----WIDELY PATENT STENTS TO LEFT AND RIGHT COMMON ILIACS   ANGIOPLASTY / STENTING ILIAC Right 04/30/1998   @ [facility] by dr eliza;   right CIA//      09/1999--   left CIA  x3 stents   CARDIAC CATHETERIZATION  07/17/2007   @ [facility] by Dr Lavon;  40% dLM (previously read as 50%); 80% mRCA - distal stent ISR  --> reduced to 40% with NTG = d/to spasm--widely patent bilateral  CIAs   CATARACT EXTRACTION W/ INTRAOCULAR LENS IMPLANT Bilateral 1998   right 1998/   left 2001   COLONOSCOPY  06/2022   CORONARY ANGIOPLASTY WITH STENT PLACEMENT  01/10/2005   @[facility]  by dr gamble;  Inf STEMI: dLM 50% - calcified. mRCA 100% (infaract-related vessel) --> DES PCI; AbdAo-Iliac Angio: PAD R Common Iliac (RCIA) 100% ISR, LCIA 75% ISR @ outflow tract of the left iliac. LVEF~ 60%   LAMINECTOMY WITH POSTERIOR LATERAL ARTHRODESIS LEVEL 1  11/09/2014   @[facility]  by dr colon;   L5--S1   laminectomy/ decompression/ fusion   peripheral abd aortic cath  04/04/2005   @[facility]    by Dr Maye---  LCIA 75% ISR, 100% RCIA ISR: kissing balloon PTA  --> Kissing STENTs Bilateral CIA (RCIA 2 overlapping 7.0 x 39 mm & 7.0 x 18 mm Gensis stents, LCIA - 7mm x 29 mm Gensis stent (noted to be patent in 2008 Cath)    MEDICATIONS:  albuterol  (VENTOLIN  HFA) 108 (90 Base) MCG/ACT inhaler   amLODipine  (NORVASC ) 10 MG tablet   aspirin  EC 81 MG tablet   atorvastatin  (LIPITOR) 40 MG tablet   B Complex CAPS   busPIRone (BUSPAR) 10 MG tablet   lamoTRIgine (LAMICTAL) 25 MG tablet   lithium  carbonate (LITHOBID) 300 MG ER tablet   metoprolol  succinate (TOPROL  XL) 25 MG 24 hr tablet   Multiple Vitamin (MULTIVITAMIN) tablet   nicotine  polacrilex (COMMIT) 2 MG lozenge   nitroGLYCERIN (NITROSTAT) 0.4 MG SL tablet   ORGOVYX 120 MG tablet   QUEtiapine (SEROQUEL) 50 MG tablet   ramipril  (ALTACE ) 10 MG capsule   Tiotropium Bromide Monohydrate (SPIRIVA RESPIMAT) 1.25 MCG/ACT AERS   No current facility-administered medications for this encounter.   Isaiah Ruder, PA-C Surgical Short Stay/Anesthesiology [facility] Phone [phone] [facility] Phone [phone] 04/01/2024 4:52 PM

## 2024-04-01 NOTE — Anesthesia Preprocedure Evaluation (Signed)
 Anesthesia Evaluation  Patient identified by MRN, date of birth, ID band Patient awake    Reviewed: Allergy & Precautions, NPO status , Patient's Chart, lab work & pertinent test results, reviewed documented beta blocker date and time   Airway Mallampati: III  TM Distance: >3 FB Neck ROM: Full    Dental  (+) Teeth Intact, Dental Advisory Given, Caps,    Pulmonary COPD,  oxygen dependent, former smoker   Pulmonary exam normal breath sounds clear to auscultation       Cardiovascular hypertension, Pt. on medications and Pt. on home beta blockers + CAD, + Past MI, + Cardiac Stents, + Peripheral Vascular Disease and + DOE  Normal cardiovascular exam Rhythm:Regular Rate:Normal     Neuro/Psych  PSYCHIATRIC DISORDERS Anxiety Depression    TIACVA, No Residual Symptoms    GI/Hepatic negative GI ROS, Neg liver ROS,,,  Endo/Other  negative endocrine ROS    Renal/GU negative Renal ROS   Prostate cancer     Musculoskeletal  (+) Arthritis ,    Abdominal   Peds  Hematology negative hematology ROS (+)   Anesthesia Other Findings Day of surgery medications reviewed with the patient.  Reproductive/Obstetrics                              Anesthesia Physical Anesthesia Plan  ASA: 4  Anesthesia Plan: MAC   Post-op Pain Management: Tylenol  PO (pre-op)*   Induction: Intravenous  PONV Risk Score and Plan: 1 and TIVA and Treatment may vary due to age or medical condition  Airway Management Planned: Natural Airway and Simple Face Mask  Additional Equipment:   Intra-op Plan:   Post-operative Plan:   Informed Consent: I have reviewed the patients History and Physical, chart, labs and discussed the procedure including the risks, benefits and alternatives for the proposed anesthesia with the patient or authorized representative who has indicated his/her understanding and acceptance.     Dental  advisory given  Plan Discussed with: CRNA and Anesthesiologist  Anesthesia Plan Comments: (PAT note written 04/01/2024 by Ellison Leisure, PA-C.  )         Anesthesia Quick Evaluation

## 2024-04-01 NOTE — Progress Notes (Addendum)
 Per nurse at Dr. Gabe office patient is to do enema at home prior to coming for procedure 1520 patient made aware of needing to do enema at home

## 2024-04-01 NOTE — Progress Notes (Signed)
 PCP - Okey Carlin Redbird, MD  Cardiologist - Anner Alm ORN, MD   PPM/ICD - denies Device Orders - n/a Rep Notified - n/a  Chest x-ray -03-21-24  EKG - 10-15-23 Stress Test - 12-26-21 ECHO - 12-21-21 Cardiac Cath - 07-17-07  Sleep Study - denies CPAP - uses 02 after exercise and going up and down stairs 2-3 times  DM -denies  Blood Thinner Instructions:denies Aspirin  Instructions: per patient last dose 03-27-24  ERAS Protcol - PRE-SURGERY Ensure or G2-   COVID TEST- n/a   Anesthesia review: yes Hx, of CAD, HTN, COPD, MI recently had surgery rescheduled due to bronchitis and use of antibiotics. Patient report completion of antibiotics and no longer coughing. Per patient he did not follow up with PCP only spoke with RN at [facility].  Patient denies shortness of breath, fever, cough and chest pain at PAT appointment   All instructions explained to the patient, with a verbal understanding of the material. Patient agrees to go over the instructions while at home for a better understanding. Patient also instructed to self quarantine after being tested for COVID-19. The opportunity to ask questions was provided.

## 2024-04-01 NOTE — Progress Notes (Addendum)
 [facility]CP - [facility] [place]   Cardiologist - [facility]     PPM/ICD - denies Device Orders - n/a Rep Notified - n/a  Chest x-ray -03-21-24  EKG - 10-15-23 Stress Test - 12-26-21 ECHO - 12-21-21 Cardiac Cath - 07-17-07 Sleep Study -   CPAP - denies Uses O2 3L when needed. Per patient uses 02 after exercise and going up and down stairs 2-3 times  DM- denies  Blood Thinner Instructions: denies Aspirin  Instructions: Per patient last dose 03-27-24  ERAS Protcol -NPO   COVID TEST- n/a   Anesthesia review:  Yes, Hx of CAD, HTN, COPD, MI, recently had surgery rescheduled due to bronchitis and use of antibiotics. Patient reports completions of antibiotics and no longer coughing. Per patient he did not follow up with PCP only spoke with RN at [facility].   Patient denies shortness of breath, fever, cough and chest pain at PAT appointment   All instructions explained to the patient, with a verbal understanding of the material. Patient agrees to go over the instructions while at home for a better understanding. Patient also instructed to self quarantine after being tested for COVID-19. The opportunity to ask questions was provided.

## 2024-04-03 ENCOUNTER — Ambulatory Visit (HOSPITAL_COMMUNITY)
Admission: RE | Admit: 2024-04-03 | Discharge: 2024-04-03 | Disposition: A | Discharge status: Home / Self Care | Attending: Urology | Admitting: Urology

## 2024-04-03 ENCOUNTER — Ambulatory Visit (HOSPITAL_COMMUNITY): Payer: Self-pay | Admitting: Vascular Surgery

## 2024-04-03 ENCOUNTER — Ambulatory Visit (HOSPITAL_COMMUNITY): Payer: Self-pay | Admitting: Certified Registered"

## 2024-04-03 ENCOUNTER — Ambulatory Visit

## 2024-04-03 ENCOUNTER — Encounter (HOSPITAL_COMMUNITY)
Admission: RE | Disposition: A | Discharge status: Home / Self Care | Payer: Self-pay | Source: Home / Self Care | Attending: Urology

## 2024-04-03 DIAGNOSIS — R0609 Other forms of dyspnea: Secondary | ICD-10-CM | POA: Insufficient documentation

## 2024-04-03 DIAGNOSIS — C61 Malignant neoplasm of prostate: Secondary | ICD-10-CM | POA: Diagnosis not present

## 2024-04-03 DIAGNOSIS — Z87891 Personal history of nicotine dependence: Secondary | ICD-10-CM | POA: Insufficient documentation

## 2024-04-03 DIAGNOSIS — F32A Depression, unspecified: Secondary | ICD-10-CM | POA: Insufficient documentation

## 2024-04-03 DIAGNOSIS — Z9981 Dependence on supplemental oxygen: Secondary | ICD-10-CM | POA: Diagnosis not present

## 2024-04-03 DIAGNOSIS — M1990 Unspecified osteoarthritis, unspecified site: Secondary | ICD-10-CM | POA: Diagnosis not present

## 2024-04-03 DIAGNOSIS — I25119 Atherosclerotic heart disease of native coronary artery with unspecified angina pectoris: Secondary | ICD-10-CM

## 2024-04-03 DIAGNOSIS — J449 Chronic obstructive pulmonary disease, unspecified: Secondary | ICD-10-CM | POA: Insufficient documentation

## 2024-04-03 DIAGNOSIS — F419 Anxiety disorder, unspecified: Secondary | ICD-10-CM | POA: Diagnosis not present

## 2024-04-03 DIAGNOSIS — I739 Peripheral vascular disease, unspecified: Secondary | ICD-10-CM | POA: Diagnosis not present

## 2024-04-03 DIAGNOSIS — I252 Old myocardial infarction: Secondary | ICD-10-CM | POA: Diagnosis not present

## 2024-04-03 DIAGNOSIS — I2510 Atherosclerotic heart disease of native coronary artery without angina pectoris: Secondary | ICD-10-CM | POA: Diagnosis not present

## 2024-04-03 DIAGNOSIS — I10 Essential (primary) hypertension: Secondary | ICD-10-CM | POA: Insufficient documentation

## 2024-04-03 DIAGNOSIS — Z955 Presence of coronary angioplasty implant and graft: Secondary | ICD-10-CM | POA: Insufficient documentation

## 2024-04-03 HISTORY — PX: GOLD SEED IMPLANT: SHX6343

## 2024-04-03 HISTORY — PX: SPACE OAR INSTILLATION: SHX6769

## 2024-04-03 SURGERY — INSERTION, GOLD SEEDS
Anesthesia: Monitor Anesthesia Care

## 2024-04-03 MED ORDER — LIDOCAINE 2% (20 MG/ML) 5 ML SYRINGE
INTRAMUSCULAR | Status: DC | PRN
  Administered 2024-04-03: 40 mg via INTRAVENOUS

## 2024-04-03 MED ORDER — SODIUM CHLORIDE (PF) 0.9 % IJ SOLN
INTRAMUSCULAR | Status: AC
  Filled 2024-04-03: qty 10

## 2024-04-03 MED ORDER — PROPOFOL 10 MG/ML IV BOLUS
INTRAVENOUS | Status: AC
  Filled 2024-04-03: qty 20

## 2024-04-03 MED ORDER — LIDOCAINE HCL (PF) 1 % IJ SOLN
INTRAMUSCULAR | Status: AC
  Filled 2024-04-03: qty 30

## 2024-04-03 MED ORDER — ROCURONIUM BROMIDE 10 MG/ML (PF) SYRINGE
PREFILLED_SYRINGE | INTRAVENOUS | Status: AC
  Filled 2024-04-03: qty 10

## 2024-04-03 MED ORDER — LIDOCAINE 2% (20 MG/ML) 5 ML SYRINGE
INTRAMUSCULAR | Status: AC
  Filled 2024-04-03: qty 5

## 2024-04-03 MED ORDER — ACETAMINOPHEN 500 MG PO TABS
1000.0000 mg | ORAL_TABLET | Freq: Once | ORAL | Status: AC
  Administered 2024-04-03: 1000 mg via ORAL
  Filled 2024-04-03: qty 2

## 2024-04-03 MED ORDER — PROPOFOL 500 MG/50ML IV EMUL
INTRAVENOUS | Status: DC | PRN
Start: 2024-04-03 — End: 2024-04-03
  Administered 2024-04-03: 100 ug/kg/min via INTRAVENOUS

## 2024-04-03 MED ORDER — ORAL CARE MOUTH RINSE
15.0000 mL | Freq: Once | OROMUCOSAL | Status: AC
Start: 2024-04-03 — End: 2024-04-03

## 2024-04-03 MED ORDER — FLEET ENEMA RE ENEM
1.0000 | ENEMA | Freq: Once | RECTAL | Status: AC
  Administered 2024-04-03: 1 via RECTAL
  Filled 2024-04-03: qty 1

## 2024-04-03 MED ORDER — BUPIVACAINE HCL (PF) 0.25 % IJ SOLN
INTRAMUSCULAR | Status: DC | PRN
Start: 2024-04-03 — End: 2024-04-03
  Administered 2024-04-03: 18 mL

## 2024-04-03 MED ORDER — PHENYLEPHRINE 80 MCG/ML (10ML) SYRINGE FOR IV PUSH (FOR BLOOD PRESSURE SUPPORT)
PREFILLED_SYRINGE | INTRAVENOUS | Status: AC
  Filled 2024-04-03: qty 10

## 2024-04-03 MED ORDER — FENTANYL CITRATE (PF) 100 MCG/2ML IJ SOLN
INTRAMUSCULAR | Status: AC
  Filled 2024-04-03: qty 2

## 2024-04-03 MED ORDER — BUPIVACAINE HCL (PF) 0.25 % IJ SOLN
INTRAMUSCULAR | Status: AC
  Filled 2024-04-03: qty 30

## 2024-04-03 MED ORDER — FENTANYL CITRATE (PF) 100 MCG/2ML IJ SOLN
INTRAMUSCULAR | Status: DC | PRN
  Administered 2024-04-03 (×2): 25 ug via INTRAVENOUS

## 2024-04-03 MED ORDER — CHLORHEXIDINE GLUCONATE 0.12 % MT SOLN
15.0000 mL | Freq: Once | OROMUCOSAL | Status: AC
  Administered 2024-04-03: 15 mL via OROMUCOSAL
  Filled 2024-04-03: qty 15

## 2024-04-03 MED ORDER — DEXAMETHASONE SODIUM PHOSPHATE 10 MG/ML IJ SOLN
INTRAMUSCULAR | Status: AC
  Filled 2024-04-03: qty 1

## 2024-04-03 MED ORDER — ONDANSETRON HCL 4 MG/2ML IJ SOLN: 4.0000 mg | Freq: Once | INTRAMUSCULAR | Status: DC | PRN

## 2024-04-03 MED ORDER — ONDANSETRON HCL 4 MG/2ML IJ SOLN
INTRAMUSCULAR | Status: AC
  Filled 2024-04-03: qty 2

## 2024-04-03 MED ORDER — EPHEDRINE 5 MG/ML INJ
INTRAVENOUS | Status: AC
  Filled 2024-04-03: qty 5

## 2024-04-03 MED ORDER — SUGAMMADEX SODIUM 200 MG/2ML IV SOLN
INTRAVENOUS | Status: AC
  Filled 2024-04-03: qty 2

## 2024-04-03 MED ORDER — PROPOFOL 10 MG/ML IV BOLUS
INTRAVENOUS | Status: DC | PRN
  Administered 2024-04-03 (×2): 20 mg via INTRAVENOUS
  Administered 2024-04-03: 40 mg via INTRAVENOUS

## 2024-04-03 MED ORDER — FENTANYL CITRATE (PF) 100 MCG/2ML IJ SOLN: 25.0000 ug | INTRAMUSCULAR | Status: DC | PRN

## 2024-04-03 MED ORDER — FENTANYL CITRATE (PF) 250 MCG/5ML IJ SOLN
INTRAMUSCULAR | Status: AC
  Filled 2024-04-03: qty 5

## 2024-04-03 MED ORDER — CEFAZOLIN SODIUM-DEXTROSE 2-4 GM/100ML-% IV SOLN
2.0000 g | INTRAVENOUS | Status: AC
  Administered 2024-04-03: 2 g via INTRAVENOUS
  Filled 2024-04-03: qty 100

## 2024-04-03 MED ORDER — LACTATED RINGERS IV SOLN: INTRAVENOUS | Status: DC

## 2024-04-03 SURGICAL SUPPLY — 20 items
BLADE CLIPPER SENSICLIP SURGIC (BLADE) ×1 IMPLANT
CNTNR URN SCR LID CUP LEK RST (MISCELLANEOUS) ×1 IMPLANT
COVER BACK TABLE 60X90IN (DRAPES) ×1 IMPLANT
DRSG TEGADERM 4X4.75 (GAUZE/BANDAGES/DRESSINGS) ×1 IMPLANT
DRSG TEGADERM 8X12 (GAUZE/BANDAGES/DRESSINGS) ×1 IMPLANT
GAUZE SPONGE 4X4 12PLY STRL (GAUZE/BANDAGES/DRESSINGS) ×1 IMPLANT
GLOVE BIO SURGEON STRL SZ 6.5 (GLOVE) ×1 IMPLANT
IMPL SPACEOAR SYSTEM 10ML (Spacer) ×1 IMPLANT
MARKER GOLD PRELOAD 1.2X3 (Urological Implant) ×1 IMPLANT
MARKER SKIN DUAL TIP RULER LAB (MISCELLANEOUS) ×1 IMPLANT
NDL SPNL 22GX3.5 QUINCKE BK (NEEDLE) ×1 IMPLANT
NEEDLE SPNL 22GX3.5 QUINCKE BK (NEEDLE) ×1 IMPLANT
SHEATH ULTRASOUND LF (SHEATH) IMPLANT
SHEATH ULTRASOUND LTX NONSTRL (SHEATH) IMPLANT
SLEEVE SCD COMPRESS KNEE MED (STOCKING) ×1 IMPLANT
SURGILUBE 2OZ TUBE FLIPTOP (MISCELLANEOUS) ×1 IMPLANT
SYR 10ML LL (SYRINGE) IMPLANT
SYR CONTROL 10ML LL (SYRINGE) ×1 IMPLANT
TOWEL OR 17X24 6PK STRL BLUE (TOWEL DISPOSABLE) ×1 IMPLANT
UNDERPAD 30X36 HEAVY ABSORB (UNDERPADS AND DIAPERS) ×1 IMPLANT

## 2024-04-03 NOTE — Op Note (Signed)
 Preoperative diagnosis: Clinically localized adenocarcinoma of the prostate   Postoperative diagnosis: Clinically localized adenocarcinoma of the prostate  Procedure: 1) Placement of fiducial markers into prostate                    2) Insertion of SpaceOAR hydrogel   Surgeon: Valli Shank, MD   Anesthesia: General  EBL: Minimal  Complications: None  Indication: Terry Kemp is a 78 y.o. gentleman with clinically localized prostate cancer. After discussing management options for treatment, he elected to proceed with radiotherapy. He presents today for the above procedures. The potential risks, complications, alternative options, and expected recovery course have been discussed in detail with the patient and he has provided informed consent to proceed.  Description of procedure: The patient was administered preoperative antibiotics, placed in the dorsal lithotomy position, and prepped and draped in the usual sterile fashion. Next, transrectal ultrasonography was utilized to visualize the prostate. Three gold fiducial markers were then placed into the prostate via transperineal needles under ultrasound guidance at the right apex, right base, and left mid gland under direct ultrasound guidance. A site in the midline was then selected on the perineum for placement of an 18 g needle with saline. The needle was advanced above the rectum and below Denonvillier's fascia to the mid gland and confirmed to be in the midline on transverse imaging. One cc of saline was injected confirming appropriate expansion of this space. A total of 10 cc of saline was then injected to open the space further bilaterally. The saline syringe was then removed and the SpaceOAR hydrogel was injected with good distribution bilaterally. He tolerated the procedure well and without complications. He was given a voiding trial prior to discharge from the PACU.

## 2024-04-03 NOTE — Transfer of Care (Signed)
 Immediate Anesthesia Transfer of Care Note  Patient: Terry Kemp  Procedure(s) Performed: INSERTION, GOLD SEEDS INJECTION, HYDROGEL SPACER  Patient Location: PACU  Anesthesia Type:General  Level of Consciousness: awake, alert , and oriented  Airway & Oxygen Therapy: Patient Spontanous Breathing  Post-op Assessment: Report given to RN and Post -op Vital signs reviewed and stable  Post vital signs: Reviewed and stable  Last Vitals:  Vitals Value Taken Time  BP 133/73 04/03/24 08:52  Temp    Pulse 59 04/03/24 08:54  Resp 10 04/03/24 08:54  SpO2 91 % 04/03/24 08:54  Vitals shown include unfiled device data.  Last Pain:  Vitals:   04/03/24 0735  TempSrc:   PainSc: 0-No pain         Complications: No notable events documented.

## 2024-04-03 NOTE — Anesthesia Postprocedure Evaluation (Signed)
 Anesthesia Post Note  Patient: Terry Kemp  Procedure(s) Performed: INSERTION, GOLD SEEDS INJECTION, HYDROGEL SPACER     Patient location during evaluation: PACU Anesthesia Type: MAC Level of consciousness: awake and alert Pain management: pain level controlled Vital Signs Assessment: post-procedure vital signs reviewed and stable Respiratory status: spontaneous breathing, nonlabored ventilation and respiratory function stable Cardiovascular status: stable and blood pressure returned to baseline Postop Assessment: no apparent nausea or vomiting Anesthetic complications: no   No notable events documented.  Last Vitals:  Vitals:   04/03/24 0915 04/03/24 0930  BP: (!) 149/73 136/74  Pulse: (!) 58 (!) 58  Resp: 12 11  Temp:  36.5 C  SpO2: 96% 97%    Last Pain:  Vitals:   04/03/24 0930  TempSrc:   PainSc: 0-No pain                 Garnette FORBES Skillern

## 2024-04-03 NOTE — Discharge Instructions (Addendum)
 Transrectal Ultrasound-Guided Prostate Gold Seed and Biodegradable Gel Placement, Care After  What can I expect after the procedure? After the procedure, it is common to have: Pain and discomfort near your butt (rectum), especially while sitting. Bruising or tenderness in the area behind the scrotum (perineum), if the needle was put into your prostate through this area. Pink-colored pee (urine). This is due to small amounts of blood in your pee. A burning feeling while peeing. Blood in your semen. Follow these instructions at home: Medicines Take over-the-counter and prescription medicines only as told by your doctor. If you were given a sedative during your procedure, do not drive or use machines until your doctor says that it is safe. A sedative is a medicine that helps you relax. If you were prescribed an antibiotic medicine, take it as told by your doctor. Do not stop taking it even if you start to feel better. Activity  Return to your normal activities when your doctor says that it is safe. Ask your doctor when it is okay for you to have sex. Avoid strenuous activity and heavy lifting greater than 10-15 lbs for 2-3 days.  General instructions  Drink enough water to keep your pee pale yellow. Watch your pee, poop, and semen for new bleeding or bleeding that gets worse. Keep all follow-up visits. Contact a doctor if: You have any of these: Blood clots in your pee or poop. New or worse bleeding in your pee, poop, or semen. Very bad belly pain. Your pee smells bad or unusual. You have trouble peeing. Your lower belly feels firm. You have problems getting an erection. You feel like you may vomit (are nauseous), or you vomit. Get help right away if: You have a fever or chills. You have bright red pee. You have very bad pain that does not get better with medicine. You cannot pee. Summary After this procedure, it is common to have pain and discomfort near your butt, especially  while sitting. You may have blood in your pee and poop. It is common to have blood in your semen. Get help right away if you have a fever or chills.  This information is not intended to replace advice given to you by your health care provider. Make sure you discuss any questions you have with your health care provider. The following information offers guidance on how to care for yourself after your procedure. Your health care provider may also give you more specific instructions. If you have problems or questions, contact your health care provider.

## 2024-04-03 NOTE — Interval H&P Note (Signed)
 History and Physical Interval Note:  04/03/2024 7:50 AM  Terry Kemp  has presented today for surgery, with the diagnosis of PROSTATE CANCER.  The various methods of treatment have been discussed with the patient and family. After consideration of risks, benefits and other options for treatment, the patient has consented to  Procedure(s) with comments: INSERTION, GOLD SEEDS (N/A) - GOLD SEED IMPLANTS WITH SPACEOAR INJECTION, HYDROGEL SPACER (N/A) as a surgical intervention.  The patient's history has been reviewed, patient examined, no change in status, stable for surgery.  I have reviewed the patient's chart and labs.  Questions were answered to the patient's satisfaction.     Saleah Rishel D Hattye Siegfried

## 2024-04-04 ENCOUNTER — Ambulatory Visit

## 2024-04-04 ENCOUNTER — Encounter (HOSPITAL_COMMUNITY): Payer: Self-pay | Admitting: Urology

## 2024-04-07 ENCOUNTER — Ambulatory Visit

## 2024-04-07 ENCOUNTER — Ambulatory Visit (HOSPITAL_COMMUNITY): Admission: RE | Admit: 2024-04-07 | Source: Ambulatory Visit

## 2024-04-07 ENCOUNTER — Ambulatory Visit: Admitting: Radiation Oncology

## 2024-04-08 ENCOUNTER — Telehealth: Payer: Self-pay | Admitting: *Deleted

## 2024-04-08 ENCOUNTER — Ambulatory Visit

## 2024-04-08 NOTE — Telephone Encounter (Signed)
 CALLED PATIENT TO INFORM THAT MRI HAS BEEN MOVED TO 04-09-24- ARRIVAL TIME- 3:45 PM @ [facility], PATIENT TO BE NPO- 4 HRS. PRIOR TO SCAN, SPOKE WITH PATIENT AND HE IS AWARE OF THIS SCAN AND THE INSTRUCTIONS

## 2024-04-09 ENCOUNTER — Ambulatory Visit (HOSPITAL_COMMUNITY)
Admission: RE | Admit: 2024-04-09 | Discharge: 2024-04-09 | Disposition: A | Discharge status: Home / Self Care | Source: Ambulatory Visit | Attending: Urology | Admitting: Urology

## 2024-04-09 ENCOUNTER — Ambulatory Visit
Admission: RE | Admit: 2024-04-09 | Discharge: 2024-04-09 | Disposition: A | Discharge status: Home / Self Care | Source: Ambulatory Visit | Attending: Radiation Oncology | Admitting: Radiation Oncology

## 2024-04-09 ENCOUNTER — Ambulatory Visit

## 2024-04-09 DIAGNOSIS — C61 Malignant neoplasm of prostate: Secondary | ICD-10-CM | POA: Insufficient documentation

## 2024-04-09 DIAGNOSIS — Z510 Encounter for antineoplastic radiation therapy: Secondary | ICD-10-CM | POA: Diagnosis not present

## 2024-04-09 DIAGNOSIS — Z191 Hormone sensitive malignancy status: Secondary | ICD-10-CM | POA: Diagnosis not present

## 2024-04-09 NOTE — Progress Notes (Signed)
  Radiation Oncology         [phone]) [phone] ________________________________  Name: XANE AMSDEN MRN: [id number]  Date: 04/09/2024  DOB: [birth year]  SIMULATION AND TREATMENT PLANNING NOTE    ICD-10-CM   1. Malignant neoplasm of prostate (HCC)  C61       DIAGNOSIS:  78 y.o. gentleman with Stage T1c adenocarcinoma of the prostate with Gleason score of 4+4, and PSA of 3.81.   NARRATIVE:  The patient was brought to the CT Simulation planning suite.  Identity was confirmed.  All relevant records and images related to the planned course of therapy were reviewed.  The patient freely provided informed written consent to proceed with treatment after reviewing the details related to the planned course of therapy. The consent form was witnessed and verified by the simulation staff.  Then, the patient was set-up in a stable reproducible supine position for radiation therapy.  A vacuum lock pillow device was custom fabricated to position his legs in a reproducible immobilized position.  Then, I performed a urethrogram under sterile conditions to identify the prostatic apex.  CT images were obtained.  Surface markings were placed.  The CT images were loaded into the planning software.  Then the prostate target and avoidance structures including the rectum, bladder, bowel and hips were contoured.  Treatment planning then occurred.  The radiation prescription was entered and confirmed.  A total of one complex treatment devices was fabricated. I have requested : Intensity Modulated Radiotherapy (IMRT) is medically necessary for this case for the following reason:  Rectal sparing.SABRA  PLAN:   The prostate, seminal vesicles, and pelvic lymph nodes will initially be treated to 45 Gy in 25 fractions of 1.8 Gy followed by a boost to the prostate only, to 75 Gy with 15 additional fractions of 2.0 Gy, concurrent with ADT (started Orgovyx ADT 02/06/24).   ________________________________  Donnice FELIX Patrcia, M.D.

## 2024-04-10 ENCOUNTER — Ambulatory Visit

## 2024-04-11 ENCOUNTER — Ambulatory Visit

## 2024-04-14 ENCOUNTER — Ambulatory Visit

## 2024-04-15 ENCOUNTER — Ambulatory Visit

## 2024-04-15 DIAGNOSIS — Z510 Encounter for antineoplastic radiation therapy: Secondary | ICD-10-CM | POA: Diagnosis not present

## 2024-04-15 DIAGNOSIS — C61 Malignant neoplasm of prostate: Secondary | ICD-10-CM | POA: Diagnosis not present

## 2024-04-15 DIAGNOSIS — Z191 Hormone sensitive malignancy status: Secondary | ICD-10-CM | POA: Diagnosis not present

## 2024-04-16 ENCOUNTER — Other Ambulatory Visit: Payer: Self-pay

## 2024-04-16 ENCOUNTER — Ambulatory Visit
Admission: RE | Admit: 2024-04-16 | Discharge: 2024-04-16 | Disposition: A | Discharge status: Home / Self Care | Source: Ambulatory Visit | Attending: Radiation Oncology | Admitting: Radiation Oncology

## 2024-04-16 DIAGNOSIS — Z191 Hormone sensitive malignancy status: Secondary | ICD-10-CM | POA: Diagnosis not present

## 2024-04-16 DIAGNOSIS — C61 Malignant neoplasm of prostate: Secondary | ICD-10-CM | POA: Diagnosis not present

## 2024-04-16 DIAGNOSIS — Z510 Encounter for antineoplastic radiation therapy: Secondary | ICD-10-CM | POA: Diagnosis not present

## 2024-04-16 LAB — RAD ONC ARIA SESSION SUMMARY
Course Elapsed Days: 0
Plan Fractions Treated to Date: 1
Plan Prescribed Dose Per Fraction: 1.8 Gy
Plan Total Fractions Prescribed: 25
Plan Total Prescribed Dose: 45 Gy
Reference Point Dosage Given to Date: 1.8 Gy
Reference Point Session Dosage Given: 1.8 Gy
Session Number: 1

## 2024-04-17 ENCOUNTER — Other Ambulatory Visit: Payer: Self-pay

## 2024-04-17 ENCOUNTER — Ambulatory Visit
Admission: RE | Admit: 2024-04-17 | Discharge: 2024-04-17 | Disposition: A | Discharge status: Home / Self Care | Source: Ambulatory Visit | Attending: Radiation Oncology | Admitting: Radiation Oncology

## 2024-04-17 DIAGNOSIS — Z510 Encounter for antineoplastic radiation therapy: Secondary | ICD-10-CM | POA: Diagnosis not present

## 2024-04-17 DIAGNOSIS — Z191 Hormone sensitive malignancy status: Secondary | ICD-10-CM | POA: Diagnosis not present

## 2024-04-17 DIAGNOSIS — C61 Malignant neoplasm of prostate: Secondary | ICD-10-CM | POA: Diagnosis not present

## 2024-04-17 LAB — RAD ONC ARIA SESSION SUMMARY
Course Elapsed Days: 1
Plan Fractions Treated to Date: 2
Plan Prescribed Dose Per Fraction: 1.8 Gy
Plan Total Fractions Prescribed: 25
Plan Total Prescribed Dose: 45 Gy
Reference Point Dosage Given to Date: 3.6 Gy
Reference Point Session Dosage Given: 1.8 Gy
Session Number: 2

## 2024-04-18 ENCOUNTER — Ambulatory Visit
Admission: RE | Admit: 2024-04-18 | Discharge: 2024-04-18 | Disposition: A | Discharge status: Home / Self Care | Source: Ambulatory Visit | Attending: Radiation Oncology | Admitting: Radiation Oncology

## 2024-04-18 ENCOUNTER — Other Ambulatory Visit: Payer: Self-pay

## 2024-04-18 DIAGNOSIS — Z191 Hormone sensitive malignancy status: Secondary | ICD-10-CM | POA: Diagnosis not present

## 2024-04-18 DIAGNOSIS — Z510 Encounter for antineoplastic radiation therapy: Secondary | ICD-10-CM | POA: Diagnosis not present

## 2024-04-18 DIAGNOSIS — C61 Malignant neoplasm of prostate: Secondary | ICD-10-CM | POA: Diagnosis not present

## 2024-04-18 LAB — RAD ONC ARIA SESSION SUMMARY
Course Elapsed Days: 2
Plan Fractions Treated to Date: 3
Plan Prescribed Dose Per Fraction: 1.8 Gy
Plan Total Fractions Prescribed: 25
Plan Total Prescribed Dose: 45 Gy
Reference Point Dosage Given to Date: 5.4 Gy
Reference Point Session Dosage Given: 1.8 Gy
Session Number: 3

## 2024-04-21 ENCOUNTER — Other Ambulatory Visit: Payer: Self-pay

## 2024-04-21 ENCOUNTER — Ambulatory Visit
Admission: RE | Admit: 2024-04-21 | Discharge: 2024-04-21 | Disposition: A | Discharge status: Home / Self Care | Source: Ambulatory Visit | Attending: Radiation Oncology | Admitting: Radiation Oncology

## 2024-04-21 DIAGNOSIS — Z191 Hormone sensitive malignancy status: Secondary | ICD-10-CM | POA: Diagnosis not present

## 2024-04-21 DIAGNOSIS — C61 Malignant neoplasm of prostate: Secondary | ICD-10-CM | POA: Diagnosis not present

## 2024-04-21 DIAGNOSIS — Z510 Encounter for antineoplastic radiation therapy: Secondary | ICD-10-CM | POA: Diagnosis not present

## 2024-04-21 LAB — RAD ONC ARIA SESSION SUMMARY
Course Elapsed Days: 5
Plan Fractions Treated to Date: 4
Plan Prescribed Dose Per Fraction: 1.8 Gy
Plan Total Fractions Prescribed: 25
Plan Total Prescribed Dose: 45 Gy
Reference Point Dosage Given to Date: 7.2 Gy
Reference Point Session Dosage Given: 1.8 Gy
Session Number: 4

## 2024-04-22 ENCOUNTER — Ambulatory Visit
Admission: RE | Admit: 2024-04-22 | Discharge: 2024-04-22 | Disposition: A | Discharge status: Home / Self Care | Source: Ambulatory Visit | Attending: Radiation Oncology | Admitting: Radiation Oncology

## 2024-04-22 ENCOUNTER — Other Ambulatory Visit: Payer: Self-pay

## 2024-04-22 DIAGNOSIS — Z191 Hormone sensitive malignancy status: Secondary | ICD-10-CM | POA: Diagnosis not present

## 2024-04-22 DIAGNOSIS — C61 Malignant neoplasm of prostate: Secondary | ICD-10-CM | POA: Diagnosis not present

## 2024-04-22 DIAGNOSIS — Z510 Encounter for antineoplastic radiation therapy: Secondary | ICD-10-CM | POA: Diagnosis not present

## 2024-04-22 LAB — RAD ONC ARIA SESSION SUMMARY
Course Elapsed Days: 6
Plan Fractions Treated to Date: 5
Plan Prescribed Dose Per Fraction: 1.8 Gy
Plan Total Fractions Prescribed: 25
Plan Total Prescribed Dose: 45 Gy
Reference Point Dosage Given to Date: 9 Gy
Reference Point Session Dosage Given: 1.8 Gy
Session Number: 5

## 2024-04-23 ENCOUNTER — Other Ambulatory Visit: Payer: Self-pay

## 2024-04-23 ENCOUNTER — Ambulatory Visit
Admission: RE | Admit: 2024-04-23 | Discharge: 2024-04-23 | Disposition: A | Discharge status: Home / Self Care | Source: Ambulatory Visit | Attending: Radiation Oncology

## 2024-04-23 DIAGNOSIS — Z191 Hormone sensitive malignancy status: Secondary | ICD-10-CM | POA: Diagnosis not present

## 2024-04-23 DIAGNOSIS — Z510 Encounter for antineoplastic radiation therapy: Secondary | ICD-10-CM | POA: Diagnosis not present

## 2024-04-23 DIAGNOSIS — C61 Malignant neoplasm of prostate: Secondary | ICD-10-CM | POA: Diagnosis not present

## 2024-04-23 LAB — RAD ONC ARIA SESSION SUMMARY
Course Elapsed Days: 7
Plan Fractions Treated to Date: 6
Plan Prescribed Dose Per Fraction: 1.8 Gy
Plan Total Fractions Prescribed: 25
Plan Total Prescribed Dose: 45 Gy
Reference Point Dosage Given to Date: 10.8 Gy
Reference Point Session Dosage Given: 1.8 Gy
Session Number: 6

## 2024-04-24 ENCOUNTER — Other Ambulatory Visit: Payer: Self-pay

## 2024-04-24 ENCOUNTER — Ambulatory Visit
Admission: RE | Admit: 2024-04-24 | Discharge: 2024-04-24 | Disposition: A | Discharge status: Home / Self Care | Source: Ambulatory Visit | Attending: Radiation Oncology | Admitting: Radiation Oncology

## 2024-04-24 DIAGNOSIS — Z510 Encounter for antineoplastic radiation therapy: Secondary | ICD-10-CM | POA: Diagnosis not present

## 2024-04-24 DIAGNOSIS — Z191 Hormone sensitive malignancy status: Secondary | ICD-10-CM | POA: Diagnosis not present

## 2024-04-24 DIAGNOSIS — C61 Malignant neoplasm of prostate: Secondary | ICD-10-CM | POA: Diagnosis not present

## 2024-04-24 LAB — RAD ONC ARIA SESSION SUMMARY
Course Elapsed Days: 8
Plan Fractions Treated to Date: 7
Plan Prescribed Dose Per Fraction: 1.8 Gy
Plan Total Fractions Prescribed: 25
Plan Total Prescribed Dose: 45 Gy
Reference Point Dosage Given to Date: 12.6 Gy
Reference Point Session Dosage Given: 1.8 Gy
Session Number: 7

## 2024-04-25 ENCOUNTER — Other Ambulatory Visit: Payer: Self-pay

## 2024-04-25 ENCOUNTER — Ambulatory Visit
Admission: RE | Admit: 2024-04-25 | Discharge: 2024-04-25 | Disposition: A | Discharge status: Home / Self Care | Source: Ambulatory Visit | Attending: Radiation Oncology | Admitting: Radiation Oncology

## 2024-04-25 DIAGNOSIS — C61 Malignant neoplasm of prostate: Secondary | ICD-10-CM | POA: Diagnosis not present

## 2024-04-25 DIAGNOSIS — Z510 Encounter for antineoplastic radiation therapy: Secondary | ICD-10-CM | POA: Diagnosis not present

## 2024-04-25 DIAGNOSIS — Z191 Hormone sensitive malignancy status: Secondary | ICD-10-CM | POA: Diagnosis not present

## 2024-04-25 LAB — RAD ONC ARIA SESSION SUMMARY
Course Elapsed Days: 9
Plan Fractions Treated to Date: 8
Plan Prescribed Dose Per Fraction: 1.8 Gy
Plan Total Fractions Prescribed: 25
Plan Total Prescribed Dose: 45 Gy
Reference Point Dosage Given to Date: 14.4 Gy
Reference Point Session Dosage Given: 1.8 Gy
Session Number: 8

## 2024-04-28 ENCOUNTER — Other Ambulatory Visit: Payer: Self-pay

## 2024-04-28 ENCOUNTER — Ambulatory Visit
Admission: RE | Admit: 2024-04-28 | Discharge: 2024-04-28 | Disposition: A | Discharge status: Home / Self Care | Source: Ambulatory Visit | Attending: Radiation Oncology | Admitting: Radiation Oncology

## 2024-04-28 ENCOUNTER — Ambulatory Visit: Admitting: Radiation Oncology

## 2024-04-28 DIAGNOSIS — Z510 Encounter for antineoplastic radiation therapy: Secondary | ICD-10-CM | POA: Diagnosis not present

## 2024-04-28 DIAGNOSIS — Z191 Hormone sensitive malignancy status: Secondary | ICD-10-CM | POA: Diagnosis not present

## 2024-04-28 DIAGNOSIS — C61 Malignant neoplasm of prostate: Secondary | ICD-10-CM | POA: Diagnosis not present

## 2024-04-28 LAB — RAD ONC ARIA SESSION SUMMARY
Course Elapsed Days: 12
Plan Fractions Treated to Date: 9
Plan Prescribed Dose Per Fraction: 1.8 Gy
Plan Total Fractions Prescribed: 25
Plan Total Prescribed Dose: 45 Gy
Reference Point Dosage Given to Date: 16.2 Gy
Reference Point Session Dosage Given: 1.8 Gy
Session Number: 9

## 2024-04-29 ENCOUNTER — Ambulatory Visit
Admission: RE | Admit: 2024-04-29 | Discharge: 2024-04-29 | Disposition: A | Discharge status: Home / Self Care | Source: Ambulatory Visit | Attending: Radiation Oncology | Admitting: Radiation Oncology

## 2024-04-29 ENCOUNTER — Other Ambulatory Visit: Payer: Self-pay

## 2024-04-29 DIAGNOSIS — Z191 Hormone sensitive malignancy status: Secondary | ICD-10-CM | POA: Diagnosis not present

## 2024-04-29 DIAGNOSIS — Z510 Encounter for antineoplastic radiation therapy: Secondary | ICD-10-CM | POA: Diagnosis not present

## 2024-04-29 DIAGNOSIS — C61 Malignant neoplasm of prostate: Secondary | ICD-10-CM | POA: Diagnosis not present

## 2024-04-29 LAB — RAD ONC ARIA SESSION SUMMARY
Course Elapsed Days: 13
Plan Fractions Treated to Date: 10
Plan Prescribed Dose Per Fraction: 1.8 Gy
Plan Total Fractions Prescribed: 25
Plan Total Prescribed Dose: 45 Gy
Reference Point Dosage Given to Date: 18 Gy
Reference Point Session Dosage Given: 1.8 Gy
Session Number: 10

## 2024-04-30 ENCOUNTER — Ambulatory Visit
Admission: RE | Admit: 2024-04-30 | Discharge: 2024-04-30 | Disposition: A | Discharge status: Home / Self Care | Source: Ambulatory Visit | Attending: Radiation Oncology | Admitting: Radiation Oncology

## 2024-04-30 ENCOUNTER — Other Ambulatory Visit: Payer: Self-pay

## 2024-04-30 DIAGNOSIS — C61 Malignant neoplasm of prostate: Secondary | ICD-10-CM | POA: Diagnosis not present

## 2024-04-30 DIAGNOSIS — Z510 Encounter for antineoplastic radiation therapy: Secondary | ICD-10-CM | POA: Diagnosis not present

## 2024-04-30 DIAGNOSIS — Z191 Hormone sensitive malignancy status: Secondary | ICD-10-CM | POA: Diagnosis not present

## 2024-04-30 LAB — RAD ONC ARIA SESSION SUMMARY
Course Elapsed Days: 14
Plan Fractions Treated to Date: 1
Plan Prescribed Dose Per Fraction: 1.8 Gy
Plan Total Fractions Prescribed: 15
Plan Total Prescribed Dose: 27 Gy
Reference Point Dosage Given to Date: 19.8 Gy
Reference Point Session Dosage Given: 1.8 Gy
Session Number: 11

## 2024-05-01 ENCOUNTER — Other Ambulatory Visit: Payer: Self-pay

## 2024-05-01 ENCOUNTER — Ambulatory Visit
Admission: RE | Admit: 2024-05-01 | Discharge: 2024-05-01 | Disposition: A | Discharge status: Home / Self Care | Source: Ambulatory Visit | Attending: Radiation Oncology

## 2024-05-01 DIAGNOSIS — C61 Malignant neoplasm of prostate: Secondary | ICD-10-CM | POA: Diagnosis not present

## 2024-05-01 DIAGNOSIS — Z510 Encounter for antineoplastic radiation therapy: Secondary | ICD-10-CM | POA: Diagnosis not present

## 2024-05-01 DIAGNOSIS — Z191 Hormone sensitive malignancy status: Secondary | ICD-10-CM | POA: Diagnosis not present

## 2024-05-01 LAB — RAD ONC ARIA SESSION SUMMARY
Course Elapsed Days: 15
Plan Fractions Treated to Date: 2
Plan Prescribed Dose Per Fraction: 1.8 Gy
Plan Total Fractions Prescribed: 15
Plan Total Prescribed Dose: 27 Gy
Reference Point Dosage Given to Date: 21.6 Gy
Reference Point Session Dosage Given: 1.8 Gy
Session Number: 12

## 2024-05-02 ENCOUNTER — Other Ambulatory Visit: Payer: Self-pay

## 2024-05-02 ENCOUNTER — Ambulatory Visit
Admission: RE | Admit: 2024-05-02 | Discharge: 2024-05-02 | Disposition: A | Discharge status: Home / Self Care | Source: Ambulatory Visit | Attending: Radiation Oncology | Admitting: Radiation Oncology

## 2024-05-02 DIAGNOSIS — C61 Malignant neoplasm of prostate: Secondary | ICD-10-CM | POA: Diagnosis not present

## 2024-05-02 DIAGNOSIS — Z191 Hormone sensitive malignancy status: Secondary | ICD-10-CM | POA: Diagnosis not present

## 2024-05-02 DIAGNOSIS — Z510 Encounter for antineoplastic radiation therapy: Secondary | ICD-10-CM | POA: Diagnosis not present

## 2024-05-02 LAB — RAD ONC ARIA SESSION SUMMARY
Course Elapsed Days: 16
Plan Fractions Treated to Date: 3
Plan Prescribed Dose Per Fraction: 1.8 Gy
Plan Total Fractions Prescribed: 15
Plan Total Prescribed Dose: 27 Gy
Reference Point Dosage Given to Date: 23.4 Gy
Reference Point Session Dosage Given: 1.8 Gy
Session Number: 13

## 2024-05-06 ENCOUNTER — Ambulatory Visit
Admission: RE | Admit: 2024-05-06 | Discharge: 2024-05-06 | Disposition: A | Discharge status: Home / Self Care | Source: Ambulatory Visit | Attending: Radiation Oncology | Admitting: Radiation Oncology

## 2024-05-06 ENCOUNTER — Other Ambulatory Visit: Payer: Self-pay

## 2024-05-06 DIAGNOSIS — Z510 Encounter for antineoplastic radiation therapy: Secondary | ICD-10-CM | POA: Insufficient documentation

## 2024-05-06 DIAGNOSIS — C61 Malignant neoplasm of prostate: Secondary | ICD-10-CM | POA: Insufficient documentation

## 2024-05-06 DIAGNOSIS — Z191 Hormone sensitive malignancy status: Secondary | ICD-10-CM | POA: Diagnosis not present

## 2024-05-06 LAB — RAD ONC ARIA SESSION SUMMARY
Course Elapsed Days: 20
Plan Fractions Treated to Date: 4
Plan Prescribed Dose Per Fraction: 1.8 Gy
Plan Total Fractions Prescribed: 15
Plan Total Prescribed Dose: 27 Gy
Reference Point Dosage Given to Date: 25.2 Gy
Reference Point Session Dosage Given: 1.8 Gy
Session Number: 14

## 2024-05-07 ENCOUNTER — Ambulatory Visit
Admission: RE | Admit: 2024-05-07 | Discharge: 2024-05-07 | Disposition: A | Discharge status: Home / Self Care | Source: Ambulatory Visit | Attending: Radiation Oncology

## 2024-05-07 ENCOUNTER — Other Ambulatory Visit: Payer: Self-pay

## 2024-05-07 DIAGNOSIS — C61 Malignant neoplasm of prostate: Secondary | ICD-10-CM | POA: Diagnosis not present

## 2024-05-07 DIAGNOSIS — Z510 Encounter for antineoplastic radiation therapy: Secondary | ICD-10-CM | POA: Diagnosis not present

## 2024-05-07 DIAGNOSIS — Z191 Hormone sensitive malignancy status: Secondary | ICD-10-CM | POA: Diagnosis not present

## 2024-05-07 LAB — RAD ONC ARIA SESSION SUMMARY
Course Elapsed Days: 21
Plan Fractions Treated to Date: 5
Plan Prescribed Dose Per Fraction: 1.8 Gy
Plan Total Fractions Prescribed: 15
Plan Total Prescribed Dose: 27 Gy
Reference Point Dosage Given to Date: 27 Gy
Reference Point Session Dosage Given: 1.8 Gy
Session Number: 15

## 2024-05-08 ENCOUNTER — Ambulatory Visit
Admission: RE | Admit: 2024-05-08 | Discharge: 2024-05-08 | Disposition: A | Discharge status: Home / Self Care | Source: Ambulatory Visit | Attending: Radiation Oncology

## 2024-05-08 ENCOUNTER — Other Ambulatory Visit: Payer: Self-pay

## 2024-05-08 DIAGNOSIS — Z191 Hormone sensitive malignancy status: Secondary | ICD-10-CM | POA: Diagnosis not present

## 2024-05-08 DIAGNOSIS — Z510 Encounter for antineoplastic radiation therapy: Secondary | ICD-10-CM | POA: Diagnosis not present

## 2024-05-08 DIAGNOSIS — C61 Malignant neoplasm of prostate: Secondary | ICD-10-CM | POA: Diagnosis not present

## 2024-05-08 LAB — RAD ONC ARIA SESSION SUMMARY
Course Elapsed Days: 22
Plan Fractions Treated to Date: 6
Plan Prescribed Dose Per Fraction: 1.8 Gy
Plan Total Fractions Prescribed: 15
Plan Total Prescribed Dose: 27 Gy
Reference Point Dosage Given to Date: 28.8 Gy
Reference Point Session Dosage Given: 1.8 Gy
Session Number: 16

## 2024-05-09 ENCOUNTER — Ambulatory Visit
Admission: RE | Admit: 2024-05-09 | Discharge: 2024-05-09 | Disposition: A | Discharge status: Home / Self Care | Source: Ambulatory Visit | Attending: Radiation Oncology | Admitting: Radiation Oncology

## 2024-05-09 ENCOUNTER — Ambulatory Visit

## 2024-05-09 ENCOUNTER — Other Ambulatory Visit: Payer: Self-pay

## 2024-05-09 DIAGNOSIS — C61 Malignant neoplasm of prostate: Secondary | ICD-10-CM | POA: Diagnosis not present

## 2024-05-09 DIAGNOSIS — Z191 Hormone sensitive malignancy status: Secondary | ICD-10-CM | POA: Diagnosis not present

## 2024-05-09 DIAGNOSIS — Z510 Encounter for antineoplastic radiation therapy: Secondary | ICD-10-CM | POA: Diagnosis not present

## 2024-05-09 LAB — RAD ONC ARIA SESSION SUMMARY
Course Elapsed Days: 23
Plan Fractions Treated to Date: 7
Plan Prescribed Dose Per Fraction: 1.8 Gy
Plan Total Fractions Prescribed: 15
Plan Total Prescribed Dose: 27 Gy
Reference Point Dosage Given to Date: 30.6 Gy
Reference Point Session Dosage Given: 1.8 Gy
Session Number: 17

## 2024-05-12 ENCOUNTER — Other Ambulatory Visit: Payer: Self-pay

## 2024-05-12 ENCOUNTER — Ambulatory Visit
Admission: RE | Admit: 2024-05-12 | Discharge: 2024-05-12 | Disposition: A | Discharge status: Home / Self Care | Source: Ambulatory Visit | Attending: Radiation Oncology

## 2024-05-12 DIAGNOSIS — Z191 Hormone sensitive malignancy status: Secondary | ICD-10-CM | POA: Diagnosis not present

## 2024-05-12 DIAGNOSIS — C61 Malignant neoplasm of prostate: Secondary | ICD-10-CM | POA: Diagnosis not present

## 2024-05-12 DIAGNOSIS — Z510 Encounter for antineoplastic radiation therapy: Secondary | ICD-10-CM | POA: Diagnosis not present

## 2024-05-12 LAB — RAD ONC ARIA SESSION SUMMARY
Course Elapsed Days: 26
Plan Fractions Treated to Date: 8
Plan Prescribed Dose Per Fraction: 1.8 Gy
Plan Total Fractions Prescribed: 15
Plan Total Prescribed Dose: 27 Gy
Reference Point Dosage Given to Date: 32.4 Gy
Reference Point Session Dosage Given: 1.8 Gy
Session Number: 18

## 2024-05-13 ENCOUNTER — Ambulatory Visit
Admission: RE | Admit: 2024-05-13 | Discharge: 2024-05-13 | Disposition: A | Discharge status: Home / Self Care | Source: Ambulatory Visit | Attending: Radiation Oncology

## 2024-05-13 ENCOUNTER — Other Ambulatory Visit: Payer: Self-pay

## 2024-05-13 DIAGNOSIS — C61 Malignant neoplasm of prostate: Secondary | ICD-10-CM | POA: Diagnosis not present

## 2024-05-13 DIAGNOSIS — Z191 Hormone sensitive malignancy status: Secondary | ICD-10-CM | POA: Diagnosis not present

## 2024-05-13 DIAGNOSIS — Z510 Encounter for antineoplastic radiation therapy: Secondary | ICD-10-CM | POA: Diagnosis not present

## 2024-05-13 LAB — RAD ONC ARIA SESSION SUMMARY
Course Elapsed Days: 27
Plan Fractions Treated to Date: 9
Plan Prescribed Dose Per Fraction: 1.8 Gy
Plan Total Fractions Prescribed: 15
Plan Total Prescribed Dose: 27 Gy
Reference Point Dosage Given to Date: 34.2 Gy
Reference Point Session Dosage Given: 1.8 Gy
Session Number: 19

## 2024-05-14 ENCOUNTER — Ambulatory Visit
Admission: RE | Admit: 2024-05-14 | Discharge: 2024-05-14 | Disposition: A | Discharge status: Home / Self Care | Source: Ambulatory Visit | Attending: Radiation Oncology | Admitting: Radiation Oncology

## 2024-05-14 ENCOUNTER — Other Ambulatory Visit: Payer: Self-pay

## 2024-05-14 DIAGNOSIS — C61 Malignant neoplasm of prostate: Secondary | ICD-10-CM | POA: Diagnosis not present

## 2024-05-14 DIAGNOSIS — Z191 Hormone sensitive malignancy status: Secondary | ICD-10-CM | POA: Diagnosis not present

## 2024-05-14 DIAGNOSIS — Z510 Encounter for antineoplastic radiation therapy: Secondary | ICD-10-CM | POA: Diagnosis not present

## 2024-05-14 LAB — RAD ONC ARIA SESSION SUMMARY
Course Elapsed Days: 28
Plan Fractions Treated to Date: 10
Plan Prescribed Dose Per Fraction: 1.8 Gy
Plan Total Fractions Prescribed: 15
Plan Total Prescribed Dose: 27 Gy
Reference Point Dosage Given to Date: 36 Gy
Reference Point Session Dosage Given: 1.8 Gy
Session Number: 20

## 2024-05-15 ENCOUNTER — Ambulatory Visit
Admission: RE | Admit: 2024-05-15 | Discharge: 2024-05-15 | Disposition: A | Discharge status: Home / Self Care | Source: Ambulatory Visit | Attending: Radiation Oncology | Admitting: Radiation Oncology

## 2024-05-15 ENCOUNTER — Other Ambulatory Visit: Payer: Self-pay

## 2024-05-15 ENCOUNTER — Ambulatory Visit
Admission: RE | Admit: 2024-05-15 | Discharge: 2024-05-15 | Disposition: A | Discharge status: Home / Self Care | Source: Ambulatory Visit | Attending: Radiation Oncology

## 2024-05-15 DIAGNOSIS — Z510 Encounter for antineoplastic radiation therapy: Secondary | ICD-10-CM | POA: Diagnosis not present

## 2024-05-15 DIAGNOSIS — Z191 Hormone sensitive malignancy status: Secondary | ICD-10-CM | POA: Diagnosis not present

## 2024-05-15 DIAGNOSIS — C61 Malignant neoplasm of prostate: Secondary | ICD-10-CM | POA: Diagnosis not present

## 2024-05-15 LAB — RAD ONC ARIA SESSION SUMMARY
Course Elapsed Days: 29
Plan Fractions Treated to Date: 11
Plan Prescribed Dose Per Fraction: 1.8 Gy
Plan Total Fractions Prescribed: 15
Plan Total Prescribed Dose: 27 Gy
Reference Point Dosage Given to Date: 37.8 Gy
Reference Point Session Dosage Given: 1.8 Gy
Session Number: 21

## 2024-05-16 ENCOUNTER — Ambulatory Visit

## 2024-05-16 ENCOUNTER — Ambulatory Visit
Admission: RE | Admit: 2024-05-16 | Discharge: 2024-05-16 | Disposition: A | Discharge status: Home / Self Care | Source: Ambulatory Visit | Attending: Radiation Oncology

## 2024-05-16 ENCOUNTER — Other Ambulatory Visit: Payer: Self-pay

## 2024-05-16 DIAGNOSIS — Z191 Hormone sensitive malignancy status: Secondary | ICD-10-CM | POA: Diagnosis not present

## 2024-05-16 DIAGNOSIS — Z510 Encounter for antineoplastic radiation therapy: Secondary | ICD-10-CM | POA: Diagnosis not present

## 2024-05-16 DIAGNOSIS — C61 Malignant neoplasm of prostate: Secondary | ICD-10-CM | POA: Diagnosis not present

## 2024-05-16 LAB — RAD ONC ARIA SESSION SUMMARY
Course Elapsed Days: 30
Plan Fractions Treated to Date: 12
Plan Prescribed Dose Per Fraction: 1.8 Gy
Plan Total Fractions Prescribed: 15
Plan Total Prescribed Dose: 27 Gy
Reference Point Dosage Given to Date: 39.6 Gy
Reference Point Session Dosage Given: 1.8 Gy
Session Number: 22

## 2024-05-19 ENCOUNTER — Other Ambulatory Visit: Payer: Self-pay

## 2024-05-19 ENCOUNTER — Ambulatory Visit
Admission: RE | Admit: 2024-05-19 | Discharge: 2024-05-19 | Disposition: A | Discharge status: Home / Self Care | Source: Ambulatory Visit | Attending: Radiation Oncology | Admitting: Radiation Oncology

## 2024-05-19 DIAGNOSIS — Z510 Encounter for antineoplastic radiation therapy: Secondary | ICD-10-CM | POA: Diagnosis not present

## 2024-05-19 DIAGNOSIS — Z191 Hormone sensitive malignancy status: Secondary | ICD-10-CM | POA: Diagnosis not present

## 2024-05-19 DIAGNOSIS — C61 Malignant neoplasm of prostate: Secondary | ICD-10-CM | POA: Diagnosis not present

## 2024-05-19 LAB — RAD ONC ARIA SESSION SUMMARY
Course Elapsed Days: 33
Plan Fractions Treated to Date: 13
Plan Prescribed Dose Per Fraction: 1.8 Gy
Plan Total Fractions Prescribed: 15
Plan Total Prescribed Dose: 27 Gy
Reference Point Dosage Given to Date: 41.4 Gy
Reference Point Session Dosage Given: 1.8 Gy
Session Number: 23

## 2024-05-20 ENCOUNTER — Other Ambulatory Visit: Payer: Self-pay

## 2024-05-20 ENCOUNTER — Ambulatory Visit
Admission: RE | Admit: 2024-05-20 | Discharge: 2024-05-20 | Disposition: A | Discharge status: Home / Self Care | Source: Ambulatory Visit | Attending: Radiation Oncology | Admitting: Radiation Oncology

## 2024-05-20 DIAGNOSIS — Z191 Hormone sensitive malignancy status: Secondary | ICD-10-CM | POA: Diagnosis not present

## 2024-05-20 DIAGNOSIS — C61 Malignant neoplasm of prostate: Secondary | ICD-10-CM | POA: Diagnosis not present

## 2024-05-20 DIAGNOSIS — Z510 Encounter for antineoplastic radiation therapy: Secondary | ICD-10-CM | POA: Diagnosis not present

## 2024-05-20 LAB — RAD ONC ARIA SESSION SUMMARY
Course Elapsed Days: 34
Plan Fractions Treated to Date: 14
Plan Prescribed Dose Per Fraction: 1.8 Gy
Plan Total Fractions Prescribed: 15
Plan Total Prescribed Dose: 27 Gy
Reference Point Dosage Given to Date: 43.2 Gy
Reference Point Session Dosage Given: 1.8 Gy
Session Number: 24

## 2024-05-21 ENCOUNTER — Other Ambulatory Visit: Payer: Self-pay

## 2024-05-21 ENCOUNTER — Ambulatory Visit
Admission: RE | Admit: 2024-05-21 | Discharge: 2024-05-21 | Disposition: A | Discharge status: Home / Self Care | Source: Ambulatory Visit | Attending: Radiation Oncology

## 2024-05-21 DIAGNOSIS — Z191 Hormone sensitive malignancy status: Secondary | ICD-10-CM | POA: Diagnosis not present

## 2024-05-21 DIAGNOSIS — Z510 Encounter for antineoplastic radiation therapy: Secondary | ICD-10-CM | POA: Diagnosis not present

## 2024-05-21 DIAGNOSIS — C61 Malignant neoplasm of prostate: Secondary | ICD-10-CM | POA: Diagnosis not present

## 2024-05-21 LAB — RAD ONC ARIA SESSION SUMMARY
Course Elapsed Days: 35
Plan Fractions Treated to Date: 15
Plan Prescribed Dose Per Fraction: 1.8 Gy
Plan Total Fractions Prescribed: 15
Plan Total Prescribed Dose: 27 Gy
Reference Point Dosage Given to Date: 45 Gy
Reference Point Session Dosage Given: 1.8 Gy
Session Number: 25

## 2024-05-22 ENCOUNTER — Ambulatory Visit

## 2024-05-22 ENCOUNTER — Telehealth: Payer: Self-pay | Admitting: Radiation Oncology

## 2024-05-22 NOTE — Telephone Encounter (Signed)
 Pt LVM advising he would be missing today's tx appt due to COPD issues but still plans to come to tomorrow's appt. VM forwarded to L4 team.

## 2024-05-23 ENCOUNTER — Ambulatory Visit

## 2024-05-23 ENCOUNTER — Ambulatory Visit
Admission: RE | Admit: 2024-05-23 | Discharge: 2024-05-23 | Disposition: A | Discharge status: Home / Self Care | Source: Ambulatory Visit | Attending: Radiation Oncology | Admitting: Radiation Oncology

## 2024-05-23 ENCOUNTER — Other Ambulatory Visit: Payer: Self-pay

## 2024-05-23 DIAGNOSIS — Z191 Hormone sensitive malignancy status: Secondary | ICD-10-CM | POA: Diagnosis not present

## 2024-05-23 DIAGNOSIS — Z510 Encounter for antineoplastic radiation therapy: Secondary | ICD-10-CM | POA: Diagnosis not present

## 2024-05-23 DIAGNOSIS — C61 Malignant neoplasm of prostate: Secondary | ICD-10-CM | POA: Diagnosis not present

## 2024-05-23 LAB — RAD ONC ARIA SESSION SUMMARY
Course Elapsed Days: 37
Plan Fractions Treated to Date: 1
Plan Prescribed Dose Per Fraction: 2 Gy
Plan Total Fractions Prescribed: 15
Plan Total Prescribed Dose: 30 Gy
Reference Point Dosage Given to Date: 2 Gy
Reference Point Session Dosage Given: 2 Gy
Session Number: 26

## 2024-05-26 ENCOUNTER — Ambulatory Visit
Admission: RE | Admit: 2024-05-26 | Discharge: 2024-05-26 | Disposition: A | Discharge status: Home / Self Care | Source: Ambulatory Visit | Attending: Radiation Oncology | Admitting: Radiation Oncology

## 2024-05-26 ENCOUNTER — Other Ambulatory Visit: Payer: Self-pay

## 2024-05-26 DIAGNOSIS — C61 Malignant neoplasm of prostate: Secondary | ICD-10-CM | POA: Diagnosis not present

## 2024-05-26 DIAGNOSIS — Z191 Hormone sensitive malignancy status: Secondary | ICD-10-CM | POA: Diagnosis not present

## 2024-05-26 DIAGNOSIS — Z510 Encounter for antineoplastic radiation therapy: Secondary | ICD-10-CM | POA: Diagnosis not present

## 2024-05-26 LAB — RAD ONC ARIA SESSION SUMMARY
Course Elapsed Days: 40
Plan Fractions Treated to Date: 2
Plan Prescribed Dose Per Fraction: 2 Gy
Plan Total Fractions Prescribed: 15
Plan Total Prescribed Dose: 30 Gy
Reference Point Dosage Given to Date: 4 Gy
Reference Point Session Dosage Given: 2 Gy
Session Number: 27

## 2024-05-27 ENCOUNTER — Ambulatory Visit

## 2024-05-28 ENCOUNTER — Other Ambulatory Visit: Payer: Self-pay

## 2024-05-28 ENCOUNTER — Ambulatory Visit
Admission: RE | Admit: 2024-05-28 | Discharge: 2024-05-28 | Disposition: A | Discharge status: Home / Self Care | Source: Ambulatory Visit | Attending: Radiation Oncology | Admitting: Radiation Oncology

## 2024-05-28 DIAGNOSIS — Z510 Encounter for antineoplastic radiation therapy: Secondary | ICD-10-CM | POA: Diagnosis not present

## 2024-05-28 DIAGNOSIS — Z191 Hormone sensitive malignancy status: Secondary | ICD-10-CM | POA: Diagnosis not present

## 2024-05-28 DIAGNOSIS — C61 Malignant neoplasm of prostate: Secondary | ICD-10-CM | POA: Diagnosis not present

## 2024-05-28 LAB — RAD ONC ARIA SESSION SUMMARY
Course Elapsed Days: 42
Plan Fractions Treated to Date: 3
Plan Prescribed Dose Per Fraction: 2 Gy
Plan Total Fractions Prescribed: 15
Plan Total Prescribed Dose: 30 Gy
Reference Point Dosage Given to Date: 6 Gy
Reference Point Session Dosage Given: 2 Gy
Session Number: 28

## 2024-05-29 ENCOUNTER — Ambulatory Visit

## 2024-05-29 ENCOUNTER — Other Ambulatory Visit: Payer: Self-pay

## 2024-05-29 ENCOUNTER — Ambulatory Visit
Admission: RE | Admit: 2024-05-29 | Discharge: 2024-05-29 | Disposition: A | Discharge status: Home / Self Care | Source: Ambulatory Visit | Attending: Radiation Oncology | Admitting: Radiation Oncology

## 2024-05-29 DIAGNOSIS — Z191 Hormone sensitive malignancy status: Secondary | ICD-10-CM | POA: Diagnosis not present

## 2024-05-29 DIAGNOSIS — Z510 Encounter for antineoplastic radiation therapy: Secondary | ICD-10-CM | POA: Diagnosis not present

## 2024-05-29 DIAGNOSIS — C61 Malignant neoplasm of prostate: Secondary | ICD-10-CM | POA: Diagnosis not present

## 2024-05-29 LAB — RAD ONC ARIA SESSION SUMMARY
Course Elapsed Days: 43
Plan Fractions Treated to Date: 4
Plan Prescribed Dose Per Fraction: 2 Gy
Plan Total Fractions Prescribed: 15
Plan Total Prescribed Dose: 30 Gy
Reference Point Dosage Given to Date: 8 Gy
Reference Point Session Dosage Given: 2 Gy
Session Number: 29

## 2024-05-30 ENCOUNTER — Other Ambulatory Visit: Payer: Self-pay

## 2024-05-30 ENCOUNTER — Ambulatory Visit
Admission: RE | Admit: 2024-05-30 | Discharge: 2024-05-30 | Disposition: A | Discharge status: Home / Self Care | Source: Ambulatory Visit | Attending: Radiation Oncology | Admitting: Radiation Oncology

## 2024-05-30 DIAGNOSIS — Z510 Encounter for antineoplastic radiation therapy: Secondary | ICD-10-CM | POA: Diagnosis not present

## 2024-05-30 DIAGNOSIS — C61 Malignant neoplasm of prostate: Secondary | ICD-10-CM | POA: Diagnosis not present

## 2024-05-30 DIAGNOSIS — Z191 Hormone sensitive malignancy status: Secondary | ICD-10-CM | POA: Diagnosis not present

## 2024-05-30 LAB — RAD ONC ARIA SESSION SUMMARY
Course Elapsed Days: 44
Plan Fractions Treated to Date: 5
Plan Prescribed Dose Per Fraction: 2 Gy
Plan Total Fractions Prescribed: 15
Plan Total Prescribed Dose: 30 Gy
Reference Point Dosage Given to Date: 10 Gy
Reference Point Session Dosage Given: 2 Gy
Session Number: 30

## 2024-06-02 ENCOUNTER — Ambulatory Visit
Admission: RE | Admit: 2024-06-02 | Discharge: 2024-06-02 | Disposition: A | Source: Ambulatory Visit | Attending: Radiation Oncology

## 2024-06-02 ENCOUNTER — Other Ambulatory Visit: Payer: Self-pay

## 2024-06-02 DIAGNOSIS — C61 Malignant neoplasm of prostate: Secondary | ICD-10-CM | POA: Diagnosis not present

## 2024-06-02 DIAGNOSIS — Z191 Hormone sensitive malignancy status: Secondary | ICD-10-CM | POA: Diagnosis not present

## 2024-06-02 DIAGNOSIS — Z510 Encounter for antineoplastic radiation therapy: Secondary | ICD-10-CM | POA: Diagnosis not present

## 2024-06-02 LAB — RAD ONC ARIA SESSION SUMMARY
Course Elapsed Days: 47
Plan Fractions Treated to Date: 6
Plan Prescribed Dose Per Fraction: 2 Gy
Plan Total Fractions Prescribed: 15
Plan Total Prescribed Dose: 30 Gy
Reference Point Dosage Given to Date: 12 Gy
Reference Point Session Dosage Given: 2 Gy
Session Number: 31

## 2024-06-03 ENCOUNTER — Ambulatory Visit
Admission: RE | Admit: 2024-06-03 | Discharge: 2024-06-03 | Disposition: A | Discharge status: Home / Self Care | Source: Ambulatory Visit | Attending: Radiation Oncology | Admitting: Radiation Oncology

## 2024-06-03 ENCOUNTER — Other Ambulatory Visit: Payer: Self-pay

## 2024-06-03 DIAGNOSIS — Z510 Encounter for antineoplastic radiation therapy: Secondary | ICD-10-CM | POA: Diagnosis not present

## 2024-06-03 DIAGNOSIS — Z191 Hormone sensitive malignancy status: Secondary | ICD-10-CM | POA: Diagnosis not present

## 2024-06-03 DIAGNOSIS — C61 Malignant neoplasm of prostate: Secondary | ICD-10-CM | POA: Diagnosis not present

## 2024-06-03 LAB — RAD ONC ARIA SESSION SUMMARY
Course Elapsed Days: 48
Plan Fractions Treated to Date: 7
Plan Prescribed Dose Per Fraction: 2 Gy
Plan Total Fractions Prescribed: 15
Plan Total Prescribed Dose: 30 Gy
Reference Point Dosage Given to Date: 14 Gy
Reference Point Session Dosage Given: 2 Gy
Session Number: 32

## 2024-06-04 ENCOUNTER — Other Ambulatory Visit: Payer: Self-pay

## 2024-06-04 ENCOUNTER — Ambulatory Visit
Admission: RE | Admit: 2024-06-04 | Discharge: 2024-06-04 | Disposition: A | Discharge status: Home / Self Care | Source: Ambulatory Visit | Attending: Radiation Oncology | Admitting: Radiation Oncology

## 2024-06-04 DIAGNOSIS — C61 Malignant neoplasm of prostate: Secondary | ICD-10-CM | POA: Insufficient documentation

## 2024-06-04 DIAGNOSIS — Z510 Encounter for antineoplastic radiation therapy: Secondary | ICD-10-CM | POA: Insufficient documentation

## 2024-06-04 DIAGNOSIS — Z191 Hormone sensitive malignancy status: Secondary | ICD-10-CM | POA: Diagnosis not present

## 2024-06-04 LAB — RAD ONC ARIA SESSION SUMMARY
Course Elapsed Days: 49
Plan Fractions Treated to Date: 8
Plan Prescribed Dose Per Fraction: 2 Gy
Plan Total Fractions Prescribed: 15
Plan Total Prescribed Dose: 30 Gy
Reference Point Dosage Given to Date: 16 Gy
Reference Point Session Dosage Given: 2 Gy
Session Number: 33

## 2024-06-05 ENCOUNTER — Ambulatory Visit
Admission: RE | Admit: 2024-06-05 | Discharge: 2024-06-05 | Disposition: A | Discharge status: Home / Self Care | Source: Ambulatory Visit | Attending: Radiation Oncology | Admitting: Radiation Oncology

## 2024-06-05 ENCOUNTER — Other Ambulatory Visit: Payer: Self-pay

## 2024-06-05 DIAGNOSIS — C61 Malignant neoplasm of prostate: Secondary | ICD-10-CM | POA: Diagnosis not present

## 2024-06-05 DIAGNOSIS — Z191 Hormone sensitive malignancy status: Secondary | ICD-10-CM | POA: Diagnosis not present

## 2024-06-05 DIAGNOSIS — Z510 Encounter for antineoplastic radiation therapy: Secondary | ICD-10-CM | POA: Diagnosis not present

## 2024-06-05 LAB — RAD ONC ARIA SESSION SUMMARY
Course Elapsed Days: 50
Plan Fractions Treated to Date: 9
Plan Prescribed Dose Per Fraction: 2 Gy
Plan Total Fractions Prescribed: 15
Plan Total Prescribed Dose: 30 Gy
Reference Point Dosage Given to Date: 18 Gy
Reference Point Session Dosage Given: 2 Gy
Session Number: 34

## 2024-06-06 ENCOUNTER — Other Ambulatory Visit: Payer: Self-pay

## 2024-06-06 ENCOUNTER — Ambulatory Visit
Admission: RE | Admit: 2024-06-06 | Discharge: 2024-06-06 | Disposition: A | Discharge status: Home / Self Care | Source: Ambulatory Visit | Attending: Radiation Oncology | Admitting: Radiation Oncology

## 2024-06-06 DIAGNOSIS — Z510 Encounter for antineoplastic radiation therapy: Secondary | ICD-10-CM | POA: Diagnosis not present

## 2024-06-06 DIAGNOSIS — Z191 Hormone sensitive malignancy status: Secondary | ICD-10-CM | POA: Diagnosis not present

## 2024-06-06 DIAGNOSIS — C61 Malignant neoplasm of prostate: Secondary | ICD-10-CM | POA: Diagnosis not present

## 2024-06-06 LAB — RAD ONC ARIA SESSION SUMMARY
Course Elapsed Days: 51
Plan Fractions Treated to Date: 10
Plan Prescribed Dose Per Fraction: 2 Gy
Plan Total Fractions Prescribed: 15
Plan Total Prescribed Dose: 30 Gy
Reference Point Dosage Given to Date: 20 Gy
Reference Point Session Dosage Given: 2 Gy
Session Number: 35

## 2024-06-09 ENCOUNTER — Ambulatory Visit
Admission: RE | Admit: 2024-06-09 | Discharge: 2024-06-09 | Disposition: A | Discharge status: Home / Self Care | Source: Ambulatory Visit | Attending: Radiation Oncology | Admitting: Radiation Oncology

## 2024-06-09 ENCOUNTER — Other Ambulatory Visit: Payer: Self-pay

## 2024-06-09 DIAGNOSIS — Z191 Hormone sensitive malignancy status: Secondary | ICD-10-CM | POA: Diagnosis not present

## 2024-06-09 DIAGNOSIS — Z510 Encounter for antineoplastic radiation therapy: Secondary | ICD-10-CM | POA: Diagnosis not present

## 2024-06-09 DIAGNOSIS — C61 Malignant neoplasm of prostate: Secondary | ICD-10-CM | POA: Diagnosis not present

## 2024-06-09 LAB — RAD ONC ARIA SESSION SUMMARY
Course Elapsed Days: 54
Plan Fractions Treated to Date: 11
Plan Prescribed Dose Per Fraction: 2 Gy
Plan Total Fractions Prescribed: 15
Plan Total Prescribed Dose: 30 Gy
Reference Point Dosage Given to Date: 22 Gy
Reference Point Session Dosage Given: 2 Gy
Session Number: 36

## 2024-06-10 ENCOUNTER — Ambulatory Visit
Admission: RE | Admit: 2024-06-10 | Discharge: 2024-06-10 | Disposition: A | Source: Ambulatory Visit | Attending: Radiation Oncology

## 2024-06-10 ENCOUNTER — Other Ambulatory Visit: Payer: Self-pay

## 2024-06-10 DIAGNOSIS — Z510 Encounter for antineoplastic radiation therapy: Secondary | ICD-10-CM | POA: Diagnosis not present

## 2024-06-10 DIAGNOSIS — Z191 Hormone sensitive malignancy status: Secondary | ICD-10-CM | POA: Diagnosis not present

## 2024-06-10 DIAGNOSIS — C61 Malignant neoplasm of prostate: Secondary | ICD-10-CM | POA: Diagnosis not present

## 2024-06-10 LAB — RAD ONC ARIA SESSION SUMMARY
Course Elapsed Days: 55
Plan Fractions Treated to Date: 12
Plan Prescribed Dose Per Fraction: 2 Gy
Plan Total Fractions Prescribed: 15
Plan Total Prescribed Dose: 30 Gy
Reference Point Dosage Given to Date: 24 Gy
Reference Point Session Dosage Given: 2 Gy
Session Number: 37

## 2024-06-11 ENCOUNTER — Ambulatory Visit

## 2024-06-11 ENCOUNTER — Ambulatory Visit
Admission: RE | Admit: 2024-06-11 | Discharge: 2024-06-11 | Disposition: A | Source: Ambulatory Visit | Attending: Radiation Oncology

## 2024-06-11 ENCOUNTER — Other Ambulatory Visit: Payer: Self-pay

## 2024-06-11 DIAGNOSIS — Z510 Encounter for antineoplastic radiation therapy: Secondary | ICD-10-CM | POA: Diagnosis not present

## 2024-06-11 DIAGNOSIS — C61 Malignant neoplasm of prostate: Secondary | ICD-10-CM | POA: Diagnosis not present

## 2024-06-11 DIAGNOSIS — Z191 Hormone sensitive malignancy status: Secondary | ICD-10-CM | POA: Diagnosis not present

## 2024-06-11 LAB — RAD ONC ARIA SESSION SUMMARY
Course Elapsed Days: 56
Plan Fractions Treated to Date: 13
Plan Prescribed Dose Per Fraction: 2 Gy
Plan Total Fractions Prescribed: 15
Plan Total Prescribed Dose: 30 Gy
Reference Point Dosage Given to Date: 26 Gy
Reference Point Session Dosage Given: 2 Gy
Session Number: 38

## 2024-06-12 ENCOUNTER — Other Ambulatory Visit: Payer: Self-pay

## 2024-06-12 ENCOUNTER — Ambulatory Visit
Admission: RE | Admit: 2024-06-12 | Discharge: 2024-06-12 | Disposition: A | Source: Ambulatory Visit | Attending: Radiation Oncology

## 2024-06-12 ENCOUNTER — Ambulatory Visit

## 2024-06-12 DIAGNOSIS — Z191 Hormone sensitive malignancy status: Secondary | ICD-10-CM | POA: Diagnosis not present

## 2024-06-12 DIAGNOSIS — Z510 Encounter for antineoplastic radiation therapy: Secondary | ICD-10-CM | POA: Diagnosis not present

## 2024-06-12 DIAGNOSIS — C61 Malignant neoplasm of prostate: Secondary | ICD-10-CM | POA: Diagnosis not present

## 2024-06-12 LAB — RAD ONC ARIA SESSION SUMMARY
Course Elapsed Days: 57
Plan Fractions Treated to Date: 14
Plan Prescribed Dose Per Fraction: 2 Gy
Plan Total Fractions Prescribed: 15
Plan Total Prescribed Dose: 30 Gy
Reference Point Dosage Given to Date: 28 Gy
Reference Point Session Dosage Given: 2 Gy
Session Number: 39

## 2024-06-13 ENCOUNTER — Other Ambulatory Visit: Payer: Self-pay

## 2024-06-13 ENCOUNTER — Ambulatory Visit
Admission: RE | Admit: 2024-06-13 | Discharge: 2024-06-13 | Disposition: A | Discharge status: Home / Self Care | Source: Ambulatory Visit | Attending: Radiation Oncology | Admitting: Radiation Oncology

## 2024-06-13 DIAGNOSIS — Z510 Encounter for antineoplastic radiation therapy: Secondary | ICD-10-CM | POA: Diagnosis not present

## 2024-06-13 DIAGNOSIS — C61 Malignant neoplasm of prostate: Secondary | ICD-10-CM | POA: Diagnosis not present

## 2024-06-13 DIAGNOSIS — Z191 Hormone sensitive malignancy status: Secondary | ICD-10-CM | POA: Diagnosis not present

## 2024-06-13 LAB — RAD ONC ARIA SESSION SUMMARY
Course Elapsed Days: 58
Plan Fractions Treated to Date: 15
Plan Prescribed Dose Per Fraction: 2 Gy
Plan Total Fractions Prescribed: 15
Plan Total Prescribed Dose: 30 Gy
Reference Point Dosage Given to Date: 30 Gy
Reference Point Session Dosage Given: 2 Gy
Session Number: 40

## 2024-06-18 NOTE — Radiation Completion Notes (Addendum)
  Radiation Oncology         [phone]) [phone] ________________________________  Name: Terry Kemp MRN: [id number]  Date: 06/13/2024  DOB: 1946-04-30  Referring Physician: VALLI SHANK, M.D. Date of Service: 2024-06-18 Radiation Oncologist: Adina Barge, M.D. [facility]     RADIATION ONCOLOGY END OF TREATMENT NOTE     Diagnosis: 78 y.o. gentleman with Stage T1c adenocarcinoma of the prostate with Gleason score of 4+4, and PSA of 3.81.   Intent: Curative     ==========DELIVERED PLANS==========  First Treatment Date: 2024-04-16 Last Treatment Date: 2024-06-13   Plan Name: Prostate_Pelv Site: Prostate Technique: IMRT Mode: Photon Dose Per Fraction: 1.8 Gy Prescribed Dose (Delivered / Prescribed): 18 Gy / 18 Gy Prescribed Fxs (Delivered / Prescribed): 10 / 10   Plan Name: Prostate_Pelv:1 Site: Prostate Technique: IMRT Mode: Photon Dose Per Fraction: 1.8 Gy Prescribed Dose (Delivered / Prescribed): 27 Gy / 27 Gy Prescribed Fxs (Delivered / Prescribed): 15 / 15   Plan Name: Prostate_Bst Site: Prostate Technique: IMRT Mode: Photon Dose Per Fraction: 2 Gy Prescribed Dose (Delivered / Prescribed): 30 Gy / 30 Gy Prescribed Fxs (Delivered / Prescribed): 15 / 15     ==========ON TREATMENT VISIT DATES========== 2024-04-18, 2024-04-25, 2024-05-02, 2024-05-09, 2024-05-15, 2024-05-23, 2024-06-03, 2024-06-13   See weekly On Treatment Notes in Epic for details in the Media tab (listed as Progress notes on the On Treatment Visit Dates listed above).  The patient tolerated the radiation treatments relatively well with mild increased LUTS and modest fatigue.  The patient will receive a call in about one month from the radiation oncology department. He will continue follow up with Dr. SHANK as well.  ------------------------------------------------   Donnice Barge, MD [facility]  Radiation Oncology Direct Dial: [phone]  Fax:  [phone] [email or web address]  Skype  LinkedIn

## 2024-06-27 ENCOUNTER — Other Ambulatory Visit: Payer: Self-pay | Admitting: Urology

## 2024-06-27 DIAGNOSIS — C61 Malignant neoplasm of prostate: Secondary | ICD-10-CM

## 2024-07-01 NOTE — Progress Notes (Signed)
  Radiation Oncology         [phone]) [phone] ________________________________  Name: Terry Kemp MRN: [id number]  Date of Service: 07/15/2024  DOB: 03-Sep-1946  Post Treatment Telephone Note  Diagnosis:  78 y.o. gentleman with Stage T1c adenocarcinoma of the prostate with Gleason score of 4+4, and PSA of 3.81.   First Treatment Date: 2024-04-16 Last Treatment Date: 2024-06-13   Plan Name: Prostate_Pelv Site: Prostate Technique: IMRT Mode: Photon Dose Per Fraction: 1.8 Gy Prescribed Dose (Delivered / Prescribed): 18 Gy / 18 Gy Prescribed Fxs (Delivered / Prescribed): 10 / 10   Plan Name: Prostate_Pelv:1 Site: Prostate Technique: IMRT Mode: Photon Dose Per Fraction: 1.8 Gy Prescribed Dose (Delivered / Prescribed): 27 Gy / 27 Gy Prescribed Fxs (Delivered / Prescribed): 15 / 15   Plan Name: Prostate_Bst Site: Prostate Technique: IMRT Mode: Photon Dose Per Fraction: 2 Gy Prescribed Dose (Delivered / Prescribed): 30 Gy / 30 Gy Prescribed Fxs (Delivered / Prescribed): 15 / 15   Pre Treatment IPSS Score: 18 (as documented in the provider consult note)  The patient was available for call today.   Symptoms of fatigue have improved since completing therapy.  Symptoms of bladder changes have not improved since completing therapy. Current symptoms include frequency and medications for bladder symptoms include N/A.  Symptoms of bowel changes have not improved since completing therapy. Current symptoms include occasional diarrhea and patient is advised to try Imodium.   Post Treatment IPSS Score: IPSS Questionnaire (AUA-7): Over the past month.   1)  How often have you had a sensation of not emptying your bladder completely after you finish urinating?  0 - Not at all  2)  How often have you had to urinate again less than two hours after you finished urinating? 4 - More than half the time  3)  How often have you found you stopped and started again several times when you  urinated?  1 - Less than 1 time in 5  4) How difficult have you found it to postpone urination?  3 - About half the time  5) How often have you had a weak urinary stream?  5 - Almost always  6) How often have you had to push or strain to begin urination?  3 - About half the time  7) How many times did you most typically get up to urinate from the time you went to bed until the time you got up in the morning?  4 - 4 times  Total score:  20. Which indicates severe symptoms  0-7 mildly symptomatic   8-19 moderately symptomatic   20-35 severely symptomatic   Patient does not currently have any scheduled follow up with his urologist to his knowledge so he was advised to call [facility] to schedule his post-treatment follow up with Dr. Valli Shank for ongoing surveillance. He was counseled that PSA levels will be drawn in the urology office, and was reassured that additional time is expected to improve bowel and bladder symptoms. He was encouraged to call back with concerns or questions regarding radiation.

## 2024-07-03 DIAGNOSIS — M75101 Unspecified rotator cuff tear or rupture of right shoulder, not specified as traumatic: Secondary | ICD-10-CM | POA: Diagnosis not present

## 2024-07-03 DIAGNOSIS — M12811 Other specific arthropathies, not elsewhere classified, right shoulder: Secondary | ICD-10-CM | POA: Diagnosis not present

## 2024-07-04 ENCOUNTER — Other Ambulatory Visit: Payer: Self-pay | Admitting: Physician Assistant

## 2024-07-04 DIAGNOSIS — M12811 Other specific arthropathies, not elsewhere classified, right shoulder: Secondary | ICD-10-CM

## 2024-07-04 DIAGNOSIS — M75101 Unspecified rotator cuff tear or rupture of right shoulder, not specified as traumatic: Secondary | ICD-10-CM

## 2024-07-08 DIAGNOSIS — J449 Chronic obstructive pulmonary disease, unspecified: Secondary | ICD-10-CM | POA: Diagnosis not present

## 2024-07-08 DIAGNOSIS — Z6823 Body mass index (BMI) 23.0-23.9, adult: Secondary | ICD-10-CM | POA: Diagnosis not present

## 2024-07-08 DIAGNOSIS — R634 Abnormal weight loss: Secondary | ICD-10-CM | POA: Diagnosis not present

## 2024-07-08 DIAGNOSIS — I69359 Hemiplegia and hemiparesis following cerebral infarction affecting unspecified side: Secondary | ICD-10-CM | POA: Diagnosis not present

## 2024-07-15 ENCOUNTER — Ambulatory Visit
Admission: RE | Admit: 2024-07-15 | Discharge: 2024-07-15 | Disposition: A | Discharge status: Home / Self Care | Source: Ambulatory Visit | Attending: Radiation Oncology | Admitting: Radiation Oncology

## 2024-07-15 DIAGNOSIS — C61 Malignant neoplasm of prostate: Secondary | ICD-10-CM

## 2024-07-16 ENCOUNTER — Ambulatory Visit
Admission: RE | Admit: 2024-07-16 | Discharge: 2024-07-16 | Disposition: A | Discharge status: Home / Self Care | Source: Ambulatory Visit | Attending: Physician Assistant | Admitting: Physician Assistant

## 2024-07-16 DIAGNOSIS — M12811 Other specific arthropathies, not elsewhere classified, right shoulder: Secondary | ICD-10-CM

## 2024-07-16 DIAGNOSIS — M19011 Primary osteoarthritis, right shoulder: Secondary | ICD-10-CM | POA: Diagnosis not present

## 2024-07-17 ENCOUNTER — Other Ambulatory Visit

## 2024-08-11 DIAGNOSIS — Z87891 Personal history of nicotine dependence: Secondary | ICD-10-CM | POA: Diagnosis not present

## 2024-08-11 DIAGNOSIS — R7989 Other specified abnormal findings of blood chemistry: Secondary | ICD-10-CM | POA: Diagnosis not present

## 2024-08-11 DIAGNOSIS — J441 Chronic obstructive pulmonary disease with (acute) exacerbation: Secondary | ICD-10-CM | POA: Diagnosis not present

## 2024-08-11 DIAGNOSIS — I10 Essential (primary) hypertension: Secondary | ICD-10-CM | POA: Diagnosis not present

## 2024-08-11 DIAGNOSIS — R9431 Abnormal electrocardiogram [ECG] [EKG]: Secondary | ICD-10-CM | POA: Diagnosis not present

## 2024-08-12 DIAGNOSIS — J441 Chronic obstructive pulmonary disease with (acute) exacerbation: Secondary | ICD-10-CM | POA: Diagnosis not present

## 2024-08-30 NOTE — Discharge Summary (Signed)
 [facility] Discharge Summary  PCP: C Dale Gull, MD Discharge Details   Admit date:         08/27/2024 Discharge date:        08/30/2024  Hospital LOS:    3 days DIscharge Disposition: COPD exacerbation  Active Hospital Problems   Diagnosis Date Noted POA   *COPD with acute exacerbation (*) 10/15/2023 Yes   Nicotine  dependence 08/27/2024 Yes   COPD exacerbation (*) 10/15/2023 Yes   Essential hypertension 10/15/2023 Yes   Acute on chronic respiratory failure (*) 10/15/2023 Yes   History of CAD (coronary artery disease) 10/15/2023 Not Applicable   History of stroke 04/03/2017 Not Applicable   PAD (peripheral artery disease) 04/16/2014 Yes    Resolved Hospital Problems  No resolved problems to display.      Current Discharge Medication List     START taking these medications      Details  doxycycline hyclate 100 mg tablet Commonly known as: VIBRA-TABS  Take one tablet (100 mg dose) by mouth 2 (two) times daily for 5 days. Quantity: 10 tablet   guaiFENesin  100 mg/5 mL Soln Commonly known as: SM TUSSIN MUCUS,ROBITUSSIN  Take 20 mLs (400 mg dose) by mouth 4 (four) times daily for 7 days. Quantity: 560 mL   predniSONE 20 mg tablet Commonly known as: DELTASONE Start taking on: August 31, 2024  Take two tablets (40 mg dose) by mouth daily for 2 days, THEN one tablet (20 mg dose) daily for 3 days, THEN one half tablet (10 mg dose) daily for 3 days. Quantity: 9 tablet   SPIRIVA RESPIMAT 1.25 MCG/ACT Aers inhaler Generic drug: tiotropium bromide  Inhale two puffs into the lungs daily. Quantity: 4 g       CONTINUE these medications which have CHANGED      Details  * albuterol  sulfate HFA 108 (90 Base) MCG/ACT inhaler Commonly known as: PROVENTIL ,VENTOLIN ,PROAIR  What changed: Another medication with the same name was added. Make sure you understand how and when to take each.  Inhale two puffs into the lungs every 4 (four) hours  as needed for Shortness of Breath or Wheezing.   * albuterol  sulfate 2.5 mg/3 mL nebulizer solution Commonly known as: PROVENTIL  What changed: You were already taking a medication with the same name, and this prescription was added. Make sure you understand how and when to take each.  Take 3 mLs (2.5 mg dose) by nebulization 3 (three) times a day. Quantity: 21 each      * * This list has 2 medication(s) that are the same as other medications prescribed for you. Read the directions carefully, and ask your doctor or other care provider to review them with you.          CONTINUE these medications which have NOT CHANGED      Details  ALTACE  10 MG capsule Generic drug: ramipril   Take one capsule (10 mg dose) by mouth daily.   amLODIPine  besylate 10 mg tablet Commonly known as: NORVASC   Take one tablet (10 mg dose) by mouth daily.   aspirin  EC tablet Commonly known as: ECOTRIN LOW DOSE  Take one tablet (81 mg dose) by mouth daily.   atorvastatin  40 mg tablet Commonly known as: LIPITOR  Take one tablet (40 mg dose) by mouth daily.   busPIRone 10 mg tablet Commonly known as: BUSPAR  Take one tablet (10 mg dose) by mouth 2 (two) times daily.   fluticasone-salmeterol 250-50 mcg/dose Aepb inhalation powder  Commonly known as: ADVAIR DISKUS/WIXELA  Inhale one puff into the lungs 2 (two) times daily.   lamoTRIgine 25 mg tablet Commonly known as: LAMICTAL  Take two tablets (50 mg dose) by mouth daily.   lithium  carbonate 300 mg ER tablet Commonly known as: LITHOBID  Take one tablet (300 mg dose) by mouth daily.   ORGOVYX 120 MG Tabs tablet Generic drug: relugolix  Take one hundred twenty mg by mouth daily.   QUEtiapine fumarate 50 mg tablet Commonly known as: SEROQUEL  Take one tablet (50 mg dose) by mouth at bedtime.   tiotropium bromide 1.25 mcg/actuation inhaler Commonly known as: SPIRIVA RESPIMAT  Inhale two puffs into the lungs daily. Quantity: 4 g   TOPROL  XL 25  MG 24 hr tablet Generic drug: metoprolol  succinate  Take one tablet (25 mg dose) by mouth daily.      * You might also be taking other medications not listed above. If you have questions about any of your other medications, talk to the person who prescribed them or your Primary Care Provider.           Reason for Medication Changes: COPD exacerbation  Hospital Course  Physicians involved in care during this hospitalization Attending Provider: Grayce CHRISTELLA Ada, MD Attending Provider: Darleene Norleen Railing, MD Attending Provider: Deedee Flatten, MD Admitting Provider: Darleene Norleen Railing, MD  Indication for Admission:  COPD with acute exacerbation  History of Present Illness as per H&P:Terry Kemp is a 78 y.o. male who presented to the ED with complaints of increased shortness of breath.  Patient has history of COPD and is on 5 L of oxygen at baseline.  Patient apparently ran out of his medications and started having increasing shortness of breath and wheezing.  EMS provided patient with IV Solu-Medrol  as well as multiple DuoNeb treatments and IV magnesium .  Patient was still having significant shortness of breath on arrival and was switched to BiPAP.  Workup in the emergency department showed that patient was afebrile with normal white blood cells.  Chest x-ray was negative for pneumonia.  COVID and flu and RSV are still pending.   Hospital Course:       Acute on chronic respiratory failure with hypoxia due to COPD exacerbation Patient is much more improved and discharged on 08/30/2024. Patient received IV doxycycline and will be discharged on oral Doxy for another 5 days. Prednisone taper Albuterol  nebulizer 3 times daily for the first week of discharge. Continue home oxygen. Continue Advair and Spiriva Respimat Discussed with the patient that he is likely having a COPD exacerbation from a virus.  I have sent out a pulmonology referral as outpatient for him to have a better control of his  COPD once he is out of the exacerbation phase. Patient has been having lactic acidosis and it was 2.1 on the day of discharge.  He clinically improves a lot and is not septic.  Discussed with patient to make sure oral hydration at home.  Consider recheck lactic acid to assure resolution on discharge follow-up with PCP.    Bedside Procedures     Critical Care    Electronically signed by: Grayce CHRISTELLA Ada, MD on 08/27/24 1926 Status: Completed  Ordering user: Grayce CHRISTELLA Ada, MD 08/27/24 1926 Ordering provider: Grayce CHRISTELLA Ada, MD       [facility]   Discharge Procedure Orders  For Home Use Only DME Nebulizer Machine   Order Specific Question Answer Comments  The face to face evaluation  was performed on: 08/29/2024    For Home Use Only DME Nebulizer Machine   Order Specific Question Answer Comments  The face to face evaluation was performed on: 08/30/2024    Ambulatory referral to Pulmonology  Standing Status: Future  Referral Priority: Routine Referral Type: Consultation  Referral Reason: Evaluate and Return  Requested Specialty: Pulmonary Diseases  Number of Visits Requested: 1 Expiration Date: 02/23/25   Follow-up with Primary Care Physician  Standing Status: Future  Referral Priority: Routine Referral Type: Consultation  Referral Reason: Evaluate and Return  Number of Visits Requested: 1 Expiration Date: 02/25/25   Notify Physician for trouble breathing or symptoms that are worse   Notify physician - Call your doctor if you experience nausea/vomiting   Notify physician - Contact your doctor for excessive bleeding   Notify Physician and CALL 911 if you experience Chest Pain or discomfort, especially if associated with tiredness, sick on stomach or sweaty feeling.   Notify Physician and Call 911 if you experience any of the following: Sudden numbness or weakness, sudden trouble speaking, vision problems, trouble walking, sudden severe headache with no known cause.    Activity as tolerated   Discharge instructions  Order Comments: It was a pleasure caring for you during your hospital stay. We are glad you are doing better and are able to go home.   Please take the medication as instructed.   Please follow up with your Family Doctor within one week.  Follow-up with pulmonology as scheduled.  Return to the hospital if you have fevers greater than 101 degrees not controlled with Tylenol , difficulty breathing, chest pain, palpitations, dizziness or fainting, or for any other concerning symptoms.    Unresulted [employer] Current Status   Culture, Blood Blood Arm, Left Preliminary result   Culture, Blood Blood Arm, Right Preliminary result         Recommendations to physicians:   Followup PCP 1 week  Followup with pulmonology as scheduled  Recommendations for followup labs and/or imaging: Follow-up on COPD management.  Consider recheck next visit to reassure resolution.  Code Status:   Full Code   Time spent in discharge process:  35 minutes  This note was dictated with voice recognition software. Similar sounding words can inadvertently be transcribed and may not be corrected upon review  Electronically signed: Deedee Flatten, MD 08/30/2024 / 12:16 PM *Some images could not be shown.

## 2024-08-30 NOTE — Nursing Note (Signed)
 Reviewed discharge instructions with patient and son at bedside. Nebulizer delivered and instructions were given on how to use. Patient discharged in stable condition via personal vehicle. Son had O2 in car for ride home.

## 2024-09-02 ENCOUNTER — Telehealth: Payer: Self-pay | Admitting: Radiation Oncology

## 2024-09-02 NOTE — Telephone Encounter (Signed)
 12/30 Received call from patient to speak to Dr. Patrcia, he request a consent and/or auth to proceed with his Surgery with Dr. Cristy.  Inbasket sent to [id number] and nursing, so they are aware.

## 2024-09-03 ENCOUNTER — Telehealth: Payer: Self-pay

## 2024-09-03 NOTE — Telephone Encounter (Signed)
 RN faxed letter of to Dr. Raguel office reference to surgery clearance on our end patient is able to proceed.  Terry Kemp was notified.

## 2024-09-19 ENCOUNTER — Encounter: Payer: Self-pay | Admitting: *Deleted

## 2024-11-03 ENCOUNTER — Inpatient Hospital Stay: Admitting: *Deleted
